# Patient Record
Sex: Male | Born: 1945 | Race: Black or African American | Hispanic: No | Marital: Married | Smoking: Former smoker
Health system: Southern US, Community
[De-identification: ages and names within clinical notes are randomized; demographics above are authoritative.]

## PROBLEM LIST (undated history)

## (undated) DIAGNOSIS — Z9289 Personal history of other medical treatment: Secondary | ICD-10-CM

## (undated) DIAGNOSIS — E785 Hyperlipidemia, unspecified: Secondary | ICD-10-CM

## (undated) DIAGNOSIS — K449 Diaphragmatic hernia without obstruction or gangrene: Secondary | ICD-10-CM

## (undated) DIAGNOSIS — R002 Palpitations: Secondary | ICD-10-CM

## (undated) DIAGNOSIS — R32 Unspecified urinary incontinence: Secondary | ICD-10-CM

## (undated) DIAGNOSIS — E119 Type 2 diabetes mellitus without complications: Secondary | ICD-10-CM

## (undated) HISTORY — DX: Type 2 diabetes mellitus without complications: E11.9

## (undated) HISTORY — PX: MENISCUS REPAIR: SHX5179

## (undated) HISTORY — DX: Hyperlipidemia, unspecified: E78.5

## (undated) HISTORY — DX: Diaphragmatic hernia without obstruction or gangrene: K44.9

## (undated) HISTORY — DX: Personal history of other medical treatment: Z92.89

## (undated) HISTORY — DX: Palpitations: R00.2

## (undated) HISTORY — DX: Unspecified urinary incontinence: R32

---
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
## 1974-01-07 HISTORY — PX: APPENDECTOMY: SHX54

## 2000-01-08 HISTORY — PX: ACHILLES TENDON REPAIR: SUR1153

## 2000-07-05 ENCOUNTER — Encounter: Payer: Self-pay | Admitting: Orthopaedic Surgery

## 2000-07-05 ENCOUNTER — Observation Stay (HOSPITAL_COMMUNITY): Admission: EM | Admit: 2000-07-05 | Discharge: 2000-07-06 | Payer: Self-pay | Admitting: *Deleted

## 2000-10-11 ENCOUNTER — Encounter: Payer: Self-pay | Admitting: Family Medicine

## 2000-10-11 ENCOUNTER — Ambulatory Visit (HOSPITAL_COMMUNITY): Admission: RE | Admit: 2000-10-11 | Discharge: 2000-10-11 | Payer: Self-pay | Admitting: Family Medicine

## 2001-02-10 HISTORY — PX: US ECHOCARDIOGRAPHY: HXRAD669

## 2003-10-24 ENCOUNTER — Emergency Department (HOSPITAL_COMMUNITY): Admission: EM | Admit: 2003-10-24 | Discharge: 2003-10-24 | Payer: Self-pay | Admitting: Emergency Medicine

## 2003-11-02 HISTORY — PX: CARDIOVASCULAR STRESS TEST: SHX262

## 2004-04-19 LAB — HM COLONOSCOPY

## 2004-12-10 ENCOUNTER — Encounter (INDEPENDENT_AMBULATORY_CARE_PROVIDER_SITE_OTHER): Payer: Self-pay | Admitting: *Deleted

## 2004-12-10 ENCOUNTER — Ambulatory Visit (HOSPITAL_COMMUNITY): Admission: RE | Admit: 2004-12-10 | Discharge: 2004-12-10 | Payer: Self-pay | Admitting: Gastroenterology

## 2004-12-10 ENCOUNTER — Encounter: Payer: Self-pay | Admitting: Family Medicine

## 2007-11-10 ENCOUNTER — Encounter: Payer: Self-pay | Admitting: Family Medicine

## 2008-04-22 ENCOUNTER — Encounter: Payer: Self-pay | Admitting: Family Medicine

## 2009-01-19 LAB — HM DIABETES EYE EXAM

## 2009-01-19 LAB — HM DIABETES FOOT EXAM

## 2009-03-01 ENCOUNTER — Encounter: Payer: Self-pay | Admitting: Family Medicine

## 2009-08-08 ENCOUNTER — Ambulatory Visit: Payer: Self-pay | Admitting: Family Medicine

## 2009-08-08 DIAGNOSIS — E785 Hyperlipidemia, unspecified: Secondary | ICD-10-CM | POA: Insufficient documentation

## 2009-08-08 DIAGNOSIS — E119 Type 2 diabetes mellitus without complications: Secondary | ICD-10-CM

## 2009-08-08 DIAGNOSIS — R32 Unspecified urinary incontinence: Secondary | ICD-10-CM | POA: Insufficient documentation

## 2009-08-08 HISTORY — DX: Hyperlipidemia, unspecified: E78.5

## 2009-08-08 HISTORY — DX: Type 2 diabetes mellitus without complications: E11.9

## 2009-08-08 HISTORY — DX: Unspecified urinary incontinence: R32

## 2009-08-08 LAB — CONVERTED CEMR LAB
Cholesterol, target level: 200 mg/dL
HDL goal, serum: 40 mg/dL
LDL Goal: 100 mg/dL

## 2009-09-22 ENCOUNTER — Ambulatory Visit: Payer: Self-pay | Admitting: Family Medicine

## 2009-09-28 LAB — CONVERTED CEMR LAB
ALT: 25 units/L (ref 0–53)
AST: 15 units/L (ref 0–37)
Albumin: 4.4 g/dL (ref 3.5–5.2)
Alkaline Phosphatase: 38 units/L — ABNORMAL LOW (ref 39–117)
Bilirubin, Direct: 0.1 mg/dL (ref 0.0–0.3)
Cholesterol: 175 mg/dL (ref 0–200)
Creatinine,U: 182 mg/dL
HDL: 35.9 mg/dL — ABNORMAL LOW (ref >39.00)
Hgb A1c MFr Bld: 6.8 % — ABNORMAL HIGH (ref 4.6–6.5)
LDL Cholesterol: 108 mg/dL — ABNORMAL HIGH (ref 0–99)
Microalb Creat Ratio: 0.3 mg/g (ref 0.0–30.0)
Microalb, Ur: 0.6 mg/dL (ref 0.0–1.9)
Total Bilirubin: 0.4 mg/dL (ref 0.3–1.2)
Total CHOL/HDL Ratio: 5
Total Protein: 7.2 g/dL (ref 6.0–8.3)
Triglycerides: 157 mg/dL — ABNORMAL HIGH (ref 0.0–149.0)
VLDL: 31.4 mg/dL (ref 0.0–40.0)

## 2009-12-04 ENCOUNTER — Ambulatory Visit: Payer: Self-pay | Admitting: Cardiovascular Disease

## 2009-12-04 ENCOUNTER — Encounter: Payer: Self-pay | Admitting: Family Medicine

## 2009-12-18 ENCOUNTER — Ambulatory Visit: Payer: Self-pay | Admitting: Family Medicine

## 2009-12-21 LAB — CONVERTED CEMR LAB
Hgb A1c MFr Bld: 6.8 % — ABNORMAL HIGH (ref 4.6–6.5)
PSA: 0.84 ng/mL (ref 0.10–4.00)

## 2009-12-25 ENCOUNTER — Ambulatory Visit: Payer: Self-pay | Admitting: Family Medicine

## 2010-02-05 ENCOUNTER — Encounter: Payer: Self-pay | Admitting: Family Medicine

## 2010-02-06 NOTE — Letter (Signed)
Summary: Todd [name] at [facility] at [facility]   Imported By: Maryln Gottron 08/23/2009 11:08:58  _____________________________________________________________________  External Attachment:    Type:   Image     Comment:   External Document

## 2010-02-06 NOTE — Letter (Signed)
Summary: Todd [name] at [facility] at [facility]   Imported By: Maryln Gottron 08/23/2009 11:06:00  _____________________________________________________________________  External Attachment:    Type:   Image     Comment:   External Document

## 2010-02-06 NOTE — Letter (Signed)
Summary: [facility]   Imported By: Maryln Gottron 12/08/2009 09:33:53  _____________________________________________________________________  External Attachment:    Type:   Image     Comment:   External Document

## 2010-02-06 NOTE — Assessment & Plan Note (Signed)
Summary: NEW TO EST//ALP   Vital Signs:  Patient profile:   65 year old male Height:      73.75 inches Weight:      212 pounds BMI:     27.50 Temp:     98.4 degrees F oral Pulse rate:   72 / minute Pulse rhythm:   regular Resp:     12 per minute BP sitting:   122 / 84  (left arm) Cuff size:   large  Vitals Entered By: Sid Falcon LPN (August 08, 2009 10:22 AM)  Nutrition Counseling: Patient's BMI is greater than 25 and therefore counseled on weight management options. CC: New to establish, Lipid Management Is Patient Diabetic? Yes Did you bring your meter with you today? No  Vision Screening:      Vision Comments: 01/2010   History of Present Illness: Here to establish care.  Previous pt of mine at [facility]. PMH-Type 2 diabetes, dyslipidemia, and urine urgency.   meds reviewed and compliant with all.  A1C 6.6% in March.  Eye exams yearly.  No hx of retinopathy, neuropathy, nephropathy. No regular exercise.  Has recently joined gym.  No regular glucose monitoring.  Diabetes dxed around 2005.  Hyperlipidemia.  Intolerant of Zocor in past.  takes Zetia and fenofibrate.  Diabetes Management History:      He has not been enrolled in the "Diabetic Education Program".  He states understanding of dietary principles and is following his diet appropriately.  No sensory loss is reported.  Self foot exams are being performed.  He is not checking home blood sugars.  He says that he is not exercising regularly.        Hypoglycemic symptoms are not occurring.  No hyperglycemic symptoms are reported.        There are no symptoms to suggest diabetic complications.    Lipid Management History:      Positive NCEP/ATP III risk factors include male age 64 years old or older and diabetes.  Negative NCEP/ATP III risk factors include no family history for ischemic heart disease, non-tobacco-user status, non-hypertensive, no ASHD (atherosclerotic heart disease), no prior stroke/TIA, no  peripheral vascular disease, and no history of aortic aneurysm.     Preventive Screening-Counseling & Management  Alcohol-Tobacco     Smoking Status: quit     Year Started: 1964     Year Quit: 1973  Caffeine-Diet-Exercise     Does Patient Exercise: no  Allergies (verified): 1)  Actifed Cold/allergy (Chlorpheniramine-Phenylephrine) 2)  Sudafed (Pseudoephedrine Hcl)  Past History:  Past Medical History: Diabetes mellitus, type II Hyperlipidemia Urinary incontinence  Past Surgical History: Appendectomy  1976 Tonsillectomy  1961 L achilles rupture 2002  Family History: Mother, mental, emotional illness Sister Type 2 diabetes  Social History: Occupation:  Research scientist (medical) Married Alcohol use-no Regular exercise-no Occupation:  employed Does Patient Exercise:  no Smoking Status:  quit  Review of Systems  The patient denies anorexia, fever, weight loss, weight gain, vision loss, chest pain, syncope, dyspnea on exertion, peripheral edema, prolonged cough, headaches, hemoptysis, abdominal pain, muscle weakness, depression, and enlarged lymph nodes.    Physical Exam  General:  Well-developed,well-nourished,in no acute distress; alert,appropriate and cooperative throughout examination Head:  Normocephalic and atraumatic without obvious abnormalities. No apparent alopecia or balding. Eyes:  pupils equal, pupils round, and pupils reactive to light.   Ears:  minimal cerumen R canal. Mouth:  Oral mucosa and oropharynx without lesions or exudates.  Teeth in good repair. Neck:  No deformities,  masses, or tenderness noted. Lungs:  Normal respiratory effort, chest expands symmetrically. Lungs are clear to auscultation, no crackles or wheezes. Heart:  normal rate and regular rhythm.   Extremities:  no edema and no foot lesions. Neurologic:  alert & oriented X3, cranial nerves II-XII intact, and strength normal in all extremities.   Skin:  no rashes and no suspicious lesions.     Cervical Nodes:  No lymphadenopathy noted Psych:  normally interactive, good eye contact, not anxious appearing, and not depressed appearing.    Diabetes Management Exam:    Foot Exam (with socks and/or shoes not present):       Sensory-Pinprick/Light touch:          Left medial foot (L-4): normal          Left dorsal foot (L-5): normal          Left lateral foot (S-1): normal          Right medial foot (L-4): normal          Right dorsal foot (L-5): normal          Right lateral foot (S-1): normal       Sensory-Monofilament:          Left foot: normal          Right foot: normal       Inspection:          Left foot: normal          Right foot: normal       Nails:          Left foot: normal          Right foot: normal    Eye Exam:       Eye Exam done elsewhere          Date: 01/11/2009          Results: normal          Done by: dr Elisabeth Most   Impression & Recommendations:  Problem # 1:  DIABETES MELLITUS, TYPE II (ICD-250.00) schedule labs. His updated medication list for this problem includes:    Lisinopril 10 Mg Tabs (Lisinopril) ..... Once daily for blood pressure    Metformin Hcl 500 Mg Tabs (Metformin hcl) ..... Once daily  for diabetes    Aspirin 81 Mg Tabs (Aspirin) ..... Once daily  Problem # 2:  HYPERLIPIDEMIA (ICD-272.4) schedule labs. His updated medication list for this problem includes:    Trilipix 135 Mg Cpdr (Choline fenofibrate) ..... Once daily for cholesterol    Zetia 10 Mg Tabs (Ezetimibe) ..... Once daily for cholesterol  Problem # 3:  URINARY INCONTINENCE (ICD-788.30)  Complete Medication List: 1)  Lisinopril 10 Mg Tabs (Lisinopril) .... Once daily for blood pressure 2)  Trilipix 135 Mg Cpdr (Choline fenofibrate) .... Once daily for cholesterol 3)  Metformin Hcl 500 Mg Tabs (Metformin hcl) .... Once daily  for diabetes 4)  Zetia 10 Mg Tabs (Ezetimibe) .... Once daily for cholesterol 5)  Vesicare 10 Mg Tabs (Solifenacin succinate) .... Once  daily overactive bladder 6)  Aspirin 81 Mg Tabs (Aspirin) .... Once daily 7)  Fiber 625 Mg Tabs (Calcium polycarbophil) .... Once daily  Diabetes Management Assessment/Plan:      The following lipid goals have been established for the patient: Total cholesterol goal of 200; LDL cholesterol goal of 100; HDL cholesterol goal of 40; Triglyceride goal of 150.  His blood pressure goal is < 130/80.  Lipid Assessment/Plan:      Based on NCEP/ATP III, the patient's risk factor category is "history of diabetes".  The patient's lipid goals are as follows: Total cholesterol goal is 200; LDL cholesterol goal is 100; HDL cholesterol goal is 40; Triglyceride goal is 150.    Patient Instructions: 1)  Schedule the following labs in one month: 2)  A1C 250.00 3)  Lipid/hepatic panels  272.4 4)  urine microalbumin  250.00 5)  It is important that you exercise reguarly at least 20 minutes 5 times a week. If you develop chest pain, have severe difficulty breathing, or feel very tired, stop exercising immediately and seek medical attention.  6)  Check your blood sugars regularly. If your readings are usually above:140 fasting  or below 70 you should contact our office.  7)  It is important that your diabetic A1c level is checked every 3 months.  8)  See your eye doctor yearly to check for diabetic eye damage. 9)  Check your feet each night  for sore areas, calluses or signs of infection.   Preventive Care Screening  Colonoscopy:    Date:  12/07/2004    Results:  Adenomatous Polyp     Preventive Care Screening  Colonoscopy:    Date:  12/07/2004    Results:  Adenomatous Polyp

## 2010-02-08 NOTE — Assessment & Plan Note (Signed)
Summary: ROA/FUP/RCD   Vital Signs:  Patient profile:   65 year old male Weight:      210 pounds Temp:     97.8 degrees F oral BP sitting:   130 / 82  (left arm) Cuff size:   large  Vitals Entered By: Sid Falcon LPN (December 25, 2009 8:29 AM) CC: 4 month follow-up, Lipid Management Is Patient Diabetic? Yes   History of Present Illness: Patient here for medical followup. History type 2 diabetes, hyperlipidemia, and urine urgency.  Recent A1c 6.8%. Blood sugars stable. Weight loss since last visit from dietary change. Not exercising regularly. Eye exam due in January.  History adenomatous polyp 2006. Scheduled for followup with her gastroenterology in January.  Urine urgency controlled with Vesicare.  No constipation or dry mouth.   Lipids have been well controlled.  No hx of CAD.  Has already received flu vaccine.  Diabetes Management History:      He has not been enrolled in the "Diabetic Education Program".  He states understanding of dietary principles and is following his diet appropriately.  No sensory loss is reported.  Self foot exams are being performed.  He is not checking home blood sugars.  He says that he is not exercising regularly.        Hypoglycemic symptoms are not occurring.  No hyperglycemic symptoms are reported.        No changes have been made to his treatment plan since last visit.    Lipid Management History:      Positive NCEP/ATP III risk factors include male age 24 years old or older, diabetes, and HDL cholesterol less than 40.  Negative NCEP/ATP III risk factors include no family history for ischemic heart disease, non-tobacco-user status, non-hypertensive, no ASHD (atherosclerotic heart disease), no prior stroke/TIA, no peripheral vascular disease, and no history of aortic aneurysm.     Clinical Review Panels:  Prevention   Last Colonoscopy:  Adenomatous Polyp (12/07/2004)   Last PSA:  0.84 (12/18/2009)  Immunizations   Last Flu Vaccine:   Fluvax 3+ (09/22/2009)   Allergies: 1)  Actifed Cold/allergy (Chlorpheniramine-Phenylephrine) 2)  Sudafed (Pseudoephedrine Hcl)  Past History:  Past Medical History: Last updated: 08/08/2009 Diabetes mellitus, type II Hyperlipidemia Urinary incontinence  Past Surgical History: Last updated: 08/08/2009 Appendectomy  1976 Tonsillectomy  1961 L achilles rupture 2002  Family History: Last updated: 08/08/2009 Mother, mental, emotional illness Sister Type 2 diabetes  Social History: Last updated: 08/08/2009 Occupation:  Research scientist (medical) Married Alcohol use-no Regular exercise-no  Risk Factors: Exercise: no (08/08/2009)  Risk Factors: Smoking Status: quit (08/08/2009)  Review of Systems  The patient denies anorexia, fever, weight gain, vision loss, hoarseness, chest pain, syncope, dyspnea on exertion, peripheral edema, prolonged cough, headaches, hemoptysis, abdominal pain, melena, hematochezia, and severe indigestion/heartburn.    Physical Exam  General:  Well-developed,well-nourished,in no acute distress; alert,appropriate and cooperative throughout examination Eyes:  No corneal or conjunctival inflammation noted. EOMI. Perrla. Funduscopic exam benign, without hemorrhages, exudates or papilledema. Vision grossly normal. Mouth:  Oral mucosa and oropharynx without lesions or exudates.  Teeth in good repair. Neck:  No deformities, masses, or tenderness noted. Lungs:  Normal respiratory effort, chest expands symmetrically. Lungs are clear to auscultation, no crackles or wheezes. Heart:  Normal rate and regular rhythm. S1 and S2 normal without gallop, murmur, click, rub or other extra sounds. Extremities:  No clubbing, cyanosis, edema, or deformity noted with normal full range of motion of all joints.   Psych:  normally interactive, good  eye contact, not anxious appearing, and not depressed appearing.    Diabetes Management Exam:    Foot Exam (with socks and/or shoes not  present):       Sensory-Pinprick/Light touch:          Left medial foot (L-4): normal          Left dorsal foot (L-5): normal          Left lateral foot (S-1): normal          Right medial foot (L-4): normal          Right dorsal foot (L-5): normal          Right lateral foot (S-1): normal       Sensory-Monofilament:          Left foot: normal          Right foot: normal       Inspection:          Left foot: normal          Right foot: normal       Nails:          Left foot: normal          Right foot: normal   Impression & Recommendations:  Problem # 1:  DIABETES MELLITUS, TYPE II (ICD-250.00)  His updated medication list for this problem includes:    Lisinopril 10 Mg Tabs (Lisinopril) ..... Once daily for blood pressure    Metformin Hcl 500 Mg Tabs (Metformin hcl) ..... Once daily  for diabetes    Aspirin 81 Mg Tabs (Aspirin) ..... Once daily  Problem # 2:  HYPERLIPIDEMIA (ICD-272.4)  His updated medication list for this problem includes:    Trilipix 135 Mg Cpdr (Choline fenofibrate) ..... Once daily for cholesterol    Zetia 10 Mg Tabs (Ezetimibe) ..... Once daily for cholesterol  Problem # 3:  URINARY INCONTINENCE (ICD-788.30)  Complete Medication List: 1)  Lisinopril 10 Mg Tabs (Lisinopril) .... Once daily for blood pressure 2)  Trilipix 135 Mg Cpdr (Choline fenofibrate) .... Once daily for cholesterol 3)  Metformin Hcl 500 Mg Tabs (Metformin hcl) .... Once daily  for diabetes 4)  Zetia 10 Mg Tabs (Ezetimibe) .... Once daily for cholesterol 5)  Vesicare 10 Mg Tabs (Solifenacin succinate) .... Once daily overactive bladder 6)  Aspirin 81 Mg Tabs (Aspirin) .... Once daily 7)  Fiber 625 Mg Tabs (Calcium polycarbophil) .... Once daily  Diabetes Management Assessment/Plan:      The following lipid goals have been established for the patient: Total cholesterol goal of 200; LDL cholesterol goal of 100; HDL cholesterol goal of 40; Triglyceride goal of 150.  His blood  pressure goal is < 130/80.    Lipid Assessment/Plan:      Based on NCEP/ATP III, the patient's risk factor category is "history of diabetes".  The patient's lipid goals are as follows: Total cholesterol goal is 200; LDL cholesterol goal is 100; HDL cholesterol goal is 40; Triglyceride goal is 150.    Patient Instructions: 1)  Please schedule a follow-up appointment in 6 months .  2)  It is important that you exercise reguarly at least 20 minutes 5 times a week. If you develop chest pain, have severe difficulty breathing, or feel very tired, stop exercising immediately and seek medical attention.  3)  See your eye doctor yearly to check for diabetic eye damage. 4)  Check your feet each night  for sore areas, calluses or signs of infection.  Orders Added: 1)  Est. Patient Level IV [id number]

## 2010-02-14 NOTE — Letter (Signed)
Summary: Eye Exam/[facility]  Eye Exam/[facility]   Imported By: Maryln Gottron 02/09/2010 13:36:25  _____________________________________________________________________  External Attachment:    Type:   Image     Comment:   External Document

## 2010-02-27 ENCOUNTER — Telehealth: Payer: Self-pay | Admitting: Family Medicine

## 2010-02-27 MED ORDER — METFORMIN HCL 500 MG PO TABS: 500.0000 mg | ORAL_TABLET | Freq: Every day | ORAL | Status: DC

## 2010-02-27 NOTE — Telephone Encounter (Signed)
Pt needs refill for metformin 500mg  - 90 day supply.... [facility] [address]...Marland KitchenMarland KitchenMarland Kitchen # [phone].

## 2010-02-28 ENCOUNTER — Telehealth: Payer: Self-pay | Admitting: Family Medicine

## 2010-02-28 MED ORDER — METFORMIN HCL ER 500 MG PO TB24: 500.0000 mg | ORAL_TABLET | Freq: Every day | ORAL | Status: DC

## 2010-02-28 NOTE — Telephone Encounter (Signed)
Pt called in today and said that Metformin 500mg  (plain) was called in. He stated that he is on the ER 500mg  1qd #90. Please call in the correct one to [facility] on [address]. Please call him when done.

## 2010-04-03 ENCOUNTER — Encounter: Payer: Self-pay | Admitting: Family Medicine

## 2010-04-20 ENCOUNTER — Encounter: Payer: Self-pay | Admitting: Family Medicine

## 2010-04-20 ENCOUNTER — Ambulatory Visit (INDEPENDENT_AMBULATORY_CARE_PROVIDER_SITE_OTHER): Payer: PRIVATE HEALTH INSURANCE | Admitting: Family Medicine

## 2010-04-20 VITALS — BP 122/80 | Temp 98.5°F | Wt 228.0 lb

## 2010-04-20 DIAGNOSIS — IMO0001 Reserved for inherently not codable concepts without codable children: Secondary | ICD-10-CM

## 2010-04-20 DIAGNOSIS — M791 Myalgia, unspecified site: Secondary | ICD-10-CM

## 2010-04-20 LAB — CBC WITH DIFFERENTIAL/PLATELET
Basophils Absolute: 0 10*3/uL (ref 0.0–0.1)
Basophils Relative: 0.4 % (ref 0.0–3.0)
Eosinophils Absolute: 0.1 10*3/uL (ref 0.0–0.7)
Eosinophils Relative: 1.2 % (ref 0.0–5.0)
HCT: 42.4 % (ref 39.0–52.0)
Hemoglobin: 14.4 g/dL (ref 13.0–17.0)
Lymphocytes Relative: 36.7 % (ref 12.0–46.0)
Lymphs Abs: 2.1 10*3/uL (ref 0.7–4.0)
MCHC: 34 g/dL (ref 30.0–36.0)
MCV: 86.9 fl (ref 78.0–100.0)
Monocytes Absolute: 0.6 10*3/uL (ref 0.1–1.0)
Monocytes Relative: 9.6 % (ref 3.0–12.0)
Neutro Abs: 3 10*3/uL (ref 1.4–7.7)
Neutrophils Relative %: 52.1 % (ref 43.0–77.0)
Platelets: 255 10*3/uL (ref 150.0–400.0)
RBC: 4.88 Mil/uL (ref 4.22–5.81)
RDW: 14.3 % (ref 11.5–14.6)
WBC: 5.7 10*3/uL (ref 4.5–10.5)

## 2010-04-20 LAB — POCT SEDIMENTATION RATE: POCT SED RATE: 10 mm/hr (ref 0–22)

## 2010-04-20 LAB — CK: Total CK: 194 U/L (ref 7–232)

## 2010-04-20 NOTE — Progress Notes (Addendum)
  Subjective:    Patient ID: Todd [name], male    DOB: 08-23-1945, 65 y.o.   MRN: [id number]  HPI Agency with diffuse pain upper back and neck radiating toward both shoulders and even upper arms past week. No injury. No radiculopathy symptoms. Describes as a dull ache. No associated weakness. No numbness. Generally poor sleep quality. Felt similar previously with statin medication. Currently takes Avandia and try lipid and a statin. Patient tried coenzyme Q10 without improvement.   Review of Systems  Constitutional: Negative for fever, chills, activity change, appetite change and fatigue.  Respiratory: Negative for cough and shortness of breath.   Cardiovascular: Negative for chest pain, palpitations and leg swelling.  Musculoskeletal: Positive for myalgias. Negative for joint swelling and arthralgias.  Skin: Negative for rash.       Objective:   Physical Exam  Constitutional: He is oriented to person, place, and time. He appears well-developed and well-nourished. No distress.  Neck: Normal range of motion. Neck supple. No thyromegaly present.  Cardiovascular: Normal rate, regular rhythm and normal heart sounds.   No murmur heard. Pulmonary/Chest: Effort normal and breath sounds normal. No respiratory distress. He has no wheezes. He has no rales.  Musculoskeletal: He exhibits no edema.  Lymphadenopathy:    He has no cervical adenopathy.  Neurological: He is alert and oriented to person, place, and time. No cranial nerve deficit.          Assessment & Plan:  Myalgias mostly involving upper back and shoulders and upper arms. Doubt polymyalgia rheumatica but check sedimentation rate. Patient takes no statins but is taking ZE TIA and Trilipix. Check creatinine kinase and CBC. Consider low-dose muscle relaxer if above labs normal  Labs all normal.  Start low dose Flexeril 5 mg po qhs and if no better in 1-2 weeks consider physical therapy.

## 2010-04-24 NOTE — Progress Notes (Signed)
Quick Note:  Pt informed. He requested Dr Caryl Never be informed he is no better...looking for directives of what he could/should do next ______

## 2010-05-20 ENCOUNTER — Other Ambulatory Visit: Payer: Self-pay | Admitting: Family Medicine

## 2010-05-25 NOTE — Op Note (Signed)
[place]  Patient:    Todd [name], Todd [name]                           MRN: [id number] Proc. Date: 07/05/00 Attending:  Claude Manges. Cleophas Dunker, M.D.                           Operative Report  PREOPERATIVE DIAGNOSIS:  Rupture of left Achilles tendon.  POSTOPERATIVE DIAGNOSIS: Rupture of left Achilles tendon.  OPERATION:  Primary repair.  SURGEON:  Claude Manges. Cleophas Dunker, M.D.  ANESTHESIA:  General orotracheal.  COMPLICATIONS:  None.  HISTORY:  A 65 year old gentleman playing basketball this morning felt a burning sensation just above his left heel.  He was seen at [facility] with diagnosis of a ruptured Achilles tendon.  He as sent to the emergency room for my evaluation confirming the diagnosis.  He is now to have primary repair.  DESCRIPTION OF PROCEDURE:  With the patient comfortable on the operating room stretcher and under general orotracheal anesthesia, tourniquet was applied to the left lower extremity.  The patient was then placed on the operating table in the prone position.  The left lower extremity was prepped with DuraPrep from the tips of the toes to the knee.  Sterile dressing was performed.  With the extremity still elevated, it was Esmarch exsanguinated with the proximal tourniquet at 350 mmHg.  A longitudinal incision was made medial to the Achilles tendon approximately 3 to 4 inches in length, centered over the area of rupture which was approximately 2 inches proximal to the attachment to the Achilles and the os calcis.  With sharp dissection, the incision was carried down to the subcutaneous tissue.  I then incised the Achilles tendon sheath longitudinally.  At that point, there was unclotted blood which was irrigated.  The sheath was retracted medially and laterally.  There was approximately an inch gap between the frayed edges.  The plantaris tendon was still intact.  Using a #1 Ethibond suture and using a Tajima  stitch, both the proximal and distal edges were secured.  With the foot in plantar flexion, the two ends were repaired medially and laterally with an excellent repair.  The stitches were buried.  As I would flex the foot to about neutral, there was not obvious tension or elongation of the repair site.  I supplemented the repair site with 2-0 Vicryl circumferentially with a very good technical repair.  The repair was further supplemented with the plantaris tendon.  It was incised proximally, woven through the normal tendon proximal to the incision, and then secured to the distal portion of the Achilles tendon repair on the lateral side with 2-0 Vicryl.  The wound was then irrigated.  There was a fat pad overlying the tendon that was repaired anatomically, and then I repaired the Achilles tendon sheath anatomically with 4-0 Vicryl.  Subcutaneous tissue was closed with 4-0 Vicryl and skin closed with skin clips.  Sterile bulky dressing was applied prior to which 0.25% Marcaine without epinephrine was injected into the edges.  With the foot in plantar flexion, the posterior splint was applied with 5 x 30 splints and Ace bandage.  Tourniquet was deflated in approximately 30 minutes.  The patient tolerated the procedure without complications. DD:  07/05/00 TD:  07/05/00 Job: [id number]

## 2010-05-25 NOTE — Op Note (Signed)
NAME:  Todd [name], Todd [name]                  ACCOUNT NO.:  [id number]   MEDICAL RECORD NO.:  [id number]          PATIENT TYPE:  AMB   LOCATION:  ENDO                         FACILITY:  [facility]   PHYSICIAN:  Anselmo Rod, M.D.  DATE OF BIRTH:  1945-04-02   DATE OF PROCEDURE:  12/10/2004  DATE OF DISCHARGE:                                 OPERATIVE REPORT   PROCEDURE:  Colonoscopy with cold biopsy x 2.   ENDOSCOPIST:  Charna Elizabeth, M.D.   INSTRUMENT USED:  Olympus video colonoscope.   INDICATIONS FOR PROCEDURE:  65 year old African American male undergoing  screening colonoscopy to rule out colonic polyps, masses, etc.   PREPROCEDURE PREPARATION:  Informed consent was obtained from the patient.  The patient was fasted for four hours prior to the procedure and prepped  with Osmo prep pills the night of and the morning of the procedure.  The  risks and benefits of the procedure including a 10% miss rate of cancer and  polyps were discussed with the patient, as well.   PREPROCEDURE PHYSICAL:  Patient with stable vital signs.  Neck supple.  Chest clear to auscultation.  S1 and S2 regular.  Abdomen soft with normal  bowel sounds.   DESCRIPTION OF PROCEDURE:  The patient was placed in the left lateral  decubitus position, sedated with 75 mcg Fentanyl and 7.5 mg Versed in slow  incremental doses.  Once the patient was adequately sedated, maintained on  low flow oxygen and continuous cardiac monitoring, the Olympus video  colonoscope was advanced from the rectum to the cecum.  The appendiceal  orifice and ileocecal valve were visualized and photographed.  The terminal  ileum appeared normal without lesions.  A few early sigmoid diverticula were  seen.  A small sessile polyp was removed by core biopsies x 2 from the  distal right colon.  The rest of the exam was unremarkable.  Retroflexion in  the rectum revealed no abnormalities.  The patient tolerated the procedure  well without immediate  complications.   IMPRESSION:  1.A few early sigmoid diverticula.  2.Small sessile polyp biopsied from distal right colon (core biopsies x 2).  3.Otherwise, normal exam.   RECOMMENDATIONS:  1.Await pathology results.  2.Repeat colonoscopy depending on pathology results.  3.Avoid nonsteroidals including aspirin for the next two weeks.  4.Outpatient follow up as need arises in the future.      Anselmo Rod, M.D.  Electronically Signed     JNM/MEDQ  D:  12/10/2004  T:  12/10/2004  Job:  [id number]   cc:   Teena Irani. Arlyce Dice, M.D.  Fax: [phone]

## 2010-05-31 ENCOUNTER — Other Ambulatory Visit: Payer: Self-pay | Admitting: Family Medicine

## 2010-06-18 ENCOUNTER — Telehealth: Payer: Self-pay | Admitting: Family Medicine

## 2010-06-18 NOTE — Telephone Encounter (Signed)
Go ahead and schedule lipid, hepatic, and A1C.  He had PSA 6 months ago so this does not need repeating yet.

## 2010-06-18 NOTE — Telephone Encounter (Signed)
Pt is sch for fup on 07/04/10. Pt is req to get lab work done prior to visit to test psa, cholesterol,a1c. Pls advise.

## 2010-06-20 NOTE — Telephone Encounter (Signed)
Pt has been called and sch for labs on 06/29/10, as noted.

## 2010-06-25 ENCOUNTER — Ambulatory Visit: Payer: Self-pay | Admitting: Family Medicine

## 2010-06-29 ENCOUNTER — Other Ambulatory Visit (INDEPENDENT_AMBULATORY_CARE_PROVIDER_SITE_OTHER): Payer: PRIVATE HEALTH INSURANCE

## 2010-06-29 DIAGNOSIS — T887XXA Unspecified adverse effect of drug or medicament, initial encounter: Secondary | ICD-10-CM

## 2010-06-29 DIAGNOSIS — IMO0001 Reserved for inherently not codable concepts without codable children: Secondary | ICD-10-CM

## 2010-06-29 DIAGNOSIS — E785 Hyperlipidemia, unspecified: Secondary | ICD-10-CM

## 2010-06-29 LAB — HEPATIC FUNCTION PANEL
ALT: 33 U/L (ref 0–53)
AST: 23 U/L (ref 0–37)
Albumin: 4.3 g/dL (ref 3.5–5.2)
Alkaline Phosphatase: 45 U/L (ref 39–117)
Bilirubin, Direct: 0.1 mg/dL (ref 0.0–0.3)
Total Bilirubin: 0.5 mg/dL (ref 0.3–1.2)
Total Protein: 6.9 g/dL (ref 6.0–8.3)

## 2010-06-29 LAB — LIPID PANEL
Cholesterol: 158 mg/dL (ref 0–200)
HDL: 35.8 mg/dL — ABNORMAL LOW (ref >39.00)
LDL Cholesterol: 98 mg/dL (ref 0–99)
Total CHOL/HDL Ratio: 4
Triglycerides: 123 mg/dL (ref 0.0–149.0)
VLDL: 24.6 mg/dL (ref 0.0–40.0)

## 2010-06-29 LAB — HEMOGLOBIN A1C: Hgb A1c MFr Bld: 7.1 % — ABNORMAL HIGH (ref 4.6–6.5)

## 2010-07-03 ENCOUNTER — Ambulatory Visit: Payer: Self-pay | Admitting: Family Medicine

## 2010-07-04 ENCOUNTER — Encounter: Payer: Self-pay | Admitting: Family Medicine

## 2010-07-04 ENCOUNTER — Ambulatory Visit (INDEPENDENT_AMBULATORY_CARE_PROVIDER_SITE_OTHER): Payer: PRIVATE HEALTH INSURANCE | Admitting: Family Medicine

## 2010-07-04 DIAGNOSIS — E119 Type 2 diabetes mellitus without complications: Secondary | ICD-10-CM

## 2010-07-04 DIAGNOSIS — R32 Unspecified urinary incontinence: Secondary | ICD-10-CM

## 2010-07-04 DIAGNOSIS — E785 Hyperlipidemia, unspecified: Secondary | ICD-10-CM

## 2010-07-04 MED ORDER — FENOFIBRATE 160 MG PO TABS: 160.0000 mg | ORAL_TABLET | Freq: Every day | ORAL | Status: DC

## 2010-07-04 MED ORDER — PRAVASTATIN SODIUM 20 MG PO TABS: 20.0000 mg | ORAL_TABLET | Freq: Every day | ORAL | Status: DC

## 2010-07-04 NOTE — Patient Instructions (Signed)
Stop Zetia and start Pravachol. Stop Vesicare and start Toviaz one daily.

## 2010-07-04 NOTE — Progress Notes (Signed)
  Subjective:    Patient ID: Todd [name], male    DOB: 08/30/1945, 65 y.o.   MRN: [id number]  HPI Followup several medical problems. Type 2 diabetes, hyperlipidemia and urge urine incontinence. Very little effect with VESIcare. Would like to explore other options. No obstructive symptoms. No major constipation or dry mouth issues.  Type 2 diabetes. Recent A1c 7.1%. Does not monitor sugars regularly. No history of complications. Takes metformin 500 mg daily. No consistent exercise.  History of dyslipidemia. Previous intolerance to Lipitor and simvastatin. Currently takes Zetia 10 mg daily. Nonsmoker   Review of Systems  Constitutional: Negative for unexpected weight change.  Respiratory: Negative for cough, shortness of breath and wheezing.   Cardiovascular: Negative for chest pain, palpitations and leg swelling.  Genitourinary: Positive for urgency and frequency. Negative for dysuria and difficulty urinating.  Neurological: Negative for dizziness and headaches.  Hematological: Negative for adenopathy.       Objective:   Physical Exam  Constitutional: He is oriented to person, place, and time. He appears well-developed and well-nourished. No distress.  HENT:  Right Ear: External ear normal.  Left Ear: External ear normal.  Mouth/Throat: Oropharyngeal exudate present.  Neck: Neck supple. No thyromegaly present.  Cardiovascular: Regular rhythm and normal heart sounds.   No murmur heard. Pulmonary/Chest: Effort normal and breath sounds normal. No respiratory distress. He has no wheezes. He has no rales.  Musculoskeletal: He exhibits no edema.  Lymphadenopathy:    He has no cervical adenopathy.  Neurological: He is alert and oriented to person, place, and time. No cranial nerve deficit.  Psychiatric: He has a normal mood and affect. His behavior is normal.          Assessment & Plan:  #1 type 2 diabetes. Recent A1c 7.1%. Work on weight loss and reassess 3 months.  Titrate  metformin if not at goal #2 dyslipidemia. Switch trilipix to generic fenofibrate.  Pravachol and stop Zetia and retest lipids 3 months #3 history of urge urine incontinence. No obstructive symptoms. Discontinue VESIcare. Trial of Toviaz 4 mg at night

## 2010-08-15 ENCOUNTER — Other Ambulatory Visit: Payer: Self-pay | Admitting: Family Medicine

## 2010-08-15 MED ORDER — AZITHROMYCIN 250 MG PO TABS: ORAL_TABLET | ORAL | Status: AC

## 2010-08-15 NOTE — Telephone Encounter (Signed)
Ok to refill once

## 2010-08-15 NOTE — Telephone Encounter (Signed)
Please advise 

## 2010-08-15 NOTE — Telephone Encounter (Signed)
Pt called and is req a renewal of zpak. Pls call in to [facility] in [place], [place] [phone]. Pt is out of town and has chest congestion.

## 2010-08-15 NOTE — Telephone Encounter (Signed)
[facility] [place]

## 2010-09-03 ENCOUNTER — Ambulatory Visit (INDEPENDENT_AMBULATORY_CARE_PROVIDER_SITE_OTHER): Payer: PRIVATE HEALTH INSURANCE | Admitting: Family Medicine

## 2010-09-03 ENCOUNTER — Encounter: Payer: Self-pay | Admitting: Family Medicine

## 2010-09-03 DIAGNOSIS — R202 Paresthesia of skin: Secondary | ICD-10-CM

## 2010-09-03 DIAGNOSIS — IMO0001 Reserved for inherently not codable concepts without codable children: Secondary | ICD-10-CM

## 2010-09-03 DIAGNOSIS — E119 Type 2 diabetes mellitus without complications: Secondary | ICD-10-CM

## 2010-09-03 DIAGNOSIS — R209 Unspecified disturbances of skin sensation: Secondary | ICD-10-CM

## 2010-09-03 DIAGNOSIS — E785 Hyperlipidemia, unspecified: Secondary | ICD-10-CM

## 2010-09-03 DIAGNOSIS — M791 Myalgia, unspecified site: Secondary | ICD-10-CM

## 2010-09-03 NOTE — Progress Notes (Signed)
  Subjective:    Patient ID: Todd [name], male    DOB: Jun 01, 1945, 65 y.o.   MRN: [id number]  HPI Patient here for several issues. Recent muscle aches neck, shoulders, trunk. Started after initiation of medication changes. Initially he thought this was secondary to Toviaz but symptoms did not stop after stopping medication. He recently started pravastatin. Previously intolerant to other statins such as Lipitor.  Patient has hx of urine urgency. Not relieved with VESIcare 10 mg or recent trial of Toviaz.  No obstructive urinary symptoms.  Patient type 2 diabetes. Marginal control. Recent dysesthesias and tingling sensation of feet. No real pain. No weakness. Upcoming labs. No recent TSH or B12.  Past Medical History  Diagnosis Date  . DIABETES MELLITUS, TYPE II 08/08/2009  . HYPERLIPIDEMIA 08/08/2009  . URINARY INCONTINENCE 08/08/2009   Past Surgical History  Procedure Date  . Appendectomy 1976  . Tonsillectomy 1961  . Achilles tendon repair 2002    rupture    reports that he quit smoking about 33 years ago. His smoking use included Cigarettes. He has a 20 pack-year smoking history. He does not have any smokeless tobacco history on file. His alcohol and drug histories not on file. family history includes Diabetes in his sister and Mental illness in his mother. Allergies  Allergen Reactions  . Actifed Cold-Sinus     rash  . Pseudoephedrine     REACTION: hives      Review of Systems  Constitutional: Negative for fever, chills and appetite change.  Eyes: Negative for visual disturbance.  Respiratory: Negative for shortness of breath.   Cardiovascular: Negative for chest pain, palpitations and leg swelling.  Musculoskeletal: Positive for myalgias. Negative for joint swelling.  Neurological: Positive for numbness. Negative for dizziness and weakness.  Hematological: Negative for adenopathy.       Objective:   Physical Exam  Constitutional: He is oriented to person, place, and time.  He appears well-developed and well-nourished.  Neck: Neck supple.  Cardiovascular: Normal rate, regular rhythm and normal heart sounds.   No murmur heard. Pulmonary/Chest: Effort normal and breath sounds normal. No respiratory distress. He has no wheezes. He has no rales.  Musculoskeletal: He exhibits no edema.  Lymphadenopathy:    He has no cervical adenopathy.  Neurological: He is alert and oriented to person, place, and time.          Assessment & Plan:  #1 urinary urgency. Discontinue VESIcare. 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 Add TSH, B12 to upcoming labs #3 myalgias. Probably secondary to pravastatin.  Hold pravastatin and reassess at followup #4 hypertension stable

## 2010-09-03 NOTE — Patient Instructions (Signed)
Enablex one daily Hold Pravastatin until follow up.

## 2010-09-14 ENCOUNTER — Other Ambulatory Visit: Payer: Self-pay | Admitting: Family Medicine

## 2010-10-01 ENCOUNTER — Other Ambulatory Visit (INDEPENDENT_AMBULATORY_CARE_PROVIDER_SITE_OTHER): Payer: PRIVATE HEALTH INSURANCE

## 2010-10-01 DIAGNOSIS — L4050 Arthropathic psoriasis, unspecified: Secondary | ICD-10-CM

## 2010-10-01 DIAGNOSIS — N390 Urinary tract infection, site not specified: Secondary | ICD-10-CM

## 2010-10-01 DIAGNOSIS — R202 Paresthesia of skin: Secondary | ICD-10-CM

## 2010-10-01 DIAGNOSIS — E785 Hyperlipidemia, unspecified: Secondary | ICD-10-CM

## 2010-10-01 DIAGNOSIS — R209 Unspecified disturbances of skin sensation: Secondary | ICD-10-CM

## 2010-10-01 DIAGNOSIS — E119 Type 2 diabetes mellitus without complications: Secondary | ICD-10-CM

## 2010-10-01 LAB — HEPATIC FUNCTION PANEL
ALT: 24 U/L (ref 0–53)
AST: 17 U/L (ref 0–37)
Albumin: 4.1 g/dL (ref 3.5–5.2)
Alkaline Phosphatase: 45 U/L (ref 39–117)
Bilirubin, Direct: 0.1 mg/dL (ref 0.0–0.3)
Total Bilirubin: 0.5 mg/dL (ref 0.3–1.2)
Total Protein: 6.9 g/dL (ref 6.0–8.3)

## 2010-10-01 LAB — PSA: PSA: 0.86 ng/mL (ref 0.10–4.00)

## 2010-10-01 LAB — LIPID PANEL
Cholesterol: 159 mg/dL (ref 0–200)
HDL: 33.9 mg/dL — ABNORMAL LOW (ref >39.00)
LDL Cholesterol: 101 mg/dL — ABNORMAL HIGH (ref 0–99)
Total CHOL/HDL Ratio: 5
Triglycerides: 123 mg/dL (ref 0.0–149.0)
VLDL: 24.6 mg/dL (ref 0.0–40.0)

## 2010-10-01 LAB — POCT URINALYSIS DIPSTICK
Bilirubin, UA: NEGATIVE
Blood, UA: NEGATIVE
Glucose, UA: NEGATIVE
Ketones, UA: NEGATIVE
Leukocytes, UA: NEGATIVE
Nitrite, UA: NEGATIVE
Protein, UA: NEGATIVE
Spec Grav, UA: 1.02
Urobilinogen, UA: 0.2
pH, UA: 5.5

## 2010-10-01 LAB — VITAMIN B12: Vitamin B-12: 448 pg/mL (ref 211–911)

## 2010-10-01 LAB — TSH: TSH: 1.53 u[IU]/mL (ref 0.35–5.50)

## 2010-10-01 LAB — HEMOGLOBIN A1C: Hgb A1c MFr Bld: 6.8 % — ABNORMAL HIGH (ref 4.6–6.5)

## 2010-10-08 ENCOUNTER — Ambulatory Visit (INDEPENDENT_AMBULATORY_CARE_PROVIDER_SITE_OTHER): Payer: PRIVATE HEALTH INSURANCE | Admitting: Family Medicine

## 2010-10-08 ENCOUNTER — Encounter: Payer: Self-pay | Admitting: Family Medicine

## 2010-10-08 VITALS — BP 122/78 | Temp 98.1°F | Wt 214.0 lb

## 2010-10-08 DIAGNOSIS — M25519 Pain in unspecified shoulder: Secondary | ICD-10-CM

## 2010-10-08 DIAGNOSIS — M25512 Pain in left shoulder: Secondary | ICD-10-CM

## 2010-10-08 DIAGNOSIS — IMO0001 Reserved for inherently not codable concepts without codable children: Secondary | ICD-10-CM

## 2010-10-08 DIAGNOSIS — M791 Myalgia, unspecified site: Secondary | ICD-10-CM

## 2010-10-08 DIAGNOSIS — M25511 Pain in right shoulder: Secondary | ICD-10-CM

## 2010-10-08 DIAGNOSIS — Z23 Encounter for immunization: Secondary | ICD-10-CM

## 2010-10-08 DIAGNOSIS — R32 Unspecified urinary incontinence: Secondary | ICD-10-CM

## 2010-10-08 NOTE — Patient Instructions (Signed)
Get back on pravastatin Discontinue Enablex and restart VESIcare 10 mg daily Schedule followup with urologist and if you have any difficulties getting appointment let us know and we can assist

## 2010-10-08 NOTE — Progress Notes (Signed)
  Subjective:    Patient ID: Todd [name], male    DOB: Nov 14, 1945, 65 y.o.   MRN: [id number]  HPI Patient seen for medical followup. Recent lab work unremarkable. Myalgias did not decrease with stopping pravastatin. Thyroid and B12 normal.  Patient still has some urine frequency. We tried Enablex and he previously tried VESIcare without much improvement. He denies any burning with urination. No caffeine use. No significant alcohol use. No obstructive urinary symptoms. Saw a urologist about 2 years ago who placed him initially on VESIcare. No hematuria.  Complains of some bilateral shoulder pains intermittently. Occasional night pain. No weakness. Minimal pain with internal rotation and abduction. Not limiting range of motion or activity   Review of Systems  Constitutional: Negative for fever, chills and unexpected weight change.  Respiratory: Negative for shortness of breath.   Cardiovascular: Negative for chest pain.  Gastrointestinal: Negative for abdominal pain.  Genitourinary: Positive for urgency. Negative for hematuria, decreased urine volume, difficulty urinating and testicular pain.  Neurological: Negative for dizziness.       Objective:   Physical Exam  Constitutional: He is oriented to person, place, and time. He appears well-developed and well-nourished.  Cardiovascular: Normal rate and regular rhythm.   Pulmonary/Chest: Effort normal and breath sounds normal. No respiratory distress. He has no wheezes. He has no rales.  Musculoskeletal: He exhibits no edema.       Full range of motion both shoulders. No tenderness. No muscle atrophy. No rotator cuff weakness. Minimal pain with abduction against resistance  Neurological: He is alert and oriented to person, place, and time.          Assessment & Plan:  #1 urine urgency. Not improved with medication. Discussed possible addition of other anticholinergics such as low-dose Elavil but at this point he wishes to wait. He'll  reschedule with urologist #2 myalgias. Not improved after discontinuation of statin.  Start back pravastatin #3 bilateral shoulder pain. Suspect rotator cuff tendinitis, mild. Reassurance.

## 2010-11-09 ENCOUNTER — Other Ambulatory Visit: Payer: Self-pay | Admitting: Family Medicine

## 2010-12-21 ENCOUNTER — Other Ambulatory Visit: Payer: Self-pay | Admitting: Family Medicine

## 2011-01-11 ENCOUNTER — Telehealth: Payer: Self-pay | Admitting: Family Medicine

## 2011-01-11 MED ORDER — AZITHROMYCIN 250 MG PO TABS: ORAL_TABLET | ORAL | Status: DC

## 2011-01-11 NOTE — Telephone Encounter (Signed)
Okay to call in Z-Pak but patient needs followup next week if not improving

## 2011-01-11 NOTE — Telephone Encounter (Signed)
Earlier phone note with request was OKed by Dr Caryl Never, however he signed off on note.  Rx filled from this note

## 2011-01-11 NOTE — Telephone Encounter (Signed)
Todd [name] please call zpack into [facility] [phone]

## 2011-01-11 NOTE — Telephone Encounter (Signed)
Pt requesting a Zpack be called in. States that he has no fever, just a sore throat and cough. States that the same thing happened over the summer and that this was the course of treatment. Please call to [facility] on [address] or call pt to advise. Thanks.

## 2011-01-11 NOTE — Telephone Encounter (Signed)
Please advise 

## 2011-01-18 ENCOUNTER — Encounter: Payer: Self-pay | Admitting: Family Medicine

## 2011-01-18 ENCOUNTER — Ambulatory Visit (INDEPENDENT_AMBULATORY_CARE_PROVIDER_SITE_OTHER): Payer: PRIVATE HEALTH INSURANCE | Admitting: Family Medicine

## 2011-01-18 VITALS — BP 120/70 | Temp 98.4°F | Wt 225.0 lb

## 2011-01-18 DIAGNOSIS — R059 Cough, unspecified: Secondary | ICD-10-CM

## 2011-01-18 DIAGNOSIS — R05 Cough: Secondary | ICD-10-CM

## 2011-01-18 MED ORDER — HYDROCODONE-HOMATROPINE 5-1.5 MG/5ML PO SYRP: 5.0000 mL | ORAL_SOLUTION | Freq: Four times a day (QID) | ORAL | Status: AC | PRN

## 2011-01-18 NOTE — Patient Instructions (Signed)
Please call if any fever or worsening cough

## 2011-01-18 NOTE — Progress Notes (Signed)
  Subjective:    Patient ID: Notnamed Scholz, male    DOB: Feb 05, 1945, 66 y.o.   MRN: [id number]  HPI  Patient seen with a cough for the past couple weeks. Zithromax called but has not seen improvement. Has associated fatigue. No fever or chills. No dyspnea. No hemoptysis. Minimal nasal congestion. Has not taken any over-the-counter medications. Nonsmoker. Quit 1979. No history of asthma. No wheezing. No GERD symptoms. Cough symptoms are moderate   Review of Systems As per history of present illness    Objective:   Physical Exam  Constitutional: He appears well-developed and well-nourished.  HENT:  Right Ear: External ear normal.  Left Ear: External ear normal.  Mouth/Throat: Oropharynx is clear and moist.  Neck: Neck supple.  Cardiovascular: Normal rate and regular rhythm.   Pulmonary/Chest: Effort normal and breath sounds normal. No respiratory distress. He has no wheezes. He has no rales.  Lymphadenopathy:    He has no cervical adenopathy.          Assessment & Plan:  Acute bronchitis. Suspect viral. Hycodan as needed for cough. Follow up promptly for fever or worsening symptoms.

## 2011-01-21 ENCOUNTER — Other Ambulatory Visit: Payer: Self-pay | Admitting: Family Medicine

## 2011-01-30 ENCOUNTER — Other Ambulatory Visit: Payer: Self-pay | Admitting: Family Medicine

## 2011-01-31 ENCOUNTER — Telehealth: Payer: Self-pay | Admitting: Family Medicine

## 2011-01-31 NOTE — Telephone Encounter (Signed)
Please advise 

## 2011-01-31 NOTE — Telephone Encounter (Signed)
Pt called and said that he had been discussing a new med called Oxybutynin SR with pcp and pt is req a script to be called in to [facility] on [address] at [facility].

## 2011-02-01 MED ORDER — OXYBUTYNIN CHLORIDE ER 10 MG PO TB24: 10.0000 mg | ORAL_TABLET | Freq: Every day | ORAL | Status: DC

## 2011-02-01 NOTE — Telephone Encounter (Signed)
Pt informed Rx sent. 

## 2011-02-01 NOTE — Telephone Encounter (Signed)
Start Oxybutynin SR 10 mg po once daily, disp #30 with 6 refills and may call in to pharmacy requested.

## 2011-02-05 LAB — HM DIABETES EYE EXAM

## 2011-02-07 ENCOUNTER — Other Ambulatory Visit: Payer: Self-pay | Admitting: Family Medicine

## 2011-02-07 NOTE — Telephone Encounter (Signed)
May refill once and needs to be seen if no better after that.

## 2011-02-07 NOTE — Telephone Encounter (Signed)
Last given on 01-11-11 per request on phone note, OV 1-11, ongoing cough Rx Hycodan Please advise

## 2011-02-09 NOTE — Telephone Encounter (Signed)
Please advise 

## 2011-02-10 NOTE — Telephone Encounter (Signed)
Already answered on 02-07-11   Not sure why this came back to me.

## 2011-02-14 ENCOUNTER — Encounter: Payer: Self-pay | Admitting: Family Medicine

## 2011-02-28 ENCOUNTER — Ambulatory Visit (INDEPENDENT_AMBULATORY_CARE_PROVIDER_SITE_OTHER): Payer: PRIVATE HEALTH INSURANCE | Admitting: Cardiovascular Disease

## 2011-02-28 ENCOUNTER — Encounter: Payer: Self-pay | Admitting: Cardiovascular Disease

## 2011-02-28 VITALS — BP 141/81 | HR 68 | Ht 74.0 in | Wt 220.0 lb

## 2011-02-28 DIAGNOSIS — E785 Hyperlipidemia, unspecified: Secondary | ICD-10-CM

## 2011-02-28 DIAGNOSIS — E119 Type 2 diabetes mellitus without complications: Secondary | ICD-10-CM

## 2011-02-28 NOTE — Assessment & Plan Note (Signed)
Todd [name] is  doing very well. He's not had any episodes of chest pain or shortness breath. He's been tolerating his costal medicines quite well. His last lipids are acceptable.  I'll see him again in one year. We'll plan on getting a fasting lipid profile at that time.

## 2011-02-28 NOTE — Progress Notes (Signed)
Todd [name] Date of Birth  11/15/1945 [facility]     [place] [facility]  [address]    [address]   [address] [place], [place]  [address]    [place], [place]  [address] [phone]  Fax  [phone]  [phone]  Fax [phone]  Problem list: 1. Hypercholesterolemia 2. Palpitations 3. History of chest pain- normal stress Myoview study in October, 2005 4. Diabetes mellitus   History of Present Illness:  Todd [name] is a 66 year old gentleman with a history as noted above. I've seen him once here for the past several years. He is remaining quite active. He is fairly healthy. He's been exercising on a fairly regular basis.  He denies episodes of chest pain or shortness of breath.  Current Outpatient Prescriptions on File Prior to Visit  Medication Sig Dispense Refill  . aspirin 81 MG tablet Take 81 mg by mouth daily.        Marland Kitchen co-enzyme Q-10 30 MG capsule Take 100 mg by mouth daily.       . fenofibrate 160 MG tablet Take 1 tablet (160 mg total) by mouth daily.  30 tablet  11  . lisinopril (PRINIVIL,ZESTRIL) 10 MG tablet TAKE ONE TABLET BY MOUTH DAILY FOR BLOOD PRESSURE  90 tablet  0  . metFORMIN (GLUCOPHAGE-XR) 500 MG 24 hr tablet TAKE 1 TABLET BY MOUTH DAILY WITH BREAKFAST  90 tablet  1  . oxybutynin (DITROPAN-XL) 10 MG 24 hr tablet Take 1 tablet (10 mg total) by mouth daily.  30 tablet  6  . pravastatin (PRAVACHOL) 20 MG tablet Take 1 tablet (20 mg total) by mouth daily.  30 tablet  11    Allergies  Allergen Reactions  . Actifed Cold-Sinus     rash  . Pseudoephedrine     REACTION: hives    Past Medical History  Diagnosis Date  . DIABETES MELLITUS, TYPE II 08/08/2009  . HYPERLIPIDEMIA 08/08/2009  . URINARY INCONTINENCE 08/08/2009    Past Surgical History  Procedure Date  . Appendectomy 1976  . Tonsillectomy 1961  . Achilles tendon repair 2002    rupture    History  Smoking status  . Former Smoker -- 2.0 packs/day for 10 years  . Types: Cigarettes    . Quit date: 04/19/1977  Smokeless tobacco  . Not on file    History  Alcohol Use: Not on file    Family History  Problem Relation Age of Onset  . Mental illness Mother   . Diabetes Sister     type ll    Reviw of Systems:  Reviewed in the HPI.  All other systems are negative.  Physical Exam: Blood pressure 141/81, pulse 68, height 6\' 2"  (1.88 m), weight 220 lb (99.791 kg). General: Well developed, well nourished, in no acute distress.  Head: Normocephalic, atraumatic, sclera non-icteric, mucus membranes are moist,   Neck: Supple. Negative for carotid bruits. JVD not elevated.  Lungs: Clear bilaterally to auscultation without wheezes, rales, or rhonchi. Breathing is normal.  Heart: RRR with S1 S2. No murmurs, rubs, or gallops  Abdomen: Soft, non-tender, non-distended with normal bowel sounds. No hepatomegaly. No rebound/guarding. No masses.  Msk:  Strength and tone appear normal for age.  Extremities: No clubbing or cyanosis. No edema.  Distal pedal pulses are 2+ and equal bilaterally.  Neuro: Alert and oriented X 3. Moves all extremities spontaneously.  Psych:  Responds to questions appropriately with a normal affect.  ECG: Normal sinus rhythm. Normal  EKG.  Assessment / Plan:

## 2011-02-28 NOTE — Patient Instructions (Addendum)
Your physician wants you to follow-up in: 1 year with Dr. Elease Hashimoto.  You will receive a reminder letter in the mail two months in advance. If you don't receive a letter, please call our office to schedule the follow-up appointment.  Your physician recommends that you return for lab work in: 1 year.  BMET, HFP, LIPID Panel.

## 2011-03-13 ENCOUNTER — Other Ambulatory Visit: Payer: Self-pay | Admitting: Internal Medicine

## 2011-03-14 NOTE — Telephone Encounter (Signed)
Dr burchette pt 

## 2011-03-20 ENCOUNTER — Encounter: Payer: Self-pay | Admitting: *Deleted

## 2011-04-08 ENCOUNTER — Other Ambulatory Visit (INDEPENDENT_AMBULATORY_CARE_PROVIDER_SITE_OTHER): Payer: PRIVATE HEALTH INSURANCE

## 2011-04-08 DIAGNOSIS — E785 Hyperlipidemia, unspecified: Secondary | ICD-10-CM

## 2011-04-08 LAB — BASIC METABOLIC PANEL
BUN: 17 mg/dL (ref 6–23)
CO2: 29 mEq/L (ref 19–32)
Calcium: 9.5 mg/dL (ref 8.4–10.5)
Chloride: 104 mEq/L (ref 96–112)
Creatinine, Ser: 1.1 mg/dL (ref 0.4–1.5)
GFR: 90.3 mL/min (ref >60.00)
Glucose, Bld: 120 mg/dL — ABNORMAL HIGH (ref 70–99)
Potassium: 4.1 mEq/L (ref 3.5–5.1)
Sodium: 141 mEq/L (ref 135–145)

## 2011-04-08 LAB — HEPATIC FUNCTION PANEL
ALT: 32 U/L (ref 0–53)
AST: 21 U/L (ref 0–37)
Albumin: 4.5 g/dL (ref 3.5–5.2)
Alkaline Phosphatase: 44 U/L (ref 39–117)
Bilirubin, Direct: 0.1 mg/dL (ref 0.0–0.3)
Total Bilirubin: 0.3 mg/dL (ref 0.3–1.2)
Total Protein: 7.4 g/dL (ref 6.0–8.3)

## 2011-04-08 LAB — LIPID PANEL
Cholesterol: 158 mg/dL (ref 0–200)
HDL: 41 mg/dL (ref >39.00)
LDL Cholesterol: 91 mg/dL (ref 0–99)
Total CHOL/HDL Ratio: 4
Triglycerides: 129 mg/dL (ref 0.0–149.0)
VLDL: 25.8 mg/dL (ref 0.0–40.0)

## 2011-04-09 ENCOUNTER — Telehealth: Payer: Self-pay | Admitting: Cardiovascular Disease

## 2011-04-09 NOTE — Telephone Encounter (Signed)
Close  

## 2011-04-15 ENCOUNTER — Encounter: Payer: Self-pay | Admitting: Family Medicine

## 2011-04-15 ENCOUNTER — Telehealth: Payer: Self-pay | Admitting: Family Medicine

## 2011-04-15 ENCOUNTER — Ambulatory Visit (INDEPENDENT_AMBULATORY_CARE_PROVIDER_SITE_OTHER): Payer: PRIVATE HEALTH INSURANCE | Admitting: Family Medicine

## 2011-04-15 VITALS — BP 120/70 | Temp 97.7°F | Wt 216.0 lb

## 2011-04-15 DIAGNOSIS — E785 Hyperlipidemia, unspecified: Secondary | ICD-10-CM

## 2011-04-15 DIAGNOSIS — E119 Type 2 diabetes mellitus without complications: Secondary | ICD-10-CM

## 2011-04-15 NOTE — Telephone Encounter (Signed)
Pt was told to come back for 6 month fup and pt said that he needs to get labs done prior to ov, but there was no future labs ordered. Pls order any labs needed for ov and advise.

## 2011-04-15 NOTE — Telephone Encounter (Signed)
Pt here for OV today.  

## 2011-04-15 NOTE — Progress Notes (Signed)
  Subjective:    Patient ID: Todd [name], male    DOB: [birth year], 66 y.o.   MRN: [id number]  HPI  Medical followup. Patient has history of type 2 diabetes, hyperlipidemia, and hypertension. Blood pressure been stable. No orthostasis. Recent lipids are viewed and improved. LDL at goal. Triglycerides normal. Remains on pravastatin. No myalgias.  Type 2 diabetes. A1c was not drawn with recent labs. Not monitoring blood sugars. Diabetes been well controlled for several years. No symptoms of hyperglycemia. He does have some urinary urgency and new medication per her urologist recently and tolerating well.  Past Medical History  Diagnosis Date  . DIABETES MELLITUS, TYPE II 08/08/2009  . HYPERLIPIDEMIA 08/08/2009  . URINARY INCONTINENCE 08/08/2009  . Palpitations   . Hiatal hernia    Past Surgical History  Procedure Date  . Appendectomy 1976  . Tonsillectomy 1961  . Achilles tendon repair 2002    rupture  . US echocardiography 02-10-2001    EF 60-65%  . Cardiovascular stress test 11-02-2003    EF 57%    reports that he quit smoking about 34 years ago. His smoking use included Cigarettes. He has a 20 pack-year smoking history. He does not have any smokeless tobacco history on file. His alcohol and drug histories not on file. family history includes Diabetes in his sister and Mental illness in his mother. Allergies  Allergen Reactions  . Actifed Cold-Sinus     rash  . Pseudoephedrine     REACTION: hives     Review of Systems  Constitutional: Negative for fatigue.  Eyes: Negative for visual disturbance.  Respiratory: Negative for cough, chest tightness and shortness of breath.   Cardiovascular: Negative for chest pain, palpitations and leg swelling.  Neurological: Negative for dizziness, syncope, weakness, light-headedness and headaches.       Objective:   Physical Exam  Constitutional: He appears well-developed and well-nourished.  HENT:  Right Ear: External ear normal.  Left  Ear: External ear normal.  Mouth/Throat: Oropharynx is clear and moist.  Neck: Neck supple. No thyromegaly present.  Cardiovascular: Normal rate and regular rhythm.   Pulmonary/Chest: Effort normal and breath sounds normal. No respiratory distress. He has no wheezes. He has no rales.  Musculoskeletal: He exhibits no edema.  Lymphadenopathy:    He has no cervical adenopathy.          Assessment & Plan:  #1 hypertension stable continue current medications #2 history of dyslipidemia. Lipids improved. Continue current medications  #3 type 2 diabetes. Patient needs A1c at followup. We have suggested monitoring fasting sugars at least once or twice weekly. Exercise discussed.

## 2011-05-21 ENCOUNTER — Telehealth: Payer: Self-pay | Admitting: Family Medicine

## 2011-05-21 NOTE — Telephone Encounter (Signed)
Observe off any blood pressure medications for now as recent blood pressure was well-controlled. He would not be a candidate for angiotensin receptor blocker with possible angioedema with ACE inhibitor. We need to make sure he is office visit within 2-3 months to reassess

## 2011-05-21 NOTE — Telephone Encounter (Signed)
Pt stated he had reaction to lisinopril lips swelling. Pt decline ov. Pt would like a different medication. [facility] [phone]

## 2011-05-21 NOTE — Telephone Encounter (Signed)
Pt informed, OV scheduled.

## 2011-05-21 NOTE — Telephone Encounter (Signed)
Pt is sch for 7-19 8.30am

## 2011-06-27 ENCOUNTER — Encounter: Payer: Self-pay | Admitting: Family Medicine

## 2011-06-27 ENCOUNTER — Ambulatory Visit (INDEPENDENT_AMBULATORY_CARE_PROVIDER_SITE_OTHER): Payer: PRIVATE HEALTH INSURANCE | Admitting: Family Medicine

## 2011-06-27 VITALS — BP 128/70 | Temp 98.5°F | Wt 218.0 lb

## 2011-06-27 DIAGNOSIS — T783XXA Angioneurotic edema, initial encounter: Secondary | ICD-10-CM

## 2011-06-27 DIAGNOSIS — R079 Chest pain, unspecified: Secondary | ICD-10-CM

## 2011-06-27 DIAGNOSIS — E119 Type 2 diabetes mellitus without complications: Secondary | ICD-10-CM

## 2011-06-27 LAB — HEMOGLOBIN A1C: Hgb A1c MFr Bld: 6.7 % — ABNORMAL HIGH (ref 4.6–6.5)

## 2011-06-27 NOTE — Progress Notes (Signed)
  Subjective:    Patient ID: Todd [name], male    DOB: 05/11/1945, 66 y.o.   MRN: [id number]  HPI  Patient seen for medical followup. History of type 2 diabetes, hyperlipidemia, and urinary incontinence. He was on low-dose lisinopril 10 mg daily until one month ago-presumably for renal protection for his Type 2 DM. He developed some angioedema mostly involving the lips. He was aware of potential association with ACE inhibitors and stopped medication promptly. Symptoms resolved and he has not had any recurrence since then. No history of food allergies. His blood pressures been fairly stable mostly around 130 systolic over 80 diastolic. No headaches or dizziness.  4-5 days ago noticed some vague chest symptoms. Dull chest pain which is not related to exertion. He denies any dyspnea. No radiation of symptoms to the arm or neck. His symptoms of pain are mild in severity and no clear triggering factors. No alleviating factors.  Past Medical History  Diagnosis Date  . DIABETES MELLITUS, TYPE II 08/08/2009  . HYPERLIPIDEMIA 08/08/2009  . URINARY INCONTINENCE 08/08/2009  . Palpitations   . Hiatal hernia    Past Surgical History  Procedure Date  . Appendectomy 1976  . Tonsillectomy 1961  . Achilles tendon repair 2002    rupture  . US echocardiography 02-10-2001    EF 60-65%  . Cardiovascular stress test 11-02-2003    EF 57%    reports that he quit smoking about 34 years ago. His smoking use included Cigarettes. He has a 20 pack-year smoking history. He does not have any smokeless tobacco history on file. His alcohol and drug histories not on file. family history includes Diabetes in his sister and Mental illness in his mother. Allergies  Allergen Reactions  . Actifed Cold-Sinus     rash  . Lisinopril Swelling  . Pseudoephedrine     REACTION: hives      Review of Systems  Constitutional: Negative for fever, chills, appetite change, fatigue and unexpected weight change.  HENT: Negative for  trouble swallowing.   Respiratory: Negative for cough and shortness of breath.   Cardiovascular: Positive for chest pain. Negative for palpitations and leg swelling.  Gastrointestinal: Negative for abdominal pain.  Neurological: Negative for dizziness and headaches.       Objective:   Physical Exam  Constitutional: He appears well-developed and well-nourished.  HENT:  Mouth/Throat: Oropharynx is clear and moist.  Neck: Neck supple. No thyromegaly present.  Cardiovascular: Normal rate and regular rhythm.   Pulmonary/Chest: Effort normal and breath sounds normal. No respiratory distress. He has no wheezes. He has no rales.  Musculoskeletal: He exhibits no edema.  Lymphadenopathy:    He has no cervical adenopathy.          Assessment & Plan:  #1 recent angioedema type reaction by description. Possibly related to ACE inhibitor. Avoid ACE inhibitor and probably ARBs as well. Followup promptly for any recurrence.  No compelling need for further BP meds at this time-based on BP today. #2 atypical chest symptoms. Doubt cardiac related but patient has risk factors with diabetes. Check EKG. He has never had exertional symptoms with exercise which is reassuring.  EKG NSR with no acute changes.

## 2011-06-28 NOTE — Progress Notes (Signed)
Quick Note:  Pt informed ______ 

## 2011-07-20 ENCOUNTER — Other Ambulatory Visit: Payer: Self-pay | Admitting: Family Medicine

## 2011-07-26 ENCOUNTER — Ambulatory Visit: Payer: PRIVATE HEALTH INSURANCE | Admitting: Family Medicine

## 2011-10-02 DIAGNOSIS — R350 Frequency of micturition: Secondary | ICD-10-CM | POA: Diagnosis not present

## 2011-10-02 DIAGNOSIS — R351 Nocturia: Secondary | ICD-10-CM | POA: Diagnosis not present

## 2011-10-02 DIAGNOSIS — R3915 Urgency of urination: Secondary | ICD-10-CM | POA: Diagnosis not present

## 2011-10-10 ENCOUNTER — Other Ambulatory Visit: Payer: PRIVATE HEALTH INSURANCE

## 2011-10-11 DIAGNOSIS — R351 Nocturia: Secondary | ICD-10-CM | POA: Diagnosis not present

## 2011-10-11 DIAGNOSIS — R350 Frequency of micturition: Secondary | ICD-10-CM | POA: Diagnosis not present

## 2011-10-11 DIAGNOSIS — R3915 Urgency of urination: Secondary | ICD-10-CM | POA: Diagnosis not present

## 2011-10-14 ENCOUNTER — Ambulatory Visit (INDEPENDENT_AMBULATORY_CARE_PROVIDER_SITE_OTHER): Payer: Medicare Other | Admitting: Family Medicine

## 2011-10-14 ENCOUNTER — Encounter: Payer: Self-pay | Admitting: Family Medicine

## 2011-10-14 VITALS — BP 130/78 | HR 72 | Temp 98.1°F | Resp 12 | Ht 74.0 in | Wt 226.0 lb

## 2011-10-14 DIAGNOSIS — E785 Hyperlipidemia, unspecified: Secondary | ICD-10-CM | POA: Diagnosis not present

## 2011-10-14 DIAGNOSIS — Z0000 Encounter for general adult medical examination without abnormal findings: Secondary | ICD-10-CM

## 2011-10-14 DIAGNOSIS — Z23 Encounter for immunization: Secondary | ICD-10-CM | POA: Diagnosis not present

## 2011-10-14 DIAGNOSIS — Z125 Encounter for screening for malignant neoplasm of prostate: Secondary | ICD-10-CM | POA: Diagnosis not present

## 2011-10-14 DIAGNOSIS — N320 Bladder-neck obstruction: Secondary | ICD-10-CM

## 2011-10-14 DIAGNOSIS — E119 Type 2 diabetes mellitus without complications: Secondary | ICD-10-CM | POA: Diagnosis not present

## 2011-10-14 LAB — BASIC METABOLIC PANEL
BUN: 19 mg/dL (ref 6–23)
CO2: 27 mEq/L (ref 19–32)
Calcium: 9.3 mg/dL (ref 8.4–10.5)
Chloride: 102 mEq/L (ref 96–112)
Creatinine, Ser: 1 mg/dL (ref 0.4–1.5)
GFR: 97.55 mL/min (ref >60.00)
Glucose, Bld: 120 mg/dL — ABNORMAL HIGH (ref 70–99)
Potassium: 4.3 mEq/L (ref 3.5–5.1)
Sodium: 136 mEq/L (ref 135–145)

## 2011-10-14 LAB — HEPATIC FUNCTION PANEL
ALT: 31 U/L (ref 0–53)
AST: 18 U/L (ref 0–37)
Albumin: 4.1 g/dL (ref 3.5–5.2)
Alkaline Phosphatase: 45 U/L (ref 39–117)
Bilirubin, Direct: 0.1 mg/dL (ref 0.0–0.3)
Total Bilirubin: 0.5 mg/dL (ref 0.3–1.2)
Total Protein: 7.3 g/dL (ref 6.0–8.3)

## 2011-10-14 LAB — PSA: PSA: 1.08 ng/mL (ref 0.10–4.00)

## 2011-10-14 LAB — HEMOGLOBIN A1C: Hgb A1c MFr Bld: 7 % — ABNORMAL HIGH (ref 4.6–6.5)

## 2011-10-14 LAB — LIPID PANEL
Cholesterol: 180 mg/dL (ref 0–200)
HDL: 38.2 mg/dL — ABNORMAL LOW (ref >39.00)
LDL Cholesterol: 109 mg/dL — ABNORMAL HIGH (ref 0–99)
Total CHOL/HDL Ratio: 5
Triglycerides: 162 mg/dL — ABNORMAL HIGH (ref 0.0–149.0)
VLDL: 32.4 mg/dL (ref 0.0–40.0)

## 2011-10-14 NOTE — Patient Instructions (Signed)
We will call you regarding abdominal aortic aneurysm screening Establish more consistent aerobic exercise

## 2011-10-14 NOTE — Progress Notes (Signed)
Subjective:    Patient ID: Todd [name], male    DOB: 1945-04-18, 66 y.o.   MRN: [id number]  HPI  Patient is here for medical followup and Medicare wellness exam. Type 2 diabetes, dyslipidemia, and urinary urgency. Followed by urology. Colonoscopy up to date. History of shingles vaccine. Tetanus up-to-date. Needs flu vaccine. Needs Pneumovax booster (last estimated per pt > 5 years ago). Exercises somewhat inconsistently.  Ex-smoker. He smoked for about 30 years. No family history of vascular disease. Medications reviewed. Compliant with all. No recent chest pains. No dyspnea. No dizziness.  Diabetes has been stable.  No symptoms of hyperglycemia. No myalgias on Pravastatin.  Past Medical History  Diagnosis Date  . DIABETES MELLITUS, TYPE II 08/08/2009  . HYPERLIPIDEMIA 08/08/2009  . URINARY INCONTINENCE 08/08/2009  . Palpitations   . Hiatal hernia    Past Surgical History  Procedure Date  . Appendectomy 1976  . Tonsillectomy 1961  . Achilles tendon repair 2002    rupture  . US echocardiography 02-10-2001    EF 60-65%  . Cardiovascular stress test 11-02-2003    EF 57%    reports that he quit smoking about 34 years ago. His smoking use included Cigarettes. He has a 20 pack-year smoking history. He does not have any smokeless tobacco history on file. His alcohol and drug histories not on file. family history includes Diabetes in his sister and Mental illness in his mother. Allergies  Allergen Reactions  . Actifed Cold-Sinus     rash  . Lisinopril Swelling  . Pseudoephedrine     REACTION: hives   1.  Risk factors based on Past Medical , Social, and Family history reviewed as above 2.  Limitations in physical activities low risk for fall. 3.  Depression/mood no depression or anxiety issues 4.  Hearing no deficits 5.  ADLs fully independent in all. Works full-time 6.  Cognitive function (orientation to time and place, language, writing, speech,memory) no memory deficits. No language  or judgment impairment 7.  Home Safety no issues 8.  Height, weight, and visual acuity. All stable. Received yearly eye exams 9.  Counseling discussed regular exercise 10. Recommendation of preventive services. Influenza vaccine and Pneumovax. Abdominal screening for aortic aneurysm 11. Labs based on risk factors lipid and hepatic panel. A1c. PSA. 12. Care Plan as above    Review of Systems  Constitutional: Negative for fever, activity change, appetite change and fatigue.  HENT: Negative for ear pain, congestion and trouble swallowing.   Eyes: Negative for pain and visual disturbance.  Respiratory: Negative for cough, shortness of breath and wheezing.   Cardiovascular: Negative for chest pain and palpitations.  Gastrointestinal: Negative for nausea, vomiting, abdominal pain, diarrhea, constipation, blood in stool, abdominal distention and rectal pain.  Genitourinary: Negative for dysuria, hematuria and testicular pain.  Musculoskeletal: Negative for joint swelling and arthralgias.  Skin: Negative for rash.  Neurological: Negative for dizziness, syncope and headaches.  Hematological: Negative for adenopathy.  Psychiatric/Behavioral: Negative for confusion and dysphoric mood.       Objective:   Physical Exam  Constitutional: He is oriented to person, place, and time. He appears well-developed and well-nourished. No distress.  HENT:  Head: Normocephalic and atraumatic.  Right Ear: External ear normal.  Left Ear: External ear normal.  Mouth/Throat: Oropharynx is clear and moist.  Eyes: Conjunctivae normal and EOM are normal. Pupils are equal, round, and reactive to light.  Neck: Normal range of motion. Neck supple. No thyromegaly present.  Cardiovascular: Normal rate,  regular rhythm and normal heart sounds.   No murmur heard. Pulmonary/Chest: No respiratory distress. He has no wheezes. He has no rales.  Abdominal: Soft. Bowel sounds are normal. He exhibits no distension and no  mass. There is no tenderness. There is no rebound and no guarding.  Genitourinary:       deferred as pt recently saw urologist  Musculoskeletal: He exhibits no edema.  Lymphadenopathy:    He has no cervical adenopathy.  Neurological: He is alert and oriented to person, place, and time. He displays normal reflexes. No cranial nerve deficit.  Skin: No rash noted.  Psychiatric: He has a normal mood and affect.          Assessment & Plan:  #1 health maintenance. Flu vaccine given. Pneumovax given. Set up abdominal aortic aneurysm screening given his age, prior history smoking, and diabetes status.  #2 type 2 diabetes. Recheck hemoglobin A1c. History of good control   #3 history of dyslipidemia. Check lipid and hepatic panel

## 2011-10-15 ENCOUNTER — Encounter: Payer: Self-pay | Admitting: Family Medicine

## 2011-10-15 ENCOUNTER — Ambulatory Visit: Payer: PRIVATE HEALTH INSURANCE | Admitting: Family Medicine

## 2011-10-15 ENCOUNTER — Encounter: Payer: PRIVATE HEALTH INSURANCE | Admitting: Family Medicine

## 2011-10-15 NOTE — Progress Notes (Signed)
Quick Note:  Pt informed on personally identified VM ______ 

## 2011-10-23 ENCOUNTER — Ambulatory Visit (INDEPENDENT_AMBULATORY_CARE_PROVIDER_SITE_OTHER)
Admission: RE | Admit: 2011-10-23 | Discharge: 2011-10-23 | Disposition: A | Discharge status: Home / Self Care | Payer: PRIVATE HEALTH INSURANCE | Source: Ambulatory Visit | Attending: Internal Medicine | Admitting: Internal Medicine

## 2011-10-23 ENCOUNTER — Ambulatory Visit (INDEPENDENT_AMBULATORY_CARE_PROVIDER_SITE_OTHER): Payer: Medicare Other | Admitting: Internal Medicine

## 2011-10-23 VITALS — BP 140/80 | HR 72 | Temp 98.6°F | Resp 16 | Ht 74.0 in | Wt 218.0 lb

## 2011-10-23 DIAGNOSIS — M25571 Pain in right ankle and joints of right foot: Secondary | ICD-10-CM

## 2011-10-23 DIAGNOSIS — M25579 Pain in unspecified ankle and joints of unspecified foot: Secondary | ICD-10-CM

## 2011-10-23 DIAGNOSIS — M7989 Other specified soft tissue disorders: Secondary | ICD-10-CM | POA: Diagnosis not present

## 2011-10-23 NOTE — Assessment & Plan Note (Signed)
66 year old African American male bumped his right ankle against the metal bed frame approximately 6 days ago. He has persistent tenderness and ankle swelling. Obtain x-ray to rule out bony abnormality/fracture. Continue rest, ice, compression, and elevation. Follow up with primary care doctor  within 2 weeks.

## 2011-10-23 NOTE — Progress Notes (Signed)
Subjective:    Patient ID: Todd [name], male    DOB: 1945/11/14, 66 y.o.   MRN: [id number]  HPI  66 year old African American male with history of type 2 diabetes and hyperlipidemia complains of right ankle pain. Patient reports that he hit the lateral aspect of right ankle against a metal bed frame approximately 6 days ago while he was staying in a hotel. He did not experience any significant pain when injury occurred.  Patient reports he later developed right ankle swelling and point tenderness above the lateral malleolus. There is no hematoma or sign of bruise. He denies pain but has mild discomfort with weightbearing.  Review of Systems  No fever or redness  Past Medical History  Diagnosis Date  . DIABETES MELLITUS, TYPE II 08/08/2009  . HYPERLIPIDEMIA 08/08/2009  . URINARY INCONTINENCE 08/08/2009  . Palpitations   . Hiatal hernia     History   Social History  . Marital Status: Married    Spouse Name: N/A    Number of Children: N/A  . Years of Education: N/A   Occupational History  . Not on file.   Social History Main Topics  . Smoking status: Former Smoker -- 2.0 packs/day for 10 years    Types: Cigarettes    Quit date: 04/19/1977  . Smokeless tobacco: Not on file  . Alcohol Use: Not on file  . Drug Use: Not on file  . Sexually Active: Not on file   Other Topics Concern  . Not on file   Social History Narrative  . No narrative on file    Past Surgical History  Procedure Date  . Appendectomy 1976  . Tonsillectomy 1961  . Achilles tendon repair 2002    rupture  . US echocardiography 02-10-2001    EF 60-65%  . Cardiovascular stress test 11-02-2003    EF 57%    Family History  Problem Relation Age of Onset  . Mental illness Mother   . Diabetes Sister     type ll    Allergies  Allergen Reactions  . Actifed Cold-Sinus     rash  . Lisinopril Swelling  . Pseudoephedrine     REACTION: hives    Current Outpatient Prescriptions on File Prior to Visit    Medication Sig Dispense Refill  . aspirin 81 MG tablet Take 81 mg by mouth daily.        Marland Kitchen co-enzyme Q-10 30 MG capsule Take 100 mg by mouth daily.       . fenofibrate 160 MG tablet TAKE 1 TABLET BY MOUTH EVERY DAY  90 tablet  3  . metFORMIN (GLUCOPHAGE-XR) 500 MG 24 hr tablet TAKE 1 TABLET BY MOUTH DAILY WITH BREAKFAST  90 tablet  3  . mirabegron ER (MYRBETRIQ) 50 MG TB24 Take 50 mg by mouth daily.      . NON FORMULARY Vemma(Diet Suppliment) QD      . polycarbophil (FIBERCON) 625 MG tablet Take 625 mg by mouth daily. Taking 2 Tablet      . pravastatin (PRAVACHOL) 20 MG tablet TAKE 1 TABLET BY MOUTH EVERY DAY  90 tablet  3    BP 140/80  Pulse 72  Temp 98.6 F (37 C)  Resp 16  Ht 6\' 2"  (1.88 m)  Wt 218 lb (98.884 kg)  BMI 27.99 kg/m2       Objective:   Physical Exam  Constitutional: He appears well-developed and well-nourished.  Cardiovascular: Normal rate, regular rhythm and normal heart sounds.  Pulmonary/Chest: Effort normal and breath sounds normal. He has no wheezes.  Musculoskeletal:       Right ankle swelling.  Ankle mortise is stable. No pain with inversion or eversion of right ankle. Point tenderness half inch above the lateral malleolus.          Assessment & Plan:

## 2011-10-23 NOTE — Patient Instructions (Signed)
Use ice, compression and elevation as directed. Follow up with Dr. Caryl Never within 2 weeks.

## 2011-10-31 ENCOUNTER — Encounter: Payer: Self-pay | Admitting: Internal Medicine

## 2011-11-07 ENCOUNTER — Encounter (INDEPENDENT_AMBULATORY_CARE_PROVIDER_SITE_OTHER): Payer: Medicare Other

## 2011-11-07 DIAGNOSIS — Z0000 Encounter for general adult medical examination without abnormal findings: Secondary | ICD-10-CM | POA: Diagnosis not present

## 2011-11-07 DIAGNOSIS — E119 Type 2 diabetes mellitus without complications: Secondary | ICD-10-CM

## 2011-11-11 ENCOUNTER — Encounter: Payer: Self-pay | Admitting: Family Medicine

## 2011-11-14 ENCOUNTER — Encounter: Payer: Self-pay | Admitting: Family Medicine

## 2011-11-20 DIAGNOSIS — R350 Frequency of micturition: Secondary | ICD-10-CM | POA: Diagnosis not present

## 2011-11-20 DIAGNOSIS — R351 Nocturia: Secondary | ICD-10-CM | POA: Diagnosis not present

## 2011-11-20 DIAGNOSIS — R3915 Urgency of urination: Secondary | ICD-10-CM | POA: Diagnosis not present

## 2012-01-22 LAB — HM DIABETES EYE EXAM

## 2012-01-28 LAB — HM DIABETES EYE EXAM

## 2012-02-05 ENCOUNTER — Encounter: Payer: Self-pay | Admitting: Family Medicine

## 2012-02-05 ENCOUNTER — Ambulatory Visit (INDEPENDENT_AMBULATORY_CARE_PROVIDER_SITE_OTHER): Payer: Medicare Other | Admitting: Family Medicine

## 2012-02-05 VITALS — BP 130/88 | Temp 97.7°F | Wt 226.0 lb

## 2012-02-05 DIAGNOSIS — E119 Type 2 diabetes mellitus without complications: Secondary | ICD-10-CM | POA: Diagnosis not present

## 2012-02-05 DIAGNOSIS — E785 Hyperlipidemia, unspecified: Secondary | ICD-10-CM | POA: Diagnosis not present

## 2012-02-05 LAB — HEMOGLOBIN A1C: Hgb A1c MFr Bld: 7.3 % — ABNORMAL HIGH (ref 4.6–6.5)

## 2012-02-05 LAB — HM DIABETES FOOT EXAM: HM Diabetic Foot Exam: NORMAL

## 2012-02-05 MED ORDER — PRAVASTATIN SODIUM 40 MG PO TABS: 40.0000 mg | ORAL_TABLET | Freq: Every day | ORAL | Status: DC

## 2012-02-05 NOTE — Progress Notes (Signed)
  Subjective:    Patient ID: Todd [name], male    DOB: 11-Dec-1945, 67 y.o.   MRN: [id number]  HPI  Patient here for medical followup. Type 2 diabetes and hyperlipidemia. Takes pravastatin 20 mg daily for hyperlipidemia. No side effects. Compliant with therapy. Recent LDL 109. Recent A1c 7.0%. Takes metformin extended release 500 mg once daily. Not checking blood sugars regularly. No symptoms of hyperglycemia. Eye exam 2 weeks ago reportedly normal. No retinopathy. No neuropathy symptoms.  Past Medical History  Diagnosis Date  . DIABETES MELLITUS, TYPE II 08/08/2009  . HYPERLIPIDEMIA 08/08/2009  . URINARY INCONTINENCE 08/08/2009  . Palpitations   . Hiatal hernia    Past Surgical History  Procedure Date  . Appendectomy 1976  . Tonsillectomy 1961  . Achilles tendon repair 2002    rupture  . US echocardiography 02-10-2001    EF 60-65%  . Cardiovascular stress test 11-02-2003    EF 57%    reports that he quit smoking about 34 years ago. His smoking use included Cigarettes. He has a 20 pack-year smoking history. He does not have any smokeless tobacco history on file. His alcohol and drug histories not on file. family history includes Diabetes in his sister and Mental illness in his mother. Allergies  Allergen Reactions  . Actifed Cold-Sinus     rash  . Lisinopril Swelling  . Pseudoephedrine     REACTION: hives      Review of Systems  Constitutional: Negative for fatigue and unexpected weight change.  Eyes: Negative for visual disturbance.  Respiratory: Negative for cough, chest tightness and shortness of breath.   Cardiovascular: Negative for chest pain, palpitations and leg swelling.  Neurological: Negative for dizziness, syncope, weakness, light-headedness and headaches.       Objective:   Physical Exam  Constitutional: He appears well-developed and well-nourished.  Neck: Neck supple. No thyromegaly present.  Cardiovascular: Normal rate and regular rhythm.   No murmur  heard. Pulmonary/Chest: Effort normal and breath sounds normal. No respiratory distress. He has no wheezes. He has no rales.  Musculoskeletal: He exhibits no edema.  Skin:       Feet reveal no skin lesions. Good distal foot pulses. Good capillary refill. No calluses. Normal sensation with monofilament testing           Assessment & Plan:  #1 type 2 diabetes. Recent A1c 7.0%. Reassess A1c. If elevating, consider titration of metformin. recent eye exam normal #2 hyperlipidemia. LDL not at goal. Titrate pravastatin 40 mg daily. Reassess lipids at followup

## 2012-02-13 ENCOUNTER — Encounter: Payer: Self-pay | Admitting: Cardiovascular Disease

## 2012-02-13 ENCOUNTER — Ambulatory Visit (INDEPENDENT_AMBULATORY_CARE_PROVIDER_SITE_OTHER): Payer: Medicare Other | Admitting: Cardiovascular Disease

## 2012-02-13 ENCOUNTER — Other Ambulatory Visit: Payer: Medicare Other

## 2012-02-13 VITALS — BP 154/86 | HR 89 | Ht 74.0 in | Wt 226.0 lb

## 2012-02-13 DIAGNOSIS — E785 Hyperlipidemia, unspecified: Secondary | ICD-10-CM

## 2012-02-13 DIAGNOSIS — I10 Essential (primary) hypertension: Secondary | ICD-10-CM | POA: Diagnosis not present

## 2012-02-13 NOTE — Patient Instructions (Addendum)
Your physician wants you to follow-up in: 1 year  You will receive a reminder letter in the mail two months in advance. If you don't receive a letter, please call our office to schedule the follow-up appointment.  Please have your fasting labs done with your primary care physician this year and we will get them next year.   REDUCE HIGH SODIUM FOODS LIKE CANNED SOUP, GRAVY, SAUCES, READY PREPARED FOODS LIKE FROZEN FOODS; LEAN CUISINE, LASAGNA. BACON, SAUSAGE, LUNCH MEAT, FAST FOODS.Marland Kitchen Chips and hot dogs  Your physician discussed the importance of regular exercise and recommended that you start or continue a regular exercise program for good health.  DASH Diet The DASH diet stands for "Dietary Approaches to Stop Hypertension." It is a healthy eating plan that has been shown to reduce high blood pressure (hypertension) in as little as 14 days, while also possibly providing other significant health benefits. These other health benefits include reducing the risk of breast cancer after menopause and reducing the risk of type 2 diabetes, heart disease, colon cancer, and stroke. Health benefits also include weight loss and slowing kidney failure in patients with chronic kidney disease.  DIET GUIDELINES  Limit salt (sodium). Your diet should contain less than 1500 mg of sodium daily.  Limit refined or processed carbohydrates. Your diet should include mostly whole grains. Desserts and added sugars should be used sparingly.  Include small amounts of heart-healthy fats. These types of fats include nuts, oils, and tub margarine. Limit saturated and trans fats. These fats have been shown to be harmful in the body. CHOOSING FOODS  The following food groups are based on a 2000 calorie diet. See your Registered Dietitian for individual calorie needs. Grains and Grain Products (6 to 8 servings daily)  Eat More Often: Whole-wheat bread, brown rice, whole-grain or wheat pasta, quinoa, popcorn without added fat or  salt (air popped).  Eat Less Often: White bread, white pasta, white rice, cornbread. Vegetables (4 to 5 servings daily)  Eat More Often: Fresh, frozen, and canned vegetables. Vegetables may be raw, steamed, roasted, or grilled with a minimal amount of fat.  Eat Less Often/Avoid: Creamed or fried vegetables. Vegetables in a cheese sauce. Fruit (4 to 5 servings daily)  Eat More Often: All fresh, canned (in natural juice), or frozen fruits. Dried fruits without added sugar. One hundred percent fruit juice ( cup [237 mL] daily).  Eat Less Often: Dried fruits with added sugar. Canned fruit in light or heavy syrup. [employer], Fish, and Poultry (2 servings or less daily. One serving is 3 to 4 oz [85-114 g]).  Eat More Often: Ninety percent or leaner ground beef, tenderloin, sirloin. Round cuts of beef, chicken breast, [place] breast. All fish. Grill, bake, or broil your meat. Nothing should be fried.  Eat Less Often/Avoid: Fatty cuts of meat, [place], or chicken leg, thigh, or wing. Fried cuts of meat or fish. Dairy (2 to 3 servings)  Eat More Often: Low-fat or fat-free milk, low-fat plain or light yogurt, reduced-fat or part-skim cheese.  Eat Less Often/Avoid: Milk (whole, 2%).Whole milk yogurt. Full-fat cheeses. Nuts, Seeds, and Legumes (4 to 5 servings per week)  Eat More Often: All without added salt.  Eat Less Often/Avoid: Salted nuts and seeds, canned beans with added salt. Fats and Sweets (limited)  Eat More Often: Vegetable oils, tub margarines without trans fats, sugar-free gelatin. Mayonnaise and salad dressings.  Eat Less Often/Avoid: Coconut oils, palm oils, butter, stick margarine, cream, half and half, cookies, candy,  pie. FOR MORE INFORMATION The Dash Diet Eating Plan: www.dashdiet.org Document Released: 12/13/2010 Document Revised: 03/18/2011 Document Reviewed: 12/13/2010 [facility] Patient Information 2013 [place], [place].  Basic Carbohydrate Counting Basic  carbohydrate counting is a way to plan meals. It is done by counting the amount of carbohydrate in foods. Foods that have carbohydrates are starches (grains, beans, starchy vegetables) and sweets. Eating carbohydrates increases blood glucose (sugar) levels. People with diabetes use carbohydrate counting to help keep their blood glucose at a normal level.  COUNTING CARBOHYDRATES IN FOODS The first step in counting carbohydrates is to learn how many carbohydrate servings you should have in every meal. A dietitian can plan this for you. After learning the amount of carbohydrates to include in your meal plan, you can start to choose the carbohydrate-containing foods you want to eat.  There are 2 ways to identify the amount of carbohydrates in the foods you eat.  Read the Nutrition Facts panel on food labels. You need 2 pieces of information from the Nutrition Facts panel to count carbohydrates this way:  Serving size.  Total carbohydrate (in grams). Decide how many servings you will be eating. If it is 1 serving, you will be eating the amount of carbohydrate listed on the panel. If you will be eating 2 servings, you will be eating double the amount of carbohydrate listed on the panel.   Learn serving sizes. A serving size of most carbohydrate-containing foods is about 15 grams (g). Listed below are single serving sizes of common carbohydrate-containing foods:  1 slice bread.   cup unsweetened, dry cereal.   cup hot cereal.   cup rice.   cup mashed potatoes.   cup pasta.  1 cup fresh fruit.   cup canned fruit.  1 cup milk (whole, 2%, or skim).   cup starchy vegetables (peas, corn, or potatoes). Counting carbohydrates this way is similar to looking on the Nutrition Facts panel. Decide how many servings you will eat first. Multiply the number of servings you eat by 15 g. For example, if you have 2 cups of strawberries, you had 2 servings. That means you had 30 g of carbohydrate (2  servings x 15 g = 30 g). CALCULATING CARBOHYDRATES IN A MEAL Sample dinner  3 oz chicken breast.   cup brown rice.   cup corn.  1 cup fat-free milk.  1 cup strawberries with sugar-free whipped topping. Carbohydrate calculation First, identify the foods that contain carbohydrate:  Rice.  Corn.  Milk.  Strawberries. Calculate the number of servings eaten:  2 servings rice.  1 serving corn.  1 serving milk.  1 serving strawberries. Multiply the number of servings by 15 g:  2 servings rice x 15 g = 30 g.  1 serving corn x 15 g = 15 g.  1 serving milk x 15 g = 15 g.  1 serving strawberries x 15 g = 15 g. Add the amounts to find the total carbohydrates eaten: 30 g + 15 g + 15 g + 15 g = 75 g carbohydrate eaten at dinner. Document Released: 12/24/2004 Document Revised: 03/18/2011 Document Reviewed: 11/09/2010 [facility] Patient Information 2013 [place], [place].

## 2012-02-13 NOTE — Assessment & Plan Note (Signed)
Todd [name]'s blood pressure is a little bit elevated. I suspect this is due to his recent weight gain. We'll continue the same medications. I've encouraged him to exercise regularly and work on a good diet and weight loss program. This certainly will help his hypertension.

## 2012-02-13 NOTE — Progress Notes (Signed)
Todd [name] Date of Birth  1945/07/05 [facility]     [place] [facility]  [address]    [address]   [address] [place], [place]  [address]    [place], [place]  [address] [phone]  Fax  [phone]  [phone]  Fax [phone]  Problem list: 1. Hypercholesterolemia 2. Palpitations 3. History of chest pain- normal stress Myoview study in October, 2005 4. Diabetes mellitus 5. Hypertension    History of Present Illness:  Todd [name] is a 67 year old gentleman with a history as noted above. I've seen him once here for the past several years. He is remaining quite active. He is fairly healthy. He's been exercising on a fairly regular basis.  He denies episodes of chest pain or shortness of breath.  Feb. 6, 2014: Todd [name] is doing well.  He's not had any episodes of chest pain or shortness breath. He still exercising some but not as much as he would like to.  He gained a little bit of weight on a cruise this past fall and still working on getting that weight off.    Current Outpatient Prescriptions on File Prior to Visit  Medication Sig Dispense Refill  . alfuzosin (UROXATRAL) 10 MG 24 hr tablet Take 10 mg by mouth daily. Per Dr Letha Cape      . aspirin 81 MG tablet Take 81 mg by mouth daily.        . fenofibrate 160 MG tablet TAKE 1 TABLET BY MOUTH EVERY DAY  90 tablet  3  . NON FORMULARY Vemma(Diet Suppliment) QD      . polycarbophil (FIBERCON) 625 MG tablet Take 625 mg by mouth daily. Taking 2 Tablet      . pravastatin (PRAVACHOL) 40 MG tablet Take 1 tablet (40 mg total) by mouth daily.  90 tablet  3    Allergies  Allergen Reactions  . Actifed Cold-Sinus     rash  . Lisinopril Swelling  . Pseudoephedrine     REACTION: hives    Past Medical History  Diagnosis Date  . DIABETES MELLITUS, TYPE II 08/08/2009  . HYPERLIPIDEMIA 08/08/2009  . URINARY INCONTINENCE 08/08/2009  . Palpitations   . Hiatal hernia     Past Surgical History  Procedure Date  .  Appendectomy 1976  . Tonsillectomy 1961  . Achilles tendon repair 2002    rupture  . US echocardiography 02-10-2001    EF 60-65%  . Cardiovascular stress test 11-02-2003    EF 57%    History  Smoking status  . Former Smoker -- 2.0 packs/day for 10 years  . Types: Cigarettes  . Quit date: 04/19/1977  Smokeless tobacco  . Not on file    History  Alcohol Use: Not on file    Family History  Problem Relation Age of Onset  . Mental illness Mother   . Diabetes Sister     type ll    Reviw of Systems:  Reviewed in the HPI.  All other systems are negative.  Physical Exam: Blood pressure 154/86, pulse 89, height 6\' 2"  (1.88 m), weight 226 lb (102.513 kg), SpO2 96.00%. General: Well developed, well nourished, in no acute distress.  Head: Normocephalic, atraumatic, sclera non-icteric, mucus membranes are moist,   Neck: Supple. Negative for carotid bruits. JVD not elevated.  Lungs: Clear bilaterally to auscultation without wheezes, rales, or rhonchi. Breathing is normal.  Heart: RRR with S1 S2. No murmurs, rubs, or gallops  Abdomen: Soft, non-tender, non-distended with normal  bowel sounds. No hepatomegaly. No rebound/guarding. No masses.  Msk:  Strength and tone appear normal for age.  Extremities: No clubbing or cyanosis. No edema.  Distal pedal pulses are 2+ and equal bilaterally.  Neuro: Alert and oriented X 3. Moves all extremities spontaneously.  Psych:  Responds to questions appropriately with a normal affect.  ECG: Normal sinus rhythm. Normal EKG.  Assessment / Plan:

## 2012-02-13 NOTE — Assessment & Plan Note (Signed)
Continue same medications. We'll check levels at his next visit.

## 2012-02-19 ENCOUNTER — Encounter: Payer: Self-pay | Admitting: Family Medicine

## 2012-03-17 ENCOUNTER — Encounter: Payer: Self-pay | Admitting: Family Medicine

## 2012-03-17 MED ORDER — METFORMIN HCL 1000 MG PO TABS: 1000.0000 mg | ORAL_TABLET | Freq: Every day | ORAL | Status: DC

## 2012-03-18 ENCOUNTER — Encounter: Payer: Self-pay | Admitting: Family Medicine

## 2012-03-28 ENCOUNTER — Encounter: Payer: Self-pay | Admitting: Cardiovascular Disease

## 2012-04-24 ENCOUNTER — Encounter: Payer: Self-pay | Admitting: Cardiovascular Disease

## 2012-04-24 ENCOUNTER — Ambulatory Visit (INDEPENDENT_AMBULATORY_CARE_PROVIDER_SITE_OTHER): Payer: Medicare Other | Admitting: Cardiovascular Disease

## 2012-04-24 VITALS — BP 130/80 | HR 68 | Ht 74.0 in | Wt 215.8 lb

## 2012-04-24 DIAGNOSIS — E785 Hyperlipidemia, unspecified: Secondary | ICD-10-CM | POA: Diagnosis not present

## 2012-04-24 DIAGNOSIS — R0789 Other chest pain: Secondary | ICD-10-CM | POA: Insufficient documentation

## 2012-04-24 DIAGNOSIS — I10 Essential (primary) hypertension: Secondary | ICD-10-CM

## 2012-04-24 DIAGNOSIS — R079 Chest pain, unspecified: Secondary | ICD-10-CM | POA: Diagnosis not present

## 2012-04-24 NOTE — Assessment & Plan Note (Addendum)
Todd [name] presents today with some vague chest discomfort.  He has a history of hypertension, diabetes mellitus and hyperlipidemia. These episodes of chest discomfort occurs radically through the day. They're not related to eating, drinking, or exercise.  Given his risk factors, I'm concerned that he may be having some angina. He had a stress test back in 2005 which was unremarkable.  We will schedule him for a stress Myoview study. I'll plan on seeing him again in 6 months for followup visit. We'll see him sooner if the Myoview study is abnormal.

## 2012-04-24 NOTE — Assessment & Plan Note (Signed)
His blood pressure was mildly elevated when he returned from a cruise. He has been doing a lot better with his diet and exercise program. His blood pressure has now normalized.

## 2012-04-24 NOTE — Patient Instructions (Addendum)
Your physician has requested that you have en exercise stress myoview.  Please follow instruction sheet, as given.  Your physician wants you to follow-up in: 6 months  You will receive a reminder letter in the mail two months in advance. If you don't receive a letter, please call our office to schedule the follow-up appointment.  Your physician recommends that you continue on your current medications as directed. Please refer to the Current Medication list given to you today.

## 2012-04-24 NOTE — Assessment & Plan Note (Signed)
Is on low-dose protocol. He does not tolerate higher doses of Pravachol or other statins.

## 2012-04-24 NOTE — Progress Notes (Signed)
Todd [name] Date of Birth  [birth year] [facility]     [place] Office  [address]    [address]   [address] [place], [place]  [address]    [place], [place]  [address] [phone]  Fax  [phone]  [phone]  Fax [phone]  Problem list: 1. Hypercholesterolemia 2. Palpitations 3. History of chest pain- normal stress Myoview study in October, 2005 4. Diabetes mellitus 5. Hypertension    History of Present Illness:  Todd [name] is a 67 year old gentleman with a history as noted above.   He is remaining quite active. He is fairly healthy. He's been exercising on a fairly regular basis.  He denies episodes of chest pain or shortness of breath.  Feb. 6, 2014: [name] is doing well.  He's not had any episodes of chest pain or shortness breath. He still exercising some but not as much as he would like to.  He gained a little bit of weight on a cruise this past fall and still working on getting that weight off.  April, 18, 2014:  He feels ok but has had an unusual sensation across his chest.   It is a similar sensation that he gets in his arms with the pravachol.  He has decreased his dose and feels better.  The sensation is not related to exercise, taking a deep breath, eating or drinking. It is also not related to twisting or turning of his torso..  the sensation is an off and on sensation. to last for a couple of hours.    Current Outpatient Prescriptions on File Prior to Visit  Medication Sig Dispense Refill  . alfuzosin (UROXATRAL) 10 MG 24 hr tablet Take 10 mg by mouth daily. Per Dr Letha Cape      . aspirin 81 MG tablet Take 81 mg by mouth daily.        . Coenzyme Q10 (CO Q 10 PO) Take 200 mg by mouth daily.      . fenofibrate 160 MG tablet TAKE 1 TABLET BY MOUTH EVERY DAY  90 tablet  3  . metFORMIN (GLUCOPHAGE) 1000 MG tablet Take 1 tablet (1,000 mg total) by mouth daily with breakfast.  30 tablet  5  . NON FORMULARY Vemma(Diet Suppliment) QD      .  polycarbophil (FIBERCON) 625 MG tablet Take 625 mg by mouth daily. Taking 2 Tablet      . pravastatin (PRAVACHOL) 40 MG tablet Take 1 tablet (40 mg total) by mouth daily.  90 tablet  3   No current facility-administered medications on file prior to visit.    Allergies  Allergen Reactions  . Actifed Cold-Sinus     rash  . Lisinopril Swelling  . Pseudoephedrine     REACTION: hives    Past Medical History  Diagnosis Date  . DIABETES MELLITUS, TYPE II 08/08/2009  . HYPERLIPIDEMIA 08/08/2009  . URINARY INCONTINENCE 08/08/2009  . Palpitations   . Hiatal hernia     Past Surgical History  Procedure Laterality Date  . Appendectomy  1976  . Tonsillectomy  1961  . Achilles tendon repair  2002    rupture  . US echocardiography  02-10-2001    EF 60-65%  . Cardiovascular stress test  11-02-2003    EF 57%    History  Smoking status  . Former Smoker -- 2.00 packs/day for 10 years  . Types: Cigarettes  . Quit date: 04/19/1977  Smokeless tobacco  . Not on file  History  Alcohol Use: Not on file    Family History  Problem Relation Age of Onset  . Mental illness Mother   . Diabetes Sister     type ll    Reviw of Systems:  Reviewed in the HPI.  All other systems are negative.  Physical Exam: Blood pressure 130/80, pulse 68, height 6\' 2"  (1.88 m), weight 215 lb 12.8 oz (97.886 kg). General: Well developed, well nourished, in no acute distress.  Head: Normocephalic, atraumatic, sclera non-icteric, mucus membranes are moist,   Neck: Supple. Negative for carotid bruits. JVD not elevated.  Lungs: Clear bilaterally to auscultation without wheezes, rales, or rhonchi. Breathing is normal.  Heart: RRR with S1 S2. No murmurs, rubs, or gallops  Abdomen: Soft, non-tender, non-distended with normal bowel sounds. No hepatomegaly. No rebound/guarding. No masses.  Msk:  Strength and tone appear normal for age.  Extremities: No clubbing or cyanosis. No edema.  Distal pedal pulses are  2+ and equal bilaterally.  Neuro: Alert and oriented X 3. Moves all extremities spontaneously.  Psych:  Responds to questions appropriately with a normal affect.  ECG: April 24, 2012:  Normal sinus rhythm at 68 with occasional aberrant conduction..  Assessment / Plan:

## 2012-05-11 ENCOUNTER — Ambulatory Visit (HOSPITAL_COMMUNITY): Payer: Medicare Other | Attending: Cardiology | Admitting: Radiology

## 2012-05-11 VITALS — BP 133/78 | HR 56 | Ht 74.0 in | Wt 212.0 lb

## 2012-05-11 DIAGNOSIS — E785 Hyperlipidemia, unspecified: Secondary | ICD-10-CM | POA: Diagnosis not present

## 2012-05-11 DIAGNOSIS — R079 Chest pain, unspecified: Secondary | ICD-10-CM

## 2012-05-11 DIAGNOSIS — I10 Essential (primary) hypertension: Secondary | ICD-10-CM | POA: Diagnosis not present

## 2012-05-11 DIAGNOSIS — I4949 Other premature depolarization: Secondary | ICD-10-CM

## 2012-05-11 DIAGNOSIS — R5381 Other malaise: Secondary | ICD-10-CM | POA: Diagnosis not present

## 2012-05-11 DIAGNOSIS — Z87891 Personal history of nicotine dependence: Secondary | ICD-10-CM | POA: Diagnosis not present

## 2012-05-11 DIAGNOSIS — R5383 Other fatigue: Secondary | ICD-10-CM | POA: Insufficient documentation

## 2012-05-11 DIAGNOSIS — R0789 Other chest pain: Secondary | ICD-10-CM

## 2012-05-11 DIAGNOSIS — E119 Type 2 diabetes mellitus without complications: Secondary | ICD-10-CM | POA: Diagnosis not present

## 2012-05-11 MED ORDER — TECHNETIUM TC 99M SESTAMIBI GENERIC - CARDIOLITE
10.0000 | Freq: Once | INTRAVENOUS | Status: AC | PRN
  Administered 2012-05-11: 10 via INTRAVENOUS

## 2012-05-11 MED ORDER — TECHNETIUM TC 99M SESTAMIBI GENERIC - CARDIOLITE
30.0000 | Freq: Once | INTRAVENOUS | Status: AC | PRN
  Administered 2012-05-11: 30 via INTRAVENOUS

## 2012-05-11 NOTE — Progress Notes (Signed)
[facility] SITE 3 NUCLEAR MED [address] [place], [place] [address] [phone]    Cardiology Nuclear Med [facility] is a 67 y.o. male     MRN : [id number]     DOB: 1945-09-08  Procedure Date: 05/11/2012  Nuclear Med Background Indication for Stress Test:  Evaluation for Ischemia History:  '03 Echo:EF=65%; '05 OZH:YQMVHQ, EF=57% Cardiac Risk Factors: History of Smoking, Hypertension, Lipids and NIDDM  Symptoms:  Chest Pressure.  (last episode of chest discomfort was about a month ago) and Fatigue   Nuclear Pre-Procedure Caffeine/Decaff Intake:  None NPO After: 7:00pm   Lungs:  Clear. O2 Sat: 98% on room air. IV 0.9% NS with Angio Cath:  22g  IV Site: R Hand  IV Started by:  Doyne Keel, CNMT  Chest Size (in):  44 Cup Size: n/a  Height: 6\' 2"  (1.88 m)  Weight:  212 lb (96.163 kg)  BMI:  Body mass index is 27.21 kg/(m^2). Tech Comments:  n/a    Nuclear Med Study 1 or 2 day study: 1 day  Stress Test Type:  Stress  Reading MD: Marca Ancona, MD  Order Authorizing Provider: Jannette Spanner  Resting Radionuclide: Technetium 53m Sestamibi  Resting Radionuclide Dose: 9.3 mCi   Stress Radionuclide:  Technetium 67m Sestamibi  Stress Radionuclide Dose: 33.0 mCi           Stress Protocol Rest HR: 56 Stress HR: 151  Rest BP: 133/78 Stress BP: 211/77  Exercise Time (min): 8:00 METS: 10.1   Predicted Max HR: 155 bpm % Max HR: 97.42 bpm Rate Pressure Product: 46962   Dose of Adenosine (mg):  n/a Dose of Lexiscan: n/a mg  Dose of Atropine (mg): n/a Dose of Dobutamine: n/a mcg/kg/min (at max HR)  Stress Test Technologist: Smiley Houseman, CMA-N  Nuclear Technologist:  Domenic Polite, CNMT     Rest Procedure:  Myocardial perfusion imaging was performed at rest 45 minutes following the intravenous administration of Technetium 2m Sestamibi.  Rest ECG: NSR - Normal EKG  Stress Procedure:  The patient exercised on the treadmill utilizing the Bruce Protocol  for eight minutes. The patient stopped due to fatigue and denied any chest pain.  Technetium 64m Sestamibi was injected at peak exercise and myocardial perfusion imaging was performed after a brief delay.  Stress ECG: No significant change from baseline ECG. PVCs.   QPS Raw Data Images:  Normal; no motion artifact; normal heart/lung ratio. Stress Images:  Medium-sized, mild inferior perfusion defect.  Rest Images:  Medium-sized, mild inferior perfusion defect.  Subtraction (SDS):  Primarily fixed medium-sized, mild inferior perfusion defect that extends from base to apex.  Transient Ischemic Dilatation (Normal <1.22):  0.85 Lung/Heart Ratio (Normal <0.45):  0.30  Quantitative Gated Spect Images QGS EDV:  118 ml QGS ESV:  46 ml  Impression Exercise Capacity:  Good exercise capacity. BP Response:  Hypertensive blood pressure response. Clinical Symptoms:  Fatigue, dyspnea.  ECG Impression:  No significant ST segment change suggestive of ischemia.  PVCs.  Comparison with Prior Nuclear Study: No images to compare  Overall Impression:  Low risk stress nuclear study.  Fixed medium-sized, mild inferior perfusion defect extending from base to apex.  Normal wall motion suggests that this could be diaphragmatic attenuation but cannot rule out prior inferior MI.  No ischemia.   LV Ejection Fraction: 61%.  LV Wall Motion:  NL LV Function; NL Wall Motion  Marca Ancona 05/11/2012

## 2012-06-04 ENCOUNTER — Ambulatory Visit (INDEPENDENT_AMBULATORY_CARE_PROVIDER_SITE_OTHER): Payer: Medicare Other | Admitting: Family Medicine

## 2012-06-04 ENCOUNTER — Encounter: Payer: Self-pay | Admitting: Family Medicine

## 2012-06-04 VITALS — BP 130/70 | Temp 97.8°F | Wt 215.0 lb

## 2012-06-04 DIAGNOSIS — E785 Hyperlipidemia, unspecified: Secondary | ICD-10-CM

## 2012-06-04 DIAGNOSIS — E119 Type 2 diabetes mellitus without complications: Secondary | ICD-10-CM

## 2012-06-04 DIAGNOSIS — I10 Essential (primary) hypertension: Secondary | ICD-10-CM

## 2012-06-04 LAB — LIPID PANEL
Cholesterol: 149 mg/dL (ref 0–200)
HDL: 40.8 mg/dL (ref >39.00)
LDL Cholesterol: 90 mg/dL (ref 0–99)
Total CHOL/HDL Ratio: 4
Triglycerides: 89 mg/dL (ref 0.0–149.0)
VLDL: 17.8 mg/dL (ref 0.0–40.0)

## 2012-06-04 LAB — HEPATIC FUNCTION PANEL
ALT: 20 U/L (ref 0–53)
AST: 15 U/L (ref 0–37)
Albumin: 4.1 g/dL (ref 3.5–5.2)
Alkaline Phosphatase: 48 U/L (ref 39–117)
Bilirubin, Direct: 0.1 mg/dL (ref 0.0–0.3)
Total Bilirubin: 0.3 mg/dL (ref 0.3–1.2)
Total Protein: 7.3 g/dL (ref 6.0–8.3)

## 2012-06-04 LAB — HEMOGLOBIN A1C: Hgb A1c MFr Bld: 6.6 % — ABNORMAL HIGH (ref 4.6–6.5)

## 2012-06-04 NOTE — Progress Notes (Signed)
  Subjective:    Patient ID: Todd [name], male    DOB: 1945-03-19, 67 y.o.   MRN: [id number]  HPI Medical followup Type 2 diabetes. Last A1c 7.3%. We increased his metformin to 1000 mg daily. Tolerating well. Compliant with therapy. Has lost 11 pounds since last visit due to diet and exercise. Feels better overall. Currently only takes metformin for diabetes. Recent eye exam in January normal.  Patient saw cardiologist recently. Nuclear stress test unremarkable. No recent recurrent chest pains. Exercising without difficulty  Hyperlipidemia. We increased pravastatin 40 mg. No myalgias. Last LDL 109. Patient also takes fenofibrate.  Past Medical History  Diagnosis Date  . DIABETES MELLITUS, TYPE II 08/08/2009  . HYPERLIPIDEMIA 08/08/2009  . URINARY INCONTINENCE 08/08/2009  . Palpitations   . Hiatal hernia    Past Surgical History  Procedure Laterality Date  . Appendectomy  1976  . Tonsillectomy  1961  . Achilles tendon repair  2002    rupture  . US echocardiography  02-10-2001    EF 60-65%  . Cardiovascular stress test  11-02-2003    EF 57%    reports that he quit smoking about 35 years ago. His smoking use included Cigarettes. He has a 20 pack-year smoking history. He does not have any smokeless tobacco history on file. His alcohol and drug histories are not on file. family history includes Diabetes in his sister and Mental illness in his mother. Allergies  Allergen Reactions  . Actifed Cold-Sinus     rash  . Lisinopril Swelling  . Pseudoephedrine     REACTION: hives      Review of Systems  Constitutional: Negative for appetite change, fatigue and unexpected weight change.  Eyes: Negative for visual disturbance.  Respiratory: Negative for cough, chest tightness and shortness of breath.   Cardiovascular: Negative for chest pain, palpitations and leg swelling.  Neurological: Negative for dizziness, syncope, weakness, light-headedness and headaches.       Objective:   Physical Exam  Constitutional: He is oriented to person, place, and time. He appears well-developed and well-nourished.  HENT:  Right Ear: External ear normal.  Left Ear: External ear normal.  Mouth/Throat: Oropharynx is clear and moist.  Neck: Neck supple. No thyromegaly present.  Cardiovascular: Normal rate and regular rhythm.   Pulmonary/Chest: Effort normal and breath sounds normal. No respiratory distress. He has no wheezes. He has no rales.  Musculoskeletal: He exhibits no edema.  Lymphadenopathy:    He has no cervical adenopathy.  Neurological: He is alert and oriented to person, place, and time. No cranial nerve deficit.  Skin:  Feet reveal no skin lesions. Good distal foot pulses. Good capillary refill. No calluses. Normal sensation with monofilament testing           Assessment & Plan:  #1 type 2 diabetes. Recheck A1c. Suspect this will be improved with recent weight loss and increase in metformin #2 hyperlipidemia. Recheck lipid and hepatic panel #3 hypertension stable

## 2012-06-11 ENCOUNTER — Other Ambulatory Visit: Payer: Self-pay | Admitting: Family Medicine

## 2012-06-11 MED ORDER — FENOFIBRATE 160 MG PO TABS: ORAL_TABLET | ORAL | Status: DC

## 2012-07-31 ENCOUNTER — Encounter: Payer: Self-pay | Admitting: Cardiovascular Disease

## 2012-07-31 ENCOUNTER — Other Ambulatory Visit: Payer: Self-pay

## 2012-07-31 ENCOUNTER — Encounter: Payer: Self-pay | Admitting: Family Medicine

## 2012-07-31 MED ORDER — PRAVASTATIN SODIUM 40 MG PO TABS: 40.0000 mg | ORAL_TABLET | Freq: Every day | ORAL | Status: DC

## 2012-10-02 ENCOUNTER — Ambulatory Visit (INDEPENDENT_AMBULATORY_CARE_PROVIDER_SITE_OTHER): Payer: Medicare Other

## 2012-10-02 DIAGNOSIS — Z23 Encounter for immunization: Secondary | ICD-10-CM

## 2012-10-03 ENCOUNTER — Encounter: Payer: Self-pay | Admitting: Family Medicine

## 2012-10-03 ENCOUNTER — Ambulatory Visit (INDEPENDENT_AMBULATORY_CARE_PROVIDER_SITE_OTHER): Payer: Medicare Other | Admitting: Family Medicine

## 2012-10-03 VITALS — BP 130/80 | HR 79 | Temp 97.2°F | Ht 74.0 in | Wt 223.5 lb

## 2012-10-03 DIAGNOSIS — S139XXA Sprain of joints and ligaments of unspecified parts of neck, initial encounter: Secondary | ICD-10-CM

## 2012-10-03 DIAGNOSIS — J069 Acute upper respiratory infection, unspecified: Secondary | ICD-10-CM

## 2012-10-03 DIAGNOSIS — S161XXA Strain of muscle, fascia and tendon at neck level, initial encounter: Secondary | ICD-10-CM | POA: Insufficient documentation

## 2012-10-03 NOTE — Assessment & Plan Note (Signed)
Symptomatic care 

## 2012-10-03 NOTE — Progress Notes (Signed)
  Subjective:    Patient ID: Todd [name], male    DOB: January 14, 1945, 67 y.o.   MRN: [id number]  HPI  67 year old male with DM presents for  1 week of left lateral neck pain ( started after starting new pillow) He has treated with heat and ice.  Pain is increased when he turns head, some radiation to left upper shoulder. No pain down arm or to hand. No numbness, no weakness, nml grip. No pain with ROM of left shoulder.  Has used aspirin for neck pain. Helped minimally.  Woke up yesterday with sore throat, dry, scratchy. No congestion, no cough.    No history of neck problems or surgery.      Review of Systems  Constitutional: Negative for fever and fatigue.  HENT: Negative for ear pain.   Eyes: Negative for pain.  Respiratory: Negative for cough and shortness of breath.   Cardiovascular: Negative for chest pain, palpitations and leg swelling.  Gastrointestinal: Negative for abdominal pain.       Objective:   Physical Exam  Constitutional: Vital signs are normal. He appears well-developed and well-nourished.  HENT:  Head: Normocephalic.  Right Ear: Hearing normal.  Left Ear: Hearing normal.  Nose: Nose normal.  Mouth/Throat: Mucous membranes are normal. Posterior oropharyngeal erythema present. No oropharyngeal exudate or posterior oropharyngeal edema.  Neck: Trachea normal. Muscular tenderness present. No spinous process tenderness present. Carotid bruit is not present. No rigidity. Decreased range of motion present. No erythema present. No mass and no thyromegaly present.  ttp over SCM muscle left  Cardiovascular: Normal rate, regular rhythm and normal pulses.  Exam reveals no gallop, no distant heart sounds and no friction rub.   No murmur heard. No peripheral edema  Pulmonary/Chest: Effort normal and breath sounds normal. No respiratory distress.  Musculoskeletal:       Cervical back: Normal. He exhibits normal range of motion, no tenderness, no bony tenderness, no  deformity and no spasm.  Skin: Skin is warm, dry and intact. No rash noted.  Psychiatric: He has a normal mood and affect. His speech is normal and behavior is normal. Thought content normal.          Assessment & Plan:

## 2012-10-03 NOTE — Assessment & Plan Note (Signed)
No sign of cervical radiculopathy. Start NSAIDs, gentle stretching.

## 2012-10-03 NOTE — Patient Instructions (Signed)
Start ibuprofen 800 mg every every 8 hours as needed for pain. Start gentle stretching of neck. Symptomatic care for cold symptoms.  Call if not improving as expected in 1-2 weeks.

## 2012-10-05 ENCOUNTER — Other Ambulatory Visit: Payer: Self-pay | Admitting: Family Medicine

## 2012-11-09 ENCOUNTER — Encounter: Payer: Self-pay | Admitting: Cardiovascular Disease

## 2012-11-09 ENCOUNTER — Ambulatory Visit (INDEPENDENT_AMBULATORY_CARE_PROVIDER_SITE_OTHER): Payer: Medicare Other | Admitting: Cardiovascular Disease

## 2012-11-09 VITALS — BP 126/72 | HR 70 | Ht 74.0 in | Wt 218.0 lb

## 2012-11-09 DIAGNOSIS — R0789 Other chest pain: Secondary | ICD-10-CM

## 2012-11-09 DIAGNOSIS — E785 Hyperlipidemia, unspecified: Secondary | ICD-10-CM | POA: Diagnosis not present

## 2012-11-09 NOTE — Patient Instructions (Signed)
Your physician wants you to follow-up in: 1 year  You will receive a reminder letter in the mail two months in advance. If you don't receive a letter, please call our office to schedule the follow-up appointment.  Your physician recommends that you continue on your current medications as directed. Please refer to the Current Medication list given to you today.  

## 2012-11-09 NOTE — Progress Notes (Signed)
Todd [name] Date of Birth  1945-09-18 [facility]     [place] Office  [address]    [address]   [address] [place], [place]  [address]    [place], [place]  [address] [phone]  Fax  [phone]  [phone]  Fax [phone]  Problem list: 1. Hypercholesterolemia 2. Palpitations 3. History of chest pain- normal stress Myoview study in October, 2005 4. Diabetes mellitus 5. Hypertension    History of Present Illness:  Todd [name] is a 67 year old gentleman with a history as noted above.   He is remaining quite active. He is fairly healthy. He's been exercising on a fairly regular basis.  He denies episodes of chest pain or shortness of breath.  Feb. 6, 2014: Todd [name] is doing well.  He's not had any episodes of chest pain or shortness breath. He still exercising some but not as much as he would like to.  He gained a little bit of weight on a cruise this past fall and still working on getting that weight off.  April, 18, 2014:  He feels ok but has had an unusual sensation across his chest.   It is a similar sensation that he gets in his arms with the pravachol.  He has decreased his dose and feels better.  The sensation is not related to exercise, taking a deep breath, eating or drinking. It is also not related to twisting or turning of his torso..  the sensation is an off and on sensation. to last for a couple of hours.  Nov. 3, 2014:  Todd [name] is doing well.  Exercising sporadically .      Current Outpatient Prescriptions on File Prior to Visit  Medication Sig Dispense Refill  . alfuzosin (UROXATRAL) 10 MG 24 hr tablet Take 10 mg by mouth daily. Per Dr Letha Cape      . aspirin 81 MG tablet Take 81 mg by mouth daily.        . Coenzyme Q10 (CO Q 10 PO) Take 200 mg by mouth daily.      . fenofibrate 160 MG tablet TAKE 1 TABLET BY MOUTH EVERY DAY  90 tablet  3  . metFORMIN (GLUCOPHAGE) 1000 MG tablet TAKE 1 TABLET BY MOUTH DAILY WITH BREAKFAST  30 tablet  5  . NON  FORMULARY Vemma(Diet Suppliment) QD      . polycarbophil (FIBERCON) 625 MG tablet Take 625 mg by mouth daily. Taking 2 Tablet      . pravastatin (PRAVACHOL) 40 MG tablet Take 1 tablet (40 mg total) by mouth daily.  90 tablet  3   No current facility-administered medications on file prior to visit.    Allergies  Allergen Reactions  . Actifed Cold-Sinus     rash  . Lisinopril Swelling  . Pseudoephedrine     REACTION: hives    Past Medical History  Diagnosis Date  . DIABETES MELLITUS, TYPE II 08/08/2009  . HYPERLIPIDEMIA 08/08/2009  . URINARY INCONTINENCE 08/08/2009  . Palpitations   . Hiatal hernia     Past Surgical History  Procedure Laterality Date  . Appendectomy  1976  . Tonsillectomy  1961  . Achilles tendon repair  2002    rupture  . US echocardiography  02-10-2001    EF 60-65%  . Cardiovascular stress test  11-02-2003    EF 57%    History  Smoking status  . Former Smoker -- 2.00 packs/day for 10 years  . Types: Cigarettes  .  Quit date: 04/19/1977  Smokeless tobacco  . Not on file    History  Alcohol Use  . Yes    Comment: occasionally    Family History  Problem Relation Age of Onset  . Mental illness Mother   . Diabetes Sister     type ll    Reviw of Systems:  Reviewed in the HPI.  All other systems are negative.  Physical Exam: Blood pressure 126/72, pulse 70, height 6\' 2"  (1.88 m), weight 218 lb (98.884 kg). General: Well developed, well nourished, in no acute distress.  Head: Normocephalic, atraumatic, sclera non-icteric, mucus membranes are moist,   Neck: Supple. Negative for carotid bruits. JVD not elevated.  Lungs: Clear bilaterally to auscultation without wheezes, rales, or rhonchi. Breathing is normal.  Heart: RRR with S1 S2. No murmurs, rubs, or gallops  Abdomen: Soft, non-tender, non-distended with normal bowel sounds. No hepatomegaly. No rebound/guarding. No masses.  Msk:  Strength and tone appear normal for age.  Extremities: No  clubbing or cyanosis. No edema.  Distal pedal pulses are 2+ and equal bilaterally.  Neuro: Alert and oriented X 3. Moves all extremities spontaneously.  Psych:  Responds to questions appropriately with a normal affect.  ECG: April 24, 2012:  Normal sinus rhythm at 68 with occasional aberrant conduction..  Assessment / Plan:

## 2012-11-09 NOTE — Assessment & Plan Note (Signed)
His most recent lipid levels are quite good. He is scheduled have repeat lipid levels drawn by his medical doctor next month. We'll continue his current medications.

## 2012-11-09 NOTE — Assessment & Plan Note (Signed)
He's not having any recurrent episodes of chest pain. Continue current medications.

## 2012-11-25 DIAGNOSIS — R350 Frequency of micturition: Secondary | ICD-10-CM | POA: Diagnosis not present

## 2012-11-25 DIAGNOSIS — F524 Premature ejaculation: Secondary | ICD-10-CM | POA: Diagnosis not present

## 2012-11-25 DIAGNOSIS — N320 Bladder-neck obstruction: Secondary | ICD-10-CM | POA: Diagnosis not present

## 2012-12-07 ENCOUNTER — Encounter: Payer: Self-pay | Admitting: Family Medicine

## 2012-12-07 ENCOUNTER — Ambulatory Visit (INDEPENDENT_AMBULATORY_CARE_PROVIDER_SITE_OTHER): Payer: Medicare Other | Admitting: Family Medicine

## 2012-12-07 VITALS — BP 132/70 | HR 72 | Temp 97.7°F | Wt 218.0 lb

## 2012-12-07 DIAGNOSIS — I10 Essential (primary) hypertension: Secondary | ICD-10-CM | POA: Diagnosis not present

## 2012-12-07 DIAGNOSIS — E119 Type 2 diabetes mellitus without complications: Secondary | ICD-10-CM

## 2012-12-07 DIAGNOSIS — E785 Hyperlipidemia, unspecified: Secondary | ICD-10-CM

## 2012-12-07 LAB — LIPID PANEL
Cholesterol: 152 mg/dL (ref 0–200)
HDL: 37.5 mg/dL — ABNORMAL LOW (ref >39.00)
LDL Cholesterol: 89 mg/dL (ref 0–99)
Total CHOL/HDL Ratio: 4
Triglycerides: 130 mg/dL (ref 0.0–149.0)
VLDL: 26 mg/dL (ref 0.0–40.0)

## 2012-12-07 LAB — HEPATIC FUNCTION PANEL
ALT: 28 U/L (ref 0–53)
AST: 16 U/L (ref 0–37)
Albumin: 4.1 g/dL (ref 3.5–5.2)
Alkaline Phosphatase: 42 U/L (ref 39–117)
Bilirubin, Direct: 0.1 mg/dL (ref 0.0–0.3)
Total Bilirubin: 0.5 mg/dL (ref 0.3–1.2)
Total Protein: 7.4 g/dL (ref 6.0–8.3)

## 2012-12-07 LAB — BASIC METABOLIC PANEL
BUN: 16 mg/dL (ref 6–23)
CO2: 29 mEq/L (ref 19–32)
Calcium: 9.4 mg/dL (ref 8.4–10.5)
Chloride: 103 mEq/L (ref 96–112)
Creatinine, Ser: 1.1 mg/dL (ref 0.4–1.5)
GFR: 90.82 mL/min (ref >60.00)
Glucose, Bld: 107 mg/dL — ABNORMAL HIGH (ref 70–99)
Potassium: 4.1 mEq/L (ref 3.5–5.1)
Sodium: 138 mEq/L (ref 135–145)

## 2012-12-07 LAB — HEMOGLOBIN A1C: Hgb A1c MFr Bld: 6.8 % — ABNORMAL HIGH (ref 4.6–6.5)

## 2012-12-07 LAB — HM DIABETES FOOT EXAM: HM Diabetic Foot Exam: NORMAL

## 2012-12-07 NOTE — Progress Notes (Signed)
Pre visit review using our clinic review tool, if applicable. No additional management support is needed unless otherwise documented below in the visit note. 

## 2012-12-07 NOTE — Progress Notes (Signed)
   Subjective:    Patient ID: Todd [name], male    DOB: Jul 16, 1945, 67 y.o.   MRN: [id number]  HPI Followup. Patient has history of type 2 diabetes, hypertension, and hyperlipidemia. Recently saw cardiologist. No further testing done. No recent chest pains. Blood sugars been stable. No symptoms of hyperglycemia. Remains on metformin 1000 mg daily. Last A1c 6.6%. Patient is saying ophthalmologist regularly. No history of any peripheral neuropathy  Hyperlipidemia treated with pravastatin. Lipids have been stable. No recent myalgias. He has history of borderline hypertension the past.  Past Medical History  Diagnosis Date  . DIABETES MELLITUS, TYPE II 08/08/2009  . HYPERLIPIDEMIA 08/08/2009  . URINARY INCONTINENCE 08/08/2009  . Palpitations   . Hiatal hernia    Past Surgical History  Procedure Laterality Date  . Appendectomy  1976  . Tonsillectomy  1961  . Achilles tendon repair  2002    rupture  . US echocardiography  02-10-2001    EF 60-65%  . Cardiovascular stress test  11-02-2003    EF 57%    reports that he quit smoking about 35 years ago. His smoking use included Cigarettes. He has a 20 pack-year smoking history. He does not have any smokeless tobacco history on file. He reports that he drinks alcohol. His drug history is not on file. family history includes Diabetes in his sister; Mental illness in his mother. Allergies  Allergen Reactions  . Actifed Cold-Sinus     rash  . Lisinopril Swelling  . Pseudoephedrine     REACTION: hives      Review of Systems  Constitutional: Negative for fatigue and unexpected weight change.  Eyes: Negative for visual disturbance.  Respiratory: Negative for cough, chest tightness and shortness of breath.   Cardiovascular: Negative for chest pain, palpitations and leg swelling.  Endocrine: Negative for polydipsia and polyuria.  Neurological: Negative for dizziness, syncope, weakness, light-headedness and headaches.       Objective:   Physical Exam  Constitutional: He is oriented to person, place, and time. He appears well-developed and well-nourished.  HENT:  Right Ear: External ear normal.  Left Ear: External ear normal.  Mouth/Throat: Oropharynx is clear and moist.  Eyes: Pupils are equal, round, and reactive to light.  Neck: Neck supple. No thyromegaly present.  Cardiovascular: Normal rate and regular rhythm.   Pulmonary/Chest: Effort normal and breath sounds normal. No respiratory distress. He has no wheezes. He has no rales.  Musculoskeletal: He exhibits no edema.  Neurological: He is alert and oriented to person, place, and time.          Assessment & Plan:  #1 type 2 diabetes. History of good control. Recheck A1c. Continue yearly exam. #2 dyslipidemia. Repeat lipid and hepatic panel #3 history of borderline hypertension currently stable. Continue regular exercise habits.

## 2012-12-30 ENCOUNTER — Telehealth: Payer: Self-pay | Admitting: Family Medicine

## 2012-12-30 MED ORDER — METFORMIN HCL 1000 MG PO TABS: ORAL_TABLET | ORAL | Status: DC

## 2012-12-30 MED ORDER — FENOFIBRATE 160 MG PO TABS: ORAL_TABLET | ORAL | Status: DC

## 2012-12-30 MED ORDER — PRAVASTATIN SODIUM 40 MG PO TABS: 40.0000 mg | ORAL_TABLET | Freq: Every day | ORAL | Status: DC

## 2012-12-30 NOTE — Telephone Encounter (Signed)
[facility] called for pt. pt is new w/ them and they need a faxed rx for each of the following fenofibrate 160 MG tablet  metFORMIN (GLUCOPHAGE) 1000 MG tablet pravastatin (PRAVACHOL) 40 MG tablet All 90 day  / all once/ day

## 2012-12-30 NOTE — Telephone Encounter (Signed)
RXs sent to [facility]

## 2013-02-26 ENCOUNTER — Ambulatory Visit (INDEPENDENT_AMBULATORY_CARE_PROVIDER_SITE_OTHER): Payer: Medicare Other | Admitting: Family Medicine

## 2013-02-26 ENCOUNTER — Encounter: Payer: Self-pay | Admitting: Family Medicine

## 2013-02-26 VITALS — BP 140/82 | HR 90 | Temp 97.8°F | Ht 74.0 in | Wt 223.0 lb

## 2013-02-26 DIAGNOSIS — B9789 Other viral agents as the cause of diseases classified elsewhere: Secondary | ICD-10-CM

## 2013-02-26 DIAGNOSIS — B349 Viral infection, unspecified: Secondary | ICD-10-CM

## 2013-02-26 LAB — CBC WITH DIFFERENTIAL/PLATELET
Basophils Absolute: 0 10*3/uL (ref 0.0–0.1)
Basophils Relative: 0.4 % (ref 0.0–3.0)
Eosinophils Absolute: 0.1 10*3/uL (ref 0.0–0.7)
Eosinophils Relative: 0.9 % (ref 0.0–5.0)
HCT: 44 % (ref 39.0–52.0)
Hemoglobin: 14.2 g/dL (ref 13.0–17.0)
Lymphocytes Relative: 34.2 % (ref 12.0–46.0)
Lymphs Abs: 2 10*3/uL (ref 0.7–4.0)
MCHC: 32.3 g/dL (ref 30.0–36.0)
MCV: 88.6 fl (ref 78.0–100.0)
Monocytes Absolute: 0.6 10*3/uL (ref 0.1–1.0)
Monocytes Relative: 10.5 % (ref 3.0–12.0)
Neutro Abs: 3.1 10*3/uL (ref 1.4–7.7)
Neutrophils Relative %: 54 % (ref 43.0–77.0)
Platelets: 252 10*3/uL (ref 150.0–400.0)
RBC: 4.96 Mil/uL (ref 4.22–5.81)
RDW: 14.3 % (ref 11.5–14.6)
WBC: 5.8 10*3/uL (ref 4.5–10.5)

## 2013-02-26 MED ORDER — AZITHROMYCIN 250 MG PO TABS
ORAL_TABLET | ORAL | Status: DC
Start: 2013-02-26 — End: 2013-06-09

## 2013-02-26 NOTE — Progress Notes (Signed)
   Subjective:    Patient ID: Todd [name], male    DOB: 31-Jul-1945, 68 y.o.   MRN: [id number]  HPI Here for 2 days of a posterior HA, a ST, and some generalized fatigue. No fever or cough. No body aches. He is using Tylenol. 2 weeks ago he had similar sx and they went away after about a week.    Review of Systems  Constitutional: Positive for fatigue. Negative for fever, chills and diaphoresis.  HENT: Positive for postnasal drip and sore throat. Negative for sinus pressure.   Eyes: Negative.   Respiratory: Negative.        Objective:   Physical Exam  Constitutional: He appears well-developed and well-nourished. No distress.  HENT:  Right Ear: External ear normal.  Left Ear: External ear normal.  Nose: Nose normal.  Mouth/Throat: Oropharynx is clear and moist. No oropharyngeal exudate.  Eyes: Conjunctivae are normal. Pupils are equal, round, and reactive to light.  Neck: Normal range of motion. Neck supple. No thyromegaly present.  Pulmonary/Chest: Effort normal and breath sounds normal.  Lymphadenopathy:    He has no cervical adenopathy.          Assessment & Plan:  This sounds like a viral illness, possibly a mild form of mononucleosis. We will get EBV titers and a CBC. He is leaving tomorrow for [facility] for a vacation and he is nervous about whether he may need an antibiotic. I printed out a rx for a Zpack that he can take with him and use only if he gets any worse. Drink fluids and use Tylenol prn.

## 2013-02-26 NOTE — Progress Notes (Signed)
Pre visit review using our clinic review tool, if applicable. No additional management support is needed unless otherwise documented below in the visit note. 

## 2013-02-27 LAB — EPSTEIN-BARR VIRUS VCA, IGM: EBV VCA IgM: <10 U/mL (ref <36.0)

## 2013-02-27 LAB — EPSTEIN-BARR VIRUS VCA, IGG: EBV VCA IgG: >750 U/mL — ABNORMAL HIGH (ref <18.0)

## 2013-03-01 ENCOUNTER — Encounter: Payer: Self-pay | Admitting: Family Medicine

## 2013-03-29 LAB — HM DIABETES EYE EXAM

## 2013-04-15 ENCOUNTER — Other Ambulatory Visit: Payer: Self-pay

## 2013-06-09 ENCOUNTER — Ambulatory Visit (INDEPENDENT_AMBULATORY_CARE_PROVIDER_SITE_OTHER): Payer: Medicare Other | Admitting: Family Medicine

## 2013-06-09 ENCOUNTER — Encounter: Payer: Self-pay | Admitting: Family Medicine

## 2013-06-09 VITALS — BP 134/72 | HR 75 | Temp 97.8°F | Wt 226.0 lb

## 2013-06-09 DIAGNOSIS — Z125 Encounter for screening for malignant neoplasm of prostate: Secondary | ICD-10-CM | POA: Diagnosis not present

## 2013-06-09 DIAGNOSIS — E785 Hyperlipidemia, unspecified: Secondary | ICD-10-CM

## 2013-06-09 DIAGNOSIS — E119 Type 2 diabetes mellitus without complications: Secondary | ICD-10-CM

## 2013-06-09 DIAGNOSIS — M25519 Pain in unspecified shoulder: Secondary | ICD-10-CM | POA: Diagnosis not present

## 2013-06-09 DIAGNOSIS — M25511 Pain in right shoulder: Secondary | ICD-10-CM

## 2013-06-09 DIAGNOSIS — M25512 Pain in left shoulder: Secondary | ICD-10-CM

## 2013-06-09 LAB — PSA: PSA: 4.15 ng/mL — ABNORMAL HIGH (ref 0.10–4.00)

## 2013-06-09 LAB — HEMOGLOBIN A1C: Hgb A1c MFr Bld: 7.1 % — ABNORMAL HIGH (ref 4.6–6.5)

## 2013-06-09 NOTE — Progress Notes (Signed)
   Subjective:    Patient ID: Todd [name], male    DOB: 17-Aug-1945, 68 y.o.   MRN: [id number]  HPI Patient seen today for the following issues  Type 2 diabetes. History of excellent control. Most recent A1c 6.8%. Does not monitor blood sugars regularly. Has had some recent mild weight gain. No consistent exercise. No symptoms of hyperglycemia. He remains on metformin  Dyslipidemia. Patient takes combination of pravastatin and fenofibrate. No history of CAD or peripheral vascular disease. No chest pains. Nonsmoker.  Complaint of bilateral shoulder pains. Has pain only when abducting for prolonged periods of several seconds. Does not have any pain with external rotation or internal rotation. No definite weakness. No neck pain. No upper extremity numbness or weakness. No recent overuse activities. No significant night pain.  Past Medical History  Diagnosis Date  . DIABETES MELLITUS, TYPE II 08/08/2009  . HYPERLIPIDEMIA 08/08/2009  . URINARY INCONTINENCE 08/08/2009  . Palpitations   . Hiatal hernia    Past Surgical History  Procedure Laterality Date  . Appendectomy  1976  . Tonsillectomy  1961  . Achilles tendon repair  2002    rupture  . US echocardiography  02-10-2001    EF 60-65%  . Cardiovascular stress test  11-02-2003    EF 57%    reports that he quit smoking about 36 years ago. His smoking use included Cigarettes. He has a 20 pack-year smoking history. He has never used smokeless tobacco. He reports that he drinks alcohol. He reports that he does not use illicit drugs. family history includes Diabetes in his sister; Mental illness in his mother. Allergies  Allergen Reactions  . Actifed Cold-Sinus     rash  . Lisinopril Swelling  . Pseudoephedrine     REACTION: hives      Review of Systems  Constitutional: Negative for fatigue.  Eyes: Negative for visual disturbance.  Respiratory: Negative for cough, chest tightness and shortness of breath.   Cardiovascular: Negative for  chest pain, palpitations and leg swelling.  Endocrine: Negative for polydipsia and polyuria.  Neurological: Negative for dizziness, syncope, weakness, light-headedness and headaches.       Objective:   Physical Exam  Constitutional: He appears well-developed and well-nourished.  Cardiovascular: Normal rate and regular rhythm.  Exam reveals no gallop.   No murmur heard. Pulmonary/Chest: Effort normal and breath sounds normal. No respiratory distress. He has no wheezes. He has no rales.  Musculoskeletal: He exhibits no edema.  Full range of motion both shoulders. No localized tenderness. No definite weakness.          Assessment & Plan:  #1 type 2 diabetes. History of good control. Repeat A1c. Recent eye exam no retinopathy changes #2 dyslipidemia. Continue pravastatin and fenofibrate. Recheck lipids at followup in 6 months #3 bilateral shoulder pain. He has excellent range of motion and no reproducible pain at this time but has had several months of intermittent pain. Set up sports medicine referral to further evaluate #4 health maintenance. Patient requesting PSA testing.

## 2013-06-09 NOTE — Progress Notes (Signed)
Pre visit review using our clinic review tool, if applicable. No additional management support is needed unless otherwise documented below in the visit note. 

## 2013-06-10 ENCOUNTER — Encounter: Payer: Self-pay | Admitting: Family Medicine

## 2013-06-12 ENCOUNTER — Encounter: Payer: Self-pay | Admitting: Family Medicine

## 2013-06-14 ENCOUNTER — Other Ambulatory Visit: Payer: Self-pay | Admitting: Family Medicine

## 2013-06-14 DIAGNOSIS — M25512 Pain in left shoulder: Principal | ICD-10-CM

## 2013-06-14 DIAGNOSIS — M25511 Pain in right shoulder: Secondary | ICD-10-CM

## 2013-06-16 ENCOUNTER — Ambulatory Visit (INDEPENDENT_AMBULATORY_CARE_PROVIDER_SITE_OTHER): Payer: Medicare Other | Admitting: Family Medicine

## 2013-06-16 ENCOUNTER — Ambulatory Visit (INDEPENDENT_AMBULATORY_CARE_PROVIDER_SITE_OTHER)
Admission: RE | Admit: 2013-06-16 | Discharge: 2013-06-16 | Disposition: A | Discharge status: Home / Self Care | Payer: Medicare Other | Source: Ambulatory Visit | Attending: Family Medicine | Admitting: Family Medicine

## 2013-06-16 ENCOUNTER — Other Ambulatory Visit (INDEPENDENT_AMBULATORY_CARE_PROVIDER_SITE_OTHER): Payer: Medicare Other

## 2013-06-16 ENCOUNTER — Encounter: Payer: Self-pay | Admitting: Family Medicine

## 2013-06-16 VITALS — BP 142/84 | HR 74 | Ht 74.0 in | Wt 227.0 lb

## 2013-06-16 DIAGNOSIS — M47812 Spondylosis without myelopathy or radiculopathy, cervical region: Secondary | ICD-10-CM | POA: Diagnosis not present

## 2013-06-16 DIAGNOSIS — M25519 Pain in unspecified shoulder: Secondary | ICD-10-CM

## 2013-06-16 DIAGNOSIS — M25511 Pain in right shoulder: Secondary | ICD-10-CM

## 2013-06-16 DIAGNOSIS — M25512 Pain in left shoulder: Principal | ICD-10-CM

## 2013-06-16 MED ORDER — MELOXICAM 15 MG PO TABS: 15.0000 mg | ORAL_TABLET | Freq: Every day | ORAL | Status: DC

## 2013-06-16 NOTE — Assessment & Plan Note (Signed)
Patient bilateral shoulder pain is likely secondary to more of his postural changes. I do think there is a possibility that this could be secondary to cervical radiculopathy. Patient will get neck x-rays as well. We discussed changing his work in position. Patient was given postural exercises, we discussed over-the-counter medications he can be beneficial and we did give him a trial of some anti-inflammatories. Patient is going to try these interventions and come back again in 3 weeks' time. Patient continues to have pain we may need to consider osteopathic manipulation or further imaging.

## 2013-06-16 NOTE — Progress Notes (Signed)
[facility] [address], [place] [address] Phone: [phone] Subjective:    I'm seeing this patient by the request  of:  [name], MD   CC: Bilateral shoulder pain  NTI:[id number] Todd [name] is a 68 y.o. male coming in with complaint of bilateral shoulder pain. Patient was seen by primary care provider and was sent here for further evaluation. Patient states that for several weeks or months he has some discomfort especially with abduction of the arms. Patient does work at home and works on his computer a regular basis. Patient states after working for multiple minutes he can have some discomfort in has to change positions fairly frequently. Patient denies any radiation down the arm or any numbness or tingling. Patient states though sometimes laying down he has to change positions as well due to the discomfort. Patient has tried some over-the-counter medications with no significant benefit. Patient has had a history of a herniated or bulging disc in his lower back that did respond well to conservative therapy. Patient rates the severity of 4/10. Describes it once again is more of a discomfort in full neck and sensation then a true pain.     Past medical history, social, surgical and family history all reviewed in electronic medical record.   Review of Systems: No headache, visual changes, nausea, vomiting, diarrhea, constipation, dizziness, abdominal pain, skin rash, fevers, chills, night sweats, weight loss, swollen lymph nodes, body aches, joint swelling, muscle aches, chest pain, shortness of breath, mood changes.   Objective There were no vitals taken for this visit.  General: No apparent distress alert and oriented x3 mood and affect normal, dressed appropriately.  HEENT: Pupils equal, extraocular movements intact  Respiratory: Patient's speak in full sentences and does not appear short of breath  Cardiovascular: No lower extremity  edema, non tender, no erythema  Skin: Warm dry intact with no signs of infection or rash on extremities or on axial skeleton.  Abdomen: Soft nontender  Neuro: Cranial nerves II through XII are intact, neurovascularly intact in all extremities with 2+ DTRs and 2+ pulses.  Lymph: No lymphadenopathy of posterior or anterior cervical chain or axillae bilaterally.  Gait normal with good balance and coordination.  MSK:  Non tender with full range of motion and good stability and symmetric strength and tone of  elbows, wrist, hip, knee and ankles bilaterally.  Neck: Inspection reveals mild increased lordosis of the cervical spine. No palpable stepoffs. Positive Spurling's maneuver on right. Range of motion shows patient does have decreased extension lacking the last 10. Grip strength and sensation normal in bilateral hands Strength good C4 to T1 distribution No sensory change to C4 to T1 Negative Hoffman sign bilaterally Reflexes normal  Shoulder: Bilateral Inspection reveals no abnormalities, atrophy or asymmetry. Palpation is normal with no tenderness over AC joint or bicipital groove. ROM is full in all planes. Rotator cuff strength normal throughout. No signs of impingement with negative Neer and Hawkin's tests, empty can sign. Speeds and Yergason's tests normal. No labral pathology noted with negative Obrien's, negative clunk and good stability. Normal scapular function observed. No painful arc and no drop arm sign. No apprehension sign  MSK US performed of: Right shoulder This study was ordered, performed, and interpreted by Charlann Boxer D.O.  Shoulder:   Supraspinatus:  Appears normal on long and transverse views, no bursal bulge seen with shoulder abduction on impingement view. Infraspinatus:  Appears normal on long and transverse views. Subscapularis:  Appears normal on long and transverse views. Teres Minor:  Appears normal on long and transverse views. AC joint:  Capsule  undistended, no geyser sign. Glenohumeral Joint:  Appears normal without effusion. Glenoid Labrum:  Intact without visualized tears. Biceps Tendon:  Appears normal on long and transverse views, no fraying of tendon, tendon located in intertubercular groove, no subluxation with shoulder internal or external rotation. No increased power doppler signal. Impression: Normal ultrasound     Impression and Recommendations:     This case required medical decision making of moderate complexity.   ;

## 2013-06-16 NOTE — Patient Instructions (Addendum)
Very nice to meet you.  Ice 20 minutes after activity can be helpful.  Xrays downstairs today.   wall heels, butt shoulders and head touching for total of 5 minutes daily 5# weight bend over at 45 degrees and pull up with elbows in pulling shoulder blades together.  Hold for 2 seconds then down for count of 4 repeat 10 reps 2 sets most days of the week Neck exercises in handout 3-4 times a week Vitamin D 2000 IU daily Turmeric 500mg  twice daily.  meloxicam daily for 10 days then as needed Come back in 2-3 weeks.

## 2013-06-18 ENCOUNTER — Encounter: Payer: Self-pay | Admitting: Family Medicine

## 2013-06-18 MED ORDER — MELOXICAM 15 MG PO TABS: 15.0000 mg | ORAL_TABLET | Freq: Every day | ORAL | Status: DC

## 2013-06-24 ENCOUNTER — Encounter: Payer: Self-pay | Admitting: Family Medicine

## 2013-06-24 DIAGNOSIS — M5412 Radiculopathy, cervical region: Secondary | ICD-10-CM

## 2013-07-04 ENCOUNTER — Ambulatory Visit
Admission: RE | Admit: 2013-07-04 | Discharge: 2013-07-04 | Disposition: A | Discharge status: Home / Self Care | Payer: Medicare Other | Source: Ambulatory Visit | Attending: Family Medicine | Admitting: Family Medicine

## 2013-07-04 DIAGNOSIS — M4802 Spinal stenosis, cervical region: Secondary | ICD-10-CM | POA: Diagnosis not present

## 2013-07-04 DIAGNOSIS — M5412 Radiculopathy, cervical region: Secondary | ICD-10-CM

## 2013-07-04 DIAGNOSIS — M5020 Other cervical disc displacement, unspecified cervical region: Secondary | ICD-10-CM | POA: Diagnosis not present

## 2013-07-04 DIAGNOSIS — M5030 Other cervical disc degeneration, unspecified cervical region: Secondary | ICD-10-CM | POA: Diagnosis not present

## 2013-07-05 ENCOUNTER — Encounter: Payer: Self-pay | Admitting: Family Medicine

## 2013-07-05 ENCOUNTER — Ambulatory Visit (INDEPENDENT_AMBULATORY_CARE_PROVIDER_SITE_OTHER): Payer: Medicare Other | Admitting: Family Medicine

## 2013-07-05 VITALS — BP 144/86 | HR 68 | Ht 74.0 in | Wt 228.0 lb

## 2013-07-05 DIAGNOSIS — M5412 Radiculopathy, cervical region: Secondary | ICD-10-CM

## 2013-07-05 DIAGNOSIS — M5010 Cervical disc disorder with radiculopathy, unspecified cervical region: Secondary | ICD-10-CM | POA: Insufficient documentation

## 2013-07-05 MED ORDER — TRAMADOL HCL 50 MG PO TABS: 50.0000 mg | ORAL_TABLET | Freq: Every evening | ORAL | Status: DC | PRN

## 2013-07-05 NOTE — Progress Notes (Signed)
  [facility] [address], [place] [address] Phone: [phone] Subjective:      CC: Bilateral shoulder pain and neck pain followup.  DJS:[id number] Todd [name] is a 68 y.o. male coming in with complaint of bilateral shoulder pain. The patient was previously seen and there is concern for cervical radiculopathy. 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 Patient continued to have pain so an MRI was ordered. Patient's MRI was reviewed by me today. Patient did have a right E. centric circumferential disc osteophyte complex giving some spinal stenosis with mild flattening of the cord with severe right C5 foraminal stenosis. Patient ulcer has other degenerative changes at multiple levels. Patient states that the pain has not change drastically. Patient is intermittently doing exercises but has not icing and taking any medicine at this time. Patient is still able to do all activities of daily living but is has a constant dull aching sensation at all times but denies any numbness or weakness.    Past medical history, social, surgical and family history all reviewed in electronic medical record.   Review of Systems: No headache, visual changes, nausea, vomiting, diarrhea, constipation, dizziness, abdominal pain, skin rash, fevers, chills, night sweats, weight loss, swollen lymph nodes, body aches, joint swelling, muscle aches, chest pain, shortness of breath, mood changes.   Objective Blood pressure 144/86, pulse 68, height 6\' 2"  (1.88 m), weight 228 lb (103.42 kg), SpO2 97.00%.  General: No apparent distress alert and oriented x3 mood and affect normal, dressed appropriately.  HEENT: Pupils equal, extraocular movements intact  Respiratory: Patient's speak in full sentences and does not appear short of breath  Cardiovascular: No lower extremity edema, non tender, no erythema  Skin: Warm dry  intact with no signs of infection or rash on extremities or on axial skeleton.  Abdomen: Soft nontender  Neuro: Cranial nerves II through XII are intact, neurovascularly intact in all extremities with 2+ DTRs and 2+ pulses.  Lymph: No lymphadenopathy of posterior or anterior cervical chain or axillae bilaterally.  Gait normal with good balance and coordination.  MSK:  Non tender with full range of motion and good stability and symmetric strength and tone of  shoulders ,elbows, wrist, hip, knee and ankles bilaterally.  Neck: Inspection reveals mild increased lordosis of the cervical spine. No palpable stepoffs. Positive Spurling's maneuver on right. Range of motion shows patient does have decreased extension lacking the last 10. Grip strength and sensation normal in bilateral hands Strength good C4 to T1 distribution No sensory change to C4 to T1 Negative Hoffman sign bilaterally Reflexes normal     Impression and Recommendations:     This case required medical decision making of moderate complexity.   ;

## 2013-07-05 NOTE — Assessment & Plan Note (Signed)
Patient's symptoms to correspond very well with his advanced imaging. We discussed different approaches including conservative approach, epidural injections, or surgical intervention. Patient has elected do conservative therapy. Patient will continue the home exercises more religiously as well as start formal physical therapy. We discussed icing protocol he could be beneficial and over-the-counter medications it to be helpful. Patient was given some tramadol to try while in long car rides. We also discussed the possibility of gabapentin which patient declined at this time but would refill it patient requests. Patient is going to try these interventions and come back again in 3-4 weeks for further evaluation and treatment.  Spent greater than 25 minutes with patient face-to-face and had greater than 50% of counseling including as described above in assessment and plan.

## 2013-07-05 NOTE — Patient Instructions (Signed)
Good to see you.  Try tramadol up to 2 times daily as needed Continue the positioning at work Physical therapy will be calling you Ice is still helpful Continue the home exercises as well near daily.  Checkin in 4-6 weeks.  Look up gabapentin and I will fill if you want.

## 2013-07-08 ENCOUNTER — Ambulatory Visit: Payer: Medicare Other | Attending: Family Medicine | Admitting: Physical Therapy

## 2013-07-08 DIAGNOSIS — R293 Abnormal posture: Secondary | ICD-10-CM | POA: Diagnosis not present

## 2013-07-08 DIAGNOSIS — M542 Cervicalgia: Secondary | ICD-10-CM | POA: Insufficient documentation

## 2013-07-16 DIAGNOSIS — R972 Elevated prostate specific antigen [PSA]: Secondary | ICD-10-CM | POA: Diagnosis not present

## 2013-07-19 ENCOUNTER — Ambulatory Visit: Payer: Medicare Other | Admitting: Physical Therapy

## 2013-07-19 DIAGNOSIS — M542 Cervicalgia: Secondary | ICD-10-CM | POA: Diagnosis not present

## 2013-07-22 ENCOUNTER — Ambulatory Visit: Payer: Medicare Other | Admitting: Physical Therapy

## 2013-07-26 ENCOUNTER — Ambulatory Visit: Payer: Medicare Other | Admitting: Physical Therapy

## 2013-07-26 DIAGNOSIS — M542 Cervicalgia: Secondary | ICD-10-CM | POA: Diagnosis not present

## 2013-07-29 ENCOUNTER — Ambulatory Visit: Payer: Medicare Other | Admitting: Rehabilitation

## 2013-07-29 DIAGNOSIS — M542 Cervicalgia: Secondary | ICD-10-CM | POA: Diagnosis not present

## 2013-08-03 ENCOUNTER — Encounter: Payer: Medicare Other | Admitting: Rehabilitation

## 2013-08-09 ENCOUNTER — Encounter: Payer: Self-pay | Admitting: Family Medicine

## 2013-08-09 ENCOUNTER — Ambulatory Visit (INDEPENDENT_AMBULATORY_CARE_PROVIDER_SITE_OTHER): Payer: Medicare Other | Admitting: Family Medicine

## 2013-08-09 VITALS — BP 146/80 | HR 77 | Ht 74.0 in | Wt 229.0 lb

## 2013-08-09 DIAGNOSIS — M5412 Radiculopathy, cervical region: Secondary | ICD-10-CM | POA: Diagnosis not present

## 2013-08-09 DIAGNOSIS — M5010 Cervical disc disorder with radiculopathy, unspecified cervical region: Secondary | ICD-10-CM

## 2013-08-09 MED ORDER — AMBULATORY NON FORMULARY MEDICATION: Status: DC

## 2013-08-09 NOTE — Progress Notes (Signed)
  [facility] [address], [place] [address] Phone: [phone] Subjective:      CC: Bilateral shoulder pain and neck pain followup.  PNT:[id number] Todd [name] is a 68 y.o. male coming in with complaint of bilateral shoulder pain. The patient was previously seen and there is concern for cervical radiculopathy. 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 Patient continued to have pain so an MRI was ordered. 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  Patient has been going to formal physical therapy a regular basis as well as changes were positioning. Patient states that he is feeling much better. Patient states that he will do all activities of daily living it is sleeping comfortably. Patient states that he does not feel quite some fatigue. Patient has done some strengthening exercises has been beneficial as well. The patient would like to increase his activity because he is gaining weight recently and would like to lose some. Denies any new symptoms. Still some dull aching sensation in the shoulders bilaterally right greater than left but much better overall.    Past medical history, social, surgical and family history all reviewed in electronic medical record.   Review of Systems: No headache, visual changes, nausea, vomiting, diarrhea, constipation, dizziness, abdominal pain, skin rash, fevers, chills, night sweats, weight loss, swollen lymph nodes, body aches, joint swelling, muscle aches, chest pain, shortness of breath, mood changes.   Objective Blood pressure 146/80, pulse 77, height 6\' 2"  (1.88 m), weight 229 lb (103.874 kg), SpO2 97.00%.  General: No apparent distress alert and oriented x3 mood and affect normal, dressed appropriately.  HEENT: Pupils  equal, extraocular movements intact  Respiratory: Patient's speak in full sentences and does not appear short of breath  Cardiovascular: No lower extremity edema, non tender, no erythema  Skin: Warm dry intact with no signs of infection or rash on extremities or on axial skeleton.  Abdomen: Soft nontender  Neuro: Cranial nerves II through XII are intact, neurovascularly intact in all extremities with 2+ DTRs and 2+ pulses.  Lymph: No lymphadenopathy of posterior or anterior cervical chain or axillae bilaterally.  Gait normal with good balance and coordination.  MSK:  Non tender with full range of motion and good stability and symmetric strength and tone of  shoulders ,elbows, wrist, hip, knee and ankles bilaterally.  Neck: Inspection reveals mild increased lordosis of the cervical spine. No palpable stepoffs. Positive Spurling's maneuver on right still present. Range of motion shows patient does have decreased extension lacking the last 10. Grip strength and sensation normal in bilateral hands Strength good C4 to T1 distribution No sensory change to C4 to T1 Negative Hoffman sign bilaterally Reflexes normal No significant change from previous exam     Impression and Recommendations:     This case required medical decision making of moderate complexity.   ;

## 2013-08-09 NOTE — Patient Instructions (Signed)
Keep doing what you are doing.  We have a lot of tricks up our sleeves You are doing great.  Try the home traction possibly Finish the physical therapy.  Home exercises still.  Keep the vitamin D  Lets say 6 weeks.

## 2013-08-09 NOTE — Assessment & Plan Note (Signed)
Patient is doing better overall. Patient's physical exam does not have any significant change. Still no weakness or neuropathy noted. Encourage patient to finish up her physical therapy and continued home exercises. We discussed about postural changes as well as postural exercises are to be beneficial. Patient will try these interventions and continue with the over-the-counter supplementation. Patient will follow up again in 4-6 weeks for further evaluation and treatment  Spent greater than 25 minutes with patient face-to-face and had greater than 50% of counseling including as described above in assessment and plan.

## 2013-08-10 ENCOUNTER — Ambulatory Visit: Payer: Medicare Other | Attending: Family Medicine | Admitting: Physical Therapy

## 2013-08-10 DIAGNOSIS — M542 Cervicalgia: Secondary | ICD-10-CM | POA: Insufficient documentation

## 2013-08-10 DIAGNOSIS — R293 Abnormal posture: Secondary | ICD-10-CM | POA: Diagnosis not present

## 2013-08-12 ENCOUNTER — Encounter: Payer: Medicare Other | Admitting: Rehabilitation

## 2013-09-06 ENCOUNTER — Ambulatory Visit (INDEPENDENT_AMBULATORY_CARE_PROVIDER_SITE_OTHER): Payer: Medicare Other | Admitting: Family Medicine

## 2013-09-06 ENCOUNTER — Encounter: Payer: Self-pay | Admitting: Family Medicine

## 2013-09-06 VITALS — BP 140/82 | HR 80 | Wt 232.0 lb

## 2013-09-06 DIAGNOSIS — Z23 Encounter for immunization: Secondary | ICD-10-CM

## 2013-09-06 DIAGNOSIS — R635 Abnormal weight gain: Secondary | ICD-10-CM | POA: Diagnosis not present

## 2013-09-06 DIAGNOSIS — E785 Hyperlipidemia, unspecified: Secondary | ICD-10-CM

## 2013-09-06 DIAGNOSIS — I10 Essential (primary) hypertension: Secondary | ICD-10-CM

## 2013-09-06 DIAGNOSIS — E119 Type 2 diabetes mellitus without complications: Secondary | ICD-10-CM

## 2013-09-06 NOTE — Patient Instructions (Addendum)
Lose some weight and establish more regular exercise. Continue to monitor blood pressure and be in touch if systolic blood pressure consistently > 150

## 2013-09-06 NOTE — Progress Notes (Signed)
Pre visit review using our clinic review tool, if applicable. No additional management support is needed unless otherwise documented below in the visit note. 

## 2013-09-06 NOTE — Progress Notes (Signed)
   Subjective:    Patient ID: Todd [name], male    DOB: 12-06-1945, 68 y.o.   MRN: [id number]  Hypertension Pertinent negatives include no chest pain, headaches, palpitations or shortness of breath.   Patient seen with complaints of general malaise and some weight gain in recent months. Poor compliance with exercise. Good compliance with diet.  Type 2 diabetes. History of fair control. Most recent A1c 7.1%. Remains on metformin. No symptoms of polyuria or polydipsia. He's had steady weight gain of about 14 pounds over the past 6 months.  Recent elevated blood pressure. He felt poorly and went to the drugstore and had blood pressure 637 systolic. Denies any headaches, nausea, dizziness, or chest pains. No peripheral edema. No orthopnea. No excessive ETOH.  Past Medical History  Diagnosis Date  . DIABETES MELLITUS, TYPE II 08/08/2009  . HYPERLIPIDEMIA 08/08/2009  . URINARY INCONTINENCE 08/08/2009  . Palpitations   . Hiatal hernia    Past Surgical History  Procedure Laterality Date  . Appendectomy  1976  . Tonsillectomy  1961  . Achilles tendon repair  2002    rupture  . US echocardiography  02-10-2001    EF 60-65%  . Cardiovascular stress test  11-02-2003    EF 57%    reports that he quit smoking about 36 years ago. His smoking use included Cigarettes. He has a 20 pack-year smoking history. He has never used smokeless tobacco. He reports that he drinks alcohol. He reports that he does not use illicit drugs. family history includes Diabetes in his sister; Mental illness in his mother. Allergies  Allergen Reactions  . Actifed Cold-Sinus     rash  . Lisinopril Swelling  . Pseudoephedrine     REACTION: hives      Review of Systems  Constitutional: Positive for fatigue and unexpected weight change. Negative for appetite change.  Eyes: Negative for visual disturbance.  Respiratory: Negative for cough, chest tightness and shortness of breath.   Cardiovascular: Negative for chest  pain, palpitations and leg swelling.  Gastrointestinal: Negative for abdominal pain.  Endocrine: Negative for polydipsia and polyuria.  Neurological: Negative for dizziness, syncope, weakness, light-headedness and headaches.       Objective:   Physical Exam  Constitutional: He appears well-developed and well-nourished.  Neck: Neck supple. No thyromegaly present.  Cardiovascular: Normal rate and regular rhythm.   Pulmonary/Chest: Effort normal and breath sounds normal. No respiratory distress. He has no wheezes. He has no rales.  Musculoskeletal: He exhibits no edema.          Assessment & Plan:  #1 elevated blood pressure. Left arm seated after rest 150/90. We discussed options. We've recommended consideration for ACE inhibitor or angiotensin receptor blocker. He is reluctant. We've given option of some weight loss and reassess blood pressure 1-2 months-that we reviewed current guidelines would suggest going ahead and starting now with his diabetes status. #2 type 2 diabetes. Ordered future labs with hemoglobin A1c #3 dyslipidemia. Future labs ordered. #4 recent weight gain and fatigue. Check TSH with future labs.

## 2013-09-07 ENCOUNTER — Telehealth: Payer: Self-pay | Admitting: Cardiovascular Disease

## 2013-09-07 NOTE — Telephone Encounter (Signed)
New message           C/o high bp / it was 140something yesterday per pt

## 2013-09-07 NOTE — Telephone Encounter (Signed)
Pt requesting appt with Dr Acie Fredrickson to discuss BP. Pt given appt 09/09/13 with Dr Acie Fredrickson.

## 2013-09-08 ENCOUNTER — Other Ambulatory Visit (INDEPENDENT_AMBULATORY_CARE_PROVIDER_SITE_OTHER): Payer: Medicare Other

## 2013-09-08 DIAGNOSIS — R7989 Other specified abnormal findings of blood chemistry: Secondary | ICD-10-CM

## 2013-09-08 DIAGNOSIS — E119 Type 2 diabetes mellitus without complications: Secondary | ICD-10-CM

## 2013-09-08 DIAGNOSIS — R635 Abnormal weight gain: Secondary | ICD-10-CM | POA: Diagnosis not present

## 2013-09-08 DIAGNOSIS — I10 Essential (primary) hypertension: Secondary | ICD-10-CM

## 2013-09-08 LAB — LIPID PANEL
Cholesterol: 161 mg/dL (ref 0–200)
HDL: 31.8 mg/dL — ABNORMAL LOW (ref >39.00)
NonHDL: 129.2
Total CHOL/HDL Ratio: 5
Triglycerides: 259 mg/dL — ABNORMAL HIGH (ref 0.0–149.0)
VLDL: 51.8 mg/dL — ABNORMAL HIGH (ref 0.0–40.0)

## 2013-09-08 LAB — BASIC METABOLIC PANEL
BUN: 12 mg/dL (ref 6–23)
CO2: 28 mEq/L (ref 19–32)
Calcium: 9.4 mg/dL (ref 8.4–10.5)
Chloride: 102 mEq/L (ref 96–112)
Creatinine, Ser: 1.2 mg/dL (ref 0.4–1.5)
GFR: 81.58 mL/min (ref >60.00)
Glucose, Bld: 144 mg/dL — ABNORMAL HIGH (ref 70–99)
Potassium: 3.8 mEq/L (ref 3.5–5.1)
Sodium: 137 mEq/L (ref 135–145)

## 2013-09-08 LAB — LDL CHOLESTEROL, DIRECT: Direct LDL: 105.1 mg/dL

## 2013-09-08 LAB — MICROALBUMIN / CREATININE URINE RATIO
Creatinine,U: 157.8 mg/dL
Microalb Creat Ratio: 1.2 mg/g (ref 0.0–30.0)
Microalb, Ur: 1.9 mg/dL (ref 0.0–1.9)

## 2013-09-08 LAB — HEPATIC FUNCTION PANEL
ALT: 39 U/L (ref 0–53)
AST: 21 U/L (ref 0–37)
Albumin: 3.9 g/dL (ref 3.5–5.2)
Alkaline Phosphatase: 46 U/L (ref 39–117)
Bilirubin, Direct: 0.1 mg/dL (ref 0.0–0.3)
Total Bilirubin: 0.6 mg/dL (ref 0.2–1.2)
Total Protein: 7.3 g/dL (ref 6.0–8.3)

## 2013-09-08 LAB — HEMOGLOBIN A1C: Hgb A1c MFr Bld: 7.6 % — ABNORMAL HIGH (ref 4.6–6.5)

## 2013-09-08 LAB — TSH: TSH: 0.8 u[IU]/mL (ref 0.35–4.50)

## 2013-09-09 ENCOUNTER — Encounter: Payer: Self-pay | Admitting: Cardiovascular Disease

## 2013-09-09 ENCOUNTER — Ambulatory Visit (INDEPENDENT_AMBULATORY_CARE_PROVIDER_SITE_OTHER): Payer: Medicare Other | Admitting: Cardiovascular Disease

## 2013-09-09 ENCOUNTER — Encounter: Payer: Self-pay | Admitting: Family Medicine

## 2013-09-09 VITALS — BP 140/78 | HR 80 | Ht 74.0 in | Wt 231.6 lb

## 2013-09-09 DIAGNOSIS — I10 Essential (primary) hypertension: Secondary | ICD-10-CM

## 2013-09-09 DIAGNOSIS — E785 Hyperlipidemia, unspecified: Secondary | ICD-10-CM

## 2013-09-09 NOTE — Progress Notes (Signed)
Salik Laurel Run III Date of Birth  [birth year] [place]  [address]    [place], [place]  [address]    [place], [place]  [address] [phone]  Fax  [phone]  [phone]  Fax [phone]  Problem list: 1. Hypercholesterolemia 2. Palpitations 3. History of chest pain- normal stress Myoview study in October, 2005 4. Diabetes mellitus 5. Hypertension    History of Present Illness:  Grayden is a 68 year old gentleman with a history as noted above.   He is remaining quite active. He is fairly healthy. He's been exercising on a fairly regular basis.  He denies episodes of chest pain or shortness of breath.  Feb. 6, 2014: Tayjon is doing well.  He's not had any episodes of chest pain or shortness breath. He still exercising some but not as much as he would like to.  He gained a little bit of weight on a cruise this past fall and still working on getting that weight off.  April, 18, 2014:  He feels ok but has had an unusual sensation across his chest.   It is a similar sensation that he gets in his arms with the pravachol.  He has decreased his dose and feels better.  The sensation is not related to exercise, taking a deep breath, eating or drinking. It is also not related to twisting or turning of his torso..  the sensation is an off and on sensation. to last for a couple of hours.  Nov. 3, 2014:  Amadu is doing well.  Exercising sporadically .    Sept. 3, 2015:  Zadyn is doing ok.  Not exercising as much. 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    Has gained some weight - lack of exercise,  Has not changed his diet.    Current Outpatient Prescriptions on File Prior to Visit  Medication Sig Dispense Refill  . AMBULATORY NON FORMULARY MEDICATION Medication Name: Home Traction Unit  1 each  0  . aspirin 81 MG tablet Take 81 mg by mouth daily.        . Coenzyme Q10 (CO Q 10 PO)  Take 200 mg by mouth daily.      . fenofibrate 160 MG tablet TAKE 1 TABLET BY MOUTH EVERY DAY  90 tablet  3  . metFORMIN (GLUCOPHAGE) 1000 MG tablet TAKE 1 TABLET BY MOUTH DAILY WITH BREAKFAST  90 tablet  3  . NON FORMULARY Vemma(Diet Suppliment) QD      . polycarbophil (FIBERCON) 625 MG tablet Take 625 mg by mouth daily. Taking 2 Tablet      . pravastatin (PRAVACHOL) 40 MG tablet Take 1 tablet (40 mg total) by mouth daily.  90 tablet  3   No current facility-administered medications on file prior to visit.    Allergies  Allergen Reactions  . Actifed Cold-Sinus     rash  . Lisinopril Swelling  . Pseudoephedrine     REACTION: hives    Past Medical History  Diagnosis Date  . DIABETES MELLITUS, TYPE II 08/08/2009  . HYPERLIPIDEMIA 08/08/2009  . URINARY INCONTINENCE 08/08/2009  . Palpitations   . Hiatal hernia     Past Surgical History  Procedure Laterality Date  . Appendectomy  1976  . Tonsillectomy  1961  . Achilles tendon repair  2002    rupture  . US echocardiography  02-10-2001    EF 60-65%  . Cardiovascular stress test  11-02-2003    EF 57%    History  Smoking status  . Former Smoker -- 2.00 packs/day for 10 years  . Types: Cigarettes  . Quit date: 04/19/1977  Smokeless tobacco  . Never Used    History  Alcohol Use  . Yes    Comment: occasionally    Family History  Problem Relation Age of Onset  . Mental illness Mother   . Diabetes Sister     type ll    Reviw of Systems:  Reviewed in the HPI.  All other systems are negative.  Physical Exam: Blood pressure 140/78, pulse 80, height 6\' 2"  (1.88 m), weight 231 lb 9.6 oz (105.053 kg). General: Well developed, well nourished, in no acute distress.  Head: Normocephalic, atraumatic, sclera non-icteric, mucus membranes are moist,   Neck: Supple. Negative for carotid bruits. JVD not elevated.  Lungs: Clear bilaterally to auscultation without wheezes, rales, or rhonchi. Breathing is normal.  Heart: RRR with  S1 S2. No murmurs, rubs, or gallops  Abdomen: Soft, non-tender, non-distended with normal bowel sounds. No hepatomegaly. No rebound/guarding. No masses.  Msk:  Strength and tone appear normal for age.  Extremities: No clubbing or cyanosis. No edema.  Distal pedal pulses are 2+ and equal bilaterally.  Neuro: Alert and oriented X 3. Moves all extremities spontaneously.  Psych:  Responds to questions appropriately with a normal affect.  ECG: Sept. 3, 2015:  NSR At 80.  NS T abn.   Assessment / Plan:

## 2013-09-09 NOTE — Patient Instructions (Signed)
Your physician recommends that you continue on your current medications as directed. Please refer to the Current Medication list given to you today.  Your physician wants you to follow-up in: 1 year with Dr. [name].  You will receive a reminder letter in the mail two months in advance. If you don't receive a letter, please call our office to schedule the follow-up appointment.  

## 2013-09-09 NOTE — Assessment & Plan Note (Signed)
BP i a bit high.  He needs to resume his diet and exercise.

## 2013-09-09 NOTE — Assessment & Plan Note (Signed)
Todd [name] is doing well but his triglycerides have gone up. He has gained  weight. I suspect it is because he is has gained weight.  He will keep up with his BP readings

## 2013-09-09 NOTE — Addendum Note (Signed)
Addended by: [name] on: 09/09/2013 08:26 AM   Modules accepted: Orders

## 2013-09-21 ENCOUNTER — Encounter: Payer: Self-pay | Admitting: Family Medicine

## 2013-09-21 ENCOUNTER — Ambulatory Visit (INDEPENDENT_AMBULATORY_CARE_PROVIDER_SITE_OTHER): Payer: Medicare Other | Admitting: Family Medicine

## 2013-09-21 VITALS — BP 146/80 | HR 77 | Ht 74.0 in | Wt 231.0 lb

## 2013-09-21 DIAGNOSIS — M5010 Cervical disc disorder with radiculopathy, unspecified cervical region: Secondary | ICD-10-CM

## 2013-09-21 DIAGNOSIS — M5412 Radiculopathy, cervical region: Secondary | ICD-10-CM

## 2013-09-21 NOTE — Progress Notes (Signed)
  [facility] [place], [place] [address] Phone: [phone] Subjective:      CC: Bilateral shoulder pain and neck pain followup.  [id number] Todd [name] is a 68 y.o. male coming in with complaint of bilateral shoulder pain. The patient was previously seen and there is concern for cervical radiculopathy. 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 Patient continued to have pain so an MRI was ordered. 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  Patient has finished formal physical therapy at this time. Patient states he is having no pain at baseline and denies any pain with any activities of daily living. Patient hasn't started be more active and is contemplating joining a gym. States that she some mild soreness in his shoulders that seemed to respond well to 200 mg of ibuprofen twice a day. Denies any numbness in the arms or any weakness. Overall very happy with the results..    Past medical history, social, surgical and family history all reviewed in electronic medical record.   Review of Systems: No headache, visual changes, nausea, vomiting, diarrhea, constipation, dizziness, abdominal pain, skin rash, fevers, chills, night sweats, weight loss, swollen lymph nodes, body aches, joint swelling, muscle aches, chest pain, shortness of breath, mood changes.   Objective Blood pressure 146/80, pulse 77, height 6\' 2"  (1.88 m), weight 231 lb (104.781 kg), SpO2 98.00%.  General: No apparent distress alert and oriented x3 mood and affect normal, dressed appropriately.  HEENT: Pupils equal, extraocular movements intact  Respiratory: Patient's speak in full sentences and does not appear short of breath  Cardiovascular: No lower extremity edema, non tender, no  erythema  Skin: Warm dry intact with no signs of infection or rash on extremities or on axial skeleton.  Abdomen: Soft nontender  Neuro: Cranial nerves II through XII are intact, neurovascularly intact in all extremities with 2+ DTRs and 2+ pulses.  Lymph: No lymphadenopathy of posterior or anterior cervical chain or axillae bilaterally.  Gait normal with good balance and coordination.  MSK:  Non tender with full range of motion and good stability and symmetric strength and tone of  shoulders ,elbows, wrist, hip, knee and ankles bilaterally.  Neck: Inspection reveals mild increased lordosis of the cervical spine. No palpable stepoffs. Negative Spurling. Range of motion shows patient does have decreased extension lacking the last 5 as well as the last 3 of left rotation Grip strength and sensation normal in bilateral hands Strength good C4 to T1 distribution No sensory change to C4 to T1 Negative Hoffman sign bilaterally Reflexes normal No significant change from previous exam     Impression and Recommendations:     This case required medical decision making of moderate complexity.   ;

## 2013-09-21 NOTE — Patient Instructions (Signed)
Good to see you.  Ice is your friend Dennis Bast are doing great Lets decrease aspirin to 3 times a week if continue the ibuprofen.  When at the gym back exercises at least 2 times a week.  Consider also rowing machine for cardio.  See me about 4 weeks after starting a gym to make sure no pain.

## 2013-09-21 NOTE — Assessment & Plan Note (Signed)
Overall patient continues to do remarkably well. Encourage him to continue the ibuprofen if needed but to monitor the amount of aspirin he is taking on a regular basis then. Discussed the icing protocol. The patient's [facility] gym patient was given information about different activities and exercises that could be beneficial. I also discussed what exercises to avoid. Patient will start the gym exercises and come back again in 4-6 weeks later for further evaluation.  Spent greater than 25 minutes with patient face-to-face and had greater than 50% of counseling including as described above in assessment and plan.

## 2013-10-19 ENCOUNTER — Encounter: Payer: Self-pay | Admitting: Family Medicine

## 2013-10-19 ENCOUNTER — Ambulatory Visit (INDEPENDENT_AMBULATORY_CARE_PROVIDER_SITE_OTHER): Payer: Medicare Other | Admitting: Family Medicine

## 2013-10-19 VITALS — BP 146/80 | HR 81 | Ht 74.0 in | Wt 229.0 lb

## 2013-10-19 DIAGNOSIS — M5010 Cervical disc disorder with radiculopathy, unspecified cervical region: Secondary | ICD-10-CM

## 2013-10-19 NOTE — Progress Notes (Signed)
  [facility] [place], [place] [address] Phone: [phone] Subjective:    CC: Bilateral shoulder pain and neck pain followup.  [id number] Todd [name] is a 68 y.o. male coming in with complaint of bilateral shoulder pain. The patient was previously seen and there is concern for cervical radiculopathy. 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 Patient continued to have pain so an MRI was ordered. 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  Patient did do formal physical therapy and continues to do home exercises a regular basis. Patient has not joint and gym at this time. Patient is having no pain at baseline and has full range of motion. Patient has not started working out yet. Patient denies any nighttime awakening then has not taken any anti-inflammatories in multiple days. Patient is happy with the results.    Past medical history, social, surgical and family history all reviewed in electronic medical record.   Review of Systems: No headache, visual changes, nausea, vomiting, diarrhea, constipation, dizziness, abdominal pain, skin rash, fevers, chills, night sweats, weight loss, swollen lymph nodes, body aches, joint swelling, muscle aches, chest pain, shortness of breath, mood changes.   Objective Blood pressure 146/80, pulse 81, height 6\' 2"  (1.88 m), weight 229 lb (103.874 kg), SpO2 96.00%.  General: No apparent distress alert and oriented x3 mood and affect normal, dressed appropriately.  HEENT: Pupils equal, extraocular movements intact  Respiratory: Patient's speak in full sentences and does not appear short of breath  Cardiovascular: No lower extremity edema, non tender, no erythema  Skin: Warm dry intact with no signs of infection or rash  on extremities or on axial skeleton.  Abdomen: Soft nontender  Neuro: Cranial nerves II through XII are intact, neurovascularly intact in all extremities with 2+ DTRs and 2+ pulses.  Lymph: No lymphadenopathy of posterior or anterior cervical chain or axillae bilaterally.  Gait normal with good balance and coordination.  MSK:  Non tender with full range of motion and good stability and symmetric strength and tone of  shoulders ,elbows, wrist, hip, knee and ankles bilaterally.  Neck: Inspection reveals mild increased lordosis of the cervical spine. No palpable stepoffs. Negative Spurling. Full range of motion which is improved Grip strength and sensation normal in bilateral hands Strength good C4 to T1 distribution No sensory change to C4 to T1 Negative Hoffman sign bilaterally Reflexes normal No significant change from previous exam     Impression and Recommendations:     This case required medical decision making of moderate complexity.   ;

## 2013-10-19 NOTE — Assessment & Plan Note (Signed)
Patient overall is doing well. Patient at this time we'll continue with the home exercises and we discussed proper lifting position. Patient will avoid significant overhead activity. We discussed keeping neck in neutral position with lifting. Patient will return to the gym. Patient has any exacerbation of any of his underlying problems he'll come back again for further evaluation and treatment.

## 2013-10-19 NOTE — Patient Instructions (Signed)
Good to see you You are doing amazing and it is all you! Good luck with house.  See me when you need me.

## 2013-12-10 DIAGNOSIS — R972 Elevated prostate specific antigen [PSA]: Secondary | ICD-10-CM | POA: Diagnosis not present

## 2013-12-10 DIAGNOSIS — N320 Bladder-neck obstruction: Secondary | ICD-10-CM | POA: Diagnosis not present

## 2013-12-10 DIAGNOSIS — N529 Male erectile dysfunction, unspecified: Secondary | ICD-10-CM | POA: Diagnosis not present

## 2013-12-14 ENCOUNTER — Ambulatory Visit (INDEPENDENT_AMBULATORY_CARE_PROVIDER_SITE_OTHER): Payer: Medicare Other | Admitting: Family Medicine

## 2013-12-14 ENCOUNTER — Encounter: Payer: Self-pay | Admitting: Family Medicine

## 2013-12-14 ENCOUNTER — Other Ambulatory Visit: Payer: Self-pay | Admitting: Family Medicine

## 2013-12-14 ENCOUNTER — Other Ambulatory Visit: Payer: Self-pay

## 2013-12-14 VITALS — BP 142/80 | HR 62 | Temp 98.1°F | Wt 228.0 lb

## 2013-12-14 DIAGNOSIS — IMO0002 Reserved for concepts with insufficient information to code with codable children: Secondary | ICD-10-CM

## 2013-12-14 DIAGNOSIS — Z23 Encounter for immunization: Secondary | ICD-10-CM | POA: Diagnosis not present

## 2013-12-14 DIAGNOSIS — I10 Essential (primary) hypertension: Secondary | ICD-10-CM | POA: Diagnosis not present

## 2013-12-14 DIAGNOSIS — E1165 Type 2 diabetes mellitus with hyperglycemia: Secondary | ICD-10-CM

## 2013-12-14 DIAGNOSIS — E785 Hyperlipidemia, unspecified: Secondary | ICD-10-CM | POA: Diagnosis not present

## 2013-12-14 LAB — HEMOGLOBIN A1C: Hgb A1c MFr Bld: 7.8 % — ABNORMAL HIGH (ref 4.6–6.5)

## 2013-12-14 LAB — HM DIABETES FOOT EXAM: HM Diabetic Foot Exam: NORMAL

## 2013-12-14 MED ORDER — METFORMIN HCL 1000 MG PO TABS: 1000.0000 mg | ORAL_TABLET | Freq: Two times a day (BID) | ORAL | Status: DC

## 2013-12-14 NOTE — Progress Notes (Signed)
   Subjective:    Patient ID: Todd [name], male    DOB: 03-08-1945, 68 y.o.   MRN: [id number]  HPI Medical follow-up. Patient has history of type 2 diabetes, dyslipidemia, hypertension. His recent started back more consistent exercise. His weight is down 4 pounds last summer but still up considerably from where he has been the past. Poor compliance with exercise at times. Not monitoring blood sugars. Last A1c up to 7.6. No symptoms of polyuria or polydipsia. Compliant with medications otherwise. He takes metformin 1000 mg once daily. His blood pressures been somewhat elevated recently at several office visits including outside of here. He has been reluctant to add additional medications.  Past Medical History  Diagnosis Date  . DIABETES MELLITUS, TYPE II 08/08/2009  . HYPERLIPIDEMIA 08/08/2009  . URINARY INCONTINENCE 08/08/2009  . Palpitations   . Hiatal hernia    Past Surgical History  Procedure Laterality Date  . Appendectomy  1976  . Tonsillectomy  1961  . Achilles tendon repair  2002    rupture  . US echocardiography  02-10-2001    EF 60-65%  . Cardiovascular stress test  11-02-2003    EF 57%    reports that he quit smoking about 36 years ago. His smoking use included Cigarettes. He has a 20 pack-year smoking history. He has never used smokeless tobacco. He reports that he drinks alcohol. He reports that he does not use illicit drugs. family history includes Diabetes in his sister; Mental illness in his mother. Allergies  Allergen Reactions  . Actifed Cold-Sinus     rash  . Lisinopril Swelling  . Pseudoephedrine     REACTION: hives      Review of Systems  Constitutional: Negative for fatigue.  Eyes: Negative for visual disturbance.  Respiratory: Negative for cough, chest tightness and shortness of breath.   Cardiovascular: Negative for chest pain, palpitations and leg swelling.  Endocrine: Negative for polydipsia and polyuria.  Neurological: Negative for dizziness, syncope,  weakness, light-headedness and headaches.       Objective:   Physical Exam  Constitutional: He is oriented to person, place, and time. He appears well-developed and well-nourished.  HENT:  Right Ear: External ear normal.  Left Ear: External ear normal.  Mouth/Throat: Oropharynx is clear and moist.  Eyes: Pupils are equal, round, and reactive to light.  Neck: Neck supple. No thyromegaly present.  Cardiovascular: Normal rate and regular rhythm.   Pulmonary/Chest: Effort normal and breath sounds normal. No respiratory distress. He has no wheezes. He has no rales.  Musculoskeletal: He exhibits no edema.  Neurological: He is alert and oriented to person, place, and time.          Assessment & Plan:  #1 type 2 diabetes. History of suboptimal control recently. Recheck A1c today. If not improving consider increasing metformin 1000 g twice a day. Continue weight loss efforts #2 elevated blood pressure. Still above goal. We discussed previously recommendation to consider angiotensin receptor blocker or ACE inhibitor. He remains reluctant. Will reassess at follow-up 3 months and if blood pressure not closer 130/80 that time add additional medication as above Information given on DASH diet #3 dyslipidemia. Recent elevated triglycerides. Should improve with weight control and glycemic control. Consider repeat at follow-up #4 health maintenance. Prevnar 13 given. Already had flu vaccine.

## 2013-12-14 NOTE — Patient Instructions (Signed)
DASH Eating Plan °DASH stands for "Dietary Approaches to Stop Hypertension." The DASH eating plan is a healthy eating plan that has been shown to reduce high blood pressure (hypertension). Additional health benefits may include reducing the risk of type 2 diabetes mellitus, heart disease, and stroke. The DASH eating plan may also help with weight loss. °WHAT DO I NEED TO KNOW ABOUT THE DASH EATING PLAN? °For the DASH eating plan, you will follow these general guidelines: °· Choose foods with a percent daily value for sodium of less than 5% (as listed on the food label). °· Use salt-free seasonings or herbs instead of table salt or sea salt. °· Check with your health care provider or pharmacist before using salt substitutes. °· Eat lower-sodium products, often labeled as "lower sodium" or "no salt added." °· Eat fresh foods. °· Eat more vegetables, fruits, and low-fat dairy products. °· Choose whole grains. Look for the word "whole" as the first word in the ingredient list. °· Choose fish and skinless chicken or turkey more often than red meat. Limit fish, poultry, and meat to 6 oz (170 g) each day. °· Limit sweets, desserts, sugars, and sugary drinks. °· Choose heart-healthy fats. °· Limit cheese to 1 oz (28 g) per day. °· Eat more home-cooked food and less restaurant, buffet, and fast food. °· Limit fried foods. °· Cook foods using methods other than frying. °· Limit canned vegetables. If you do use them, rinse them well to decrease the sodium. °· When eating at a restaurant, ask that your food be prepared with less salt, or no salt if possible. °WHAT FOODS CAN I EAT? °Seek help from a dietitian for individual calorie needs. °Grains °Whole grain or whole wheat bread. Brown rice. Whole grain or whole wheat pasta. Quinoa, bulgur, and whole grain cereals. Low-sodium cereals. Corn or whole wheat flour tortillas. Whole grain cornbread. Whole grain crackers. Low-sodium crackers. °Vegetables °Fresh or frozen vegetables  (raw, steamed, roasted, or grilled). Low-sodium or reduced-sodium tomato and vegetable juices. Low-sodium or reduced-sodium tomato sauce and paste. Low-sodium or reduced-sodium canned vegetables.  °Fruits °All fresh, canned (in natural juice), or frozen fruits. °Meat and Other Protein Products °Ground beef (85% or leaner), grass-fed beef, or beef trimmed of fat. Skinless chicken or turkey. Ground chicken or turkey. Pork trimmed of fat. All fish and seafood. Eggs. Dried beans, peas, or lentils. Unsalted nuts and seeds. Unsalted canned beans. °Dairy °Low-fat dairy products, such as skim or 1% milk, 2% or reduced-fat cheeses, low-fat ricotta or cottage cheese, or plain low-fat yogurt. Low-sodium or reduced-sodium cheeses. °Fats and Oils °Tub margarines without trans fats. Light or reduced-fat mayonnaise and salad dressings (reduced sodium). Avocado. Safflower, olive, or canola oils. Natural peanut or almond butter. °Other °Unsalted popcorn and pretzels. °The items listed above may not be a complete list of recommended foods or beverages. Contact your dietitian for more options. °WHAT FOODS ARE NOT RECOMMENDED? °Grains °White bread. White pasta. White rice. Refined cornbread. Bagels and croissants. Crackers that contain trans fat. °Vegetables °Creamed or fried vegetables. Vegetables in a cheese sauce. Regular canned vegetables. Regular canned tomato sauce and paste. Regular tomato and vegetable juices. °Fruits °Dried fruits. Canned fruit in light or heavy syrup. Fruit juice. °Meat and Other Protein Products °Fatty cuts of meat. Ribs, chicken wings, bacon, sausage, bologna, salami, chitterlings, fatback, hot dogs, bratwurst, and packaged luncheon meats. Salted nuts and seeds. Canned beans with salt. °Dairy °Whole or 2% milk, cream, half-and-half, and cream cheese. Whole-fat or sweetened yogurt. Full-fat   cheeses or blue cheese. Nondairy creamers and whipped toppings. Processed cheese, cheese spreads, or cheese  curds. °Condiments °Onion and garlic salt, seasoned salt, table salt, and sea salt. Canned and packaged gravies. Worcestershire sauce. Tartar sauce. Barbecue sauce. Teriyaki sauce. Soy sauce, including reduced sodium. Steak sauce. Fish sauce. Oyster sauce. Cocktail sauce. Horseradish. Ketchup and mustard. Meat flavorings and tenderizers. Bouillon cubes. Hot sauce. Tabasco sauce. Marinades. Taco seasonings. Relishes. °Fats and Oils °Butter, stick margarine, lard, shortening, ghee, and bacon fat. Coconut, palm kernel, or palm oils. Regular salad dressings. °Other °Pickles and olives. Salted popcorn and pretzels. °The items listed above may not be a complete list of foods and beverages to avoid. Contact your dietitian for more information. °WHERE CAN I FIND MORE INFORMATION? °National Heart, [employer], and [employer] Institute: www.nhlbi.nih.gov/health/health-topics/topics/dash/ °Document Released: 12/13/2010 Document Revised: 05/10/2013 Document Reviewed: 10/28/2012 °Exit[employer]® Patient Information ©2015 [employer]. This information is not intended to replace advice given to you by your health care provider. Make sure you discuss any questions you have with your health care provider. ° °

## 2013-12-14 NOTE — Progress Notes (Signed)
Pre visit review using our clinic review tool, if applicable. No additional management support is needed unless otherwise documented below in the visit note. 

## 2013-12-15 ENCOUNTER — Telehealth: Payer: Self-pay | Admitting: Family Medicine

## 2013-12-15 NOTE — Telephone Encounter (Signed)
emmi emailed °

## 2013-12-20 ENCOUNTER — Encounter: Payer: Self-pay | Admitting: Family Medicine

## 2013-12-20 ENCOUNTER — Other Ambulatory Visit: Payer: Self-pay

## 2013-12-20 MED ORDER — METFORMIN HCL 1000 MG PO TABS: 1000.0000 mg | ORAL_TABLET | Freq: Two times a day (BID) | ORAL | Status: DC

## 2013-12-21 ENCOUNTER — Other Ambulatory Visit: Payer: Self-pay | Admitting: Family Medicine

## 2013-12-21 LAB — HM DIABETES FOOT EXAM: HM Diabetic Foot Exam: NORMAL

## 2014-02-28 ENCOUNTER — Other Ambulatory Visit: Payer: Self-pay | Admitting: Family Medicine

## 2014-03-15 DIAGNOSIS — H2513 Age-related nuclear cataract, bilateral: Secondary | ICD-10-CM | POA: Diagnosis not present

## 2014-03-15 DIAGNOSIS — E119 Type 2 diabetes mellitus without complications: Secondary | ICD-10-CM | POA: Diagnosis not present

## 2014-03-15 LAB — HM DIABETES EYE EXAM

## 2014-03-16 LAB — HM DIABETES EYE EXAM

## 2014-03-21 ENCOUNTER — Other Ambulatory Visit: Payer: Self-pay | Admitting: Family Medicine

## 2014-03-22 ENCOUNTER — Encounter: Payer: Self-pay | Admitting: Family Medicine

## 2014-03-22 ENCOUNTER — Ambulatory Visit (INDEPENDENT_AMBULATORY_CARE_PROVIDER_SITE_OTHER): Payer: Medicare Other | Admitting: Family Medicine

## 2014-03-22 VITALS — BP 138/80 | HR 70 | Temp 98.1°F | Wt 223.0 lb

## 2014-03-22 DIAGNOSIS — E1165 Type 2 diabetes mellitus with hyperglycemia: Secondary | ICD-10-CM | POA: Diagnosis not present

## 2014-03-22 DIAGNOSIS — I10 Essential (primary) hypertension: Secondary | ICD-10-CM

## 2014-03-22 DIAGNOSIS — E785 Hyperlipidemia, unspecified: Secondary | ICD-10-CM | POA: Diagnosis not present

## 2014-03-22 DIAGNOSIS — IMO0002 Reserved for concepts with insufficient information to code with codable children: Secondary | ICD-10-CM

## 2014-03-22 LAB — HEMOGLOBIN A1C: Hgb A1c MFr Bld: 7.3 % — ABNORMAL HIGH (ref 4.6–6.5)

## 2014-03-22 NOTE — Progress Notes (Signed)
   Subjective:    Patient ID: Todd [name], male    DOB: [birth year], 69 y.o.   MRN: [id number]  HPI Patient seen for follow-up type 2 diabetes. A1c has been increasing with most recent 7.8% back in December. We increased metformin 1000 mg twice daily. He has lost 5 pounds since then due to his efforts. He plans to start back more consistent exercise soon. No symptoms of polyuria or polydipsia. Eye exam yesterday reportedly normal with the exception of mild cataracts.  History of borderline elevated blood pressure. Not monitoring at home. No headaches. No chest pains. No dizziness. He has been reluctant to take blood pressure medications. Recent urine microalbumin screen negative. History of dyslipidemia. Elevated triglycerides and low HDL. LDL at goal. Remains on pravastatin.  History of elevated PSA. He went to urologist at follow-up which was back down. No obstructive urinary symptoms at this time.  Past Medical History  Diagnosis Date  . DIABETES MELLITUS, TYPE II 08/08/2009  . HYPERLIPIDEMIA 08/08/2009  . URINARY INCONTINENCE 08/08/2009  . Palpitations   . Hiatal hernia    Past Surgical History  Procedure Laterality Date  . Appendectomy  1976  . Tonsillectomy  1961  . Achilles tendon repair  2002    rupture  . US echocardiography  02-10-2001    EF 60-65%  . Cardiovascular stress test  11-02-2003    EF 57%    reports that he quit smoking about 36 years ago. His smoking use included Cigarettes. He has a 20 pack-year smoking history. He has never used smokeless tobacco. He reports that he drinks alcohol. He reports that he does not use illicit drugs. family history includes Diabetes in his sister; Mental illness in his mother. Allergies  Allergen Reactions  . Actifed Cold-Sinus     rash  . Lisinopril Swelling  . Pseudoephedrine     REACTION: hives      Review of Systems  Constitutional: Negative for fatigue.  Eyes: Negative for visual disturbance.  Respiratory: Negative for  cough, chest tightness and shortness of breath.   Cardiovascular: Negative for chest pain, palpitations and leg swelling.  Endocrine: Negative for polydipsia and polyuria.  Neurological: Negative for dizziness, syncope, weakness, light-headedness and headaches.       Objective:   Physical Exam  Constitutional: He is oriented to person, place, and time. He appears well-developed and well-nourished.  HENT:  Right Ear: External ear normal.  Left Ear: External ear normal.  Mouth/Throat: Oropharynx is clear and moist.  Eyes: Pupils are equal, round, and reactive to light.  Neck: Neck supple. No thyromegaly present.  Cardiovascular: Normal rate and regular rhythm.   Pulmonary/Chest: Effort normal and breath sounds normal. No respiratory distress. He has no wheezes. He has no rales.  Musculoskeletal: He exhibits no edema.  Neurological: He is alert and oriented to person, place, and time.          Assessment & Plan:  #1 type 2 diabetes. History of recent suboptimal control. Hopefully improved with weight loss. Increase consistency of exercise. Recheck A1c today #2 elevated blood pressure-borderline. Slightly improved today compared to last visit. Patient prefers increasing his exercise and weight control and reassess at follow-up. #3 history of dyslipidemia. Mildly elevated triglycerides and low HDL. LDL recently at goal. Continue pravastatin.

## 2014-03-22 NOTE — Progress Notes (Signed)
Pre visit review using our clinic review tool, if applicable. No additional management support is needed unless otherwise documented below in the visit note. 

## 2014-04-05 DIAGNOSIS — H16221 Keratoconjunctivitis sicca, not specified as Sjogren's, right eye: Secondary | ICD-10-CM | POA: Diagnosis not present

## 2014-04-05 DIAGNOSIS — H53141 Visual discomfort, right eye: Secondary | ICD-10-CM | POA: Diagnosis not present

## 2014-04-06 ENCOUNTER — Telehealth: Payer: Self-pay | Admitting: Family Medicine

## 2014-04-06 MED ORDER — TRAMADOL HCL 50 MG PO TABS: 50.0000 mg | ORAL_TABLET | Freq: Every evening | ORAL | Status: DC | PRN

## 2014-04-06 NOTE — Telephone Encounter (Signed)
rx sent into pharmacy. Pt will need an OV for further refills.

## 2014-04-06 NOTE — Telephone Encounter (Signed)
Patient needs a prescription filled for traMADol (ULTRAM) 50 MG tablet [[id number]] DISCONTINUED and sent to [facility]. The prescription was written back in June and he said it was never filled. I told him I'm not sure you would be able to fill it since it was so long ago. He wanted the prescription sent over ASAP.

## 2014-04-11 ENCOUNTER — Encounter: Payer: Self-pay | Admitting: Family Medicine

## 2014-05-03 NOTE — Progress Notes (Signed)
Cardiology Office Note   Date:  05/04/2014   ID:  [name], DOB 1945-08-04, MRN [id number]  PCP:  [name], MD  Cardiologist:   Thayer Headings, MD   Chief Complaint  Patient presents with  . Hypertension  . Hyperlipidemia   1. Hypercholesterolemia 2. Palpitations [name]. History of chest pain- normal stress Myoview study in October, 2005 4. Diabetes mellitus 5. Hypertension [name]. Atypical chest pain:     History of Present Illness:  Todd [name] is a 69 year old gentleman with a history as noted above. He is remaining quite active. He is fairly healthy. He's been exercising on a fairly regular basis.  He denies episodes of chest pain or shortness of breath.  Todd [name], Todd [name] is doing well. He's not had any episodes of chest pain or shortness breath. He still exercising some but not as much as he would like to. He gained a little bit of weight on a cruise this past fall and still working on getting that weight off.  April, 18, 2014:  He feels ok but has had an unusual sensation across his chest. It is a similar sensation that he gets in his arms with the pravachol. He has decreased his dose and feels better. The sensation is not related to exercise, taking a deep breath, eating or drinking. It is also not related to twisting or turning of his torso.. the sensation is an off and on sensation. to last for a couple of hours.  Nov. [name], 2014:  Todd [name] is doing well. Exercising sporadically .   Todd [name], 2015:  [name] is doing ok. Not exercising as much. 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 Has gained some weight - lack of exercise, Has not changed his diet.     May 04, 2014:   Todd [name] is a 69 y.o. male who presents for follow up of his HTN and hyperlipidemia.   He's been having some episodes of CP recently.  Worse with deep breath. More fatigued for the past couple of weeks  .  Sleeping well.  Wants to get  back into an exercise regimin.     Past Medical History  Diagnosis Date  . DIABETES MELLITUS, TYPE II 08/08/2009  . HYPERLIPIDEMIA 08/08/2009  . URINARY INCONTINENCE 08/08/2009  . Palpitations   . Hiatal hernia     Past Surgical History  Procedure Laterality Date  . Appendectomy  1976  . Tonsillectomy  1961  . Achilles tendon repair  2002    rupture  . US echocardiography  02-10-2001    EF 60-65%  . Cardiovascular stress test  11-02-2003    EF 57%     Current Outpatient Prescriptions  Medication Sig Dispense Refill  . alfuzosin (UROXATRAL) 10 MG 24 hr tablet Take 10 mg by mouth daily with breakfast.    . AMBULATORY NON FORMULARY MEDICATION Medication Name: Home Traction Unit 1 each 0  . aspirin 81 MG tablet Take 81 mg by mouth daily.      . Coenzyme Q10 (CO Q 10 PO) Take 200 mg by mouth daily.    . fenofibrate 160 MG tablet Take 1 tablet by mouth  every day 90 tablet 0  . metFORMIN (GLUCOPHAGE) 1000 MG tablet Take 1 tablet (1,000 mg total) by mouth 2 (two) times daily with a meal. 180 tablet 2  . NON FORMULARY Vemma(Diet Suppliment) QD    . polycarbophil (FIBERCON) 625 MG  tablet Take 625 mg by mouth daily. Taking 2 Tablet    . pravastatin (PRAVACHOL) 40 MG tablet Take 1 tablet by mouth  daily 90 tablet 2  . traMADol (ULTRAM) 50 MG tablet Take 1 tablet (50 mg total) by mouth at bedtime as needed. 30 tablet 0   No current facility-administered medications for this visit.    Allergies:   Actifed cold-sinus; Lisinopril; and Pseudoephedrine    Social History:  The patient  reports that he quit smoking about 37 years ago. His smoking use included Cigarettes. He has a 20 pack-year smoking history. He has never used smokeless tobacco. He reports that he drinks alcohol. He reports that he does not use illicit drugs.   Family History:  The patient's family history includes Diabetes in his sister; Mental illness in his mother.    ROS:  Please see the history of present illness.     Review of Systems: Constitutional:  denies fever, chills, diaphoresis, appetite change and fatigue.  HEENT: denies photophobia, eye pain, redness, hearing loss, ear pain, congestion, sore throat, rhinorrhea, sneezing, neck pain, neck stiffness and tinnitus.  Respiratory: denies SOB, DOE, cough, chest tightness, and wheezing.  Cardiovascular: denies chest pain, palpitations and leg swelling.  Gastrointestinal: denies nausea, vomiting, abdominal pain, diarrhea, constipation, blood in stool.  Genitourinary: denies dysuria, urgency, frequency, hematuria, flank pain and difficulty urinating.  Musculoskeletal: denies  myalgias, back pain, joint swelling, arthralgias and gait problem.   Skin: denies pallor, rash and wound.  Neurological: denies dizziness, seizures, syncope, weakness, light-headedness, numbness and headaches.   Hematological: denies adenopathy, easy bruising, personal or family bleeding history.  Psychiatric/ Behavioral: denies suicidal ideation, mood changes, confusion, nervousness, sleep disturbance and agitation.       All other systems are reviewed and negative.    PHYSICAL EXAM: VS:  BP 120/74 mmHg  Pulse 62  Ht [name]\' 2"  (1.88 m)  Wt 221 lb 4.8 oz (100.381 kg)  BMI 28.40 kg/m2 , BMI Body mass index is 28.4 kg/(m^2). GEN: Well nourished, well developed, in no acute distress HEENT: normal Neck: no JVD, carotid bruits, or masses Cardiac: RRR; no murmurs, rubs, or gallops,no edema , no chest wall tenderness. Respiratory:  clear to auscultation bilaterally, normal work of breathing GI: soft, nontender, nondistended, + BS MS: no deformity or atrophy Skin: warm and dry, no rash Neuro:  Strength and sensation are intact Psych: normal   EKG:  EKG is ordered today. The ekg ordered today demonstrates NSR at 62, Inc. LBBB   Recent Labs: 09/08/2013: ALT 39; BUN 12; Creatinine 1.2; Potassium [name].8; Sodium 137; TSH 0.80    Lipid Panel    Component Value Date/Time   CHOL  161 09/08/2013 0841   TRIG 259.0* 09/08/2013 0841   HDL 31.80* 09/08/2013 0841   CHOLHDL 5 09/08/2013 0841   VLDL 51.8* 09/08/2013 0841   LDLCALC 89 12/07/2012 0903   LDLDIRECT 105.1 09/08/2013 0841      Wt Readings from Last [name] Encounters:  05/04/14 221 lb 4.8 oz (100.381 kg)  03/22/14 223 lb (101.152 kg)  12/14/13 228 lb (103.42 kg)      Other studies Reviewed: Additional studies/ records that were reviewed today include: . Review of the above records demonstrates:    ASSESSMENT AND PLAN:  1. Hypercholesterolemia- his lipids have been fairly well-controlled. His triglyceride level is moderately elevated and he is on fenofibrate. Her symptoms exercise on a regular basis. I've encouraged him to decrease intake of his carbohydrates and fats. We'll  check his levels again in [name] month.  2. Palpitations [name]. History of chest pain- normal stress Myoview study in October, 2005 - his chest pains are somewhat atypical. They seem to be related to deep breaths. I've encouraged him to exercise regularly and he will call me if he has any difficulties or further chest pains related to exercise.  4. Diabetes mellitus  5. Hypertension- blood pressure is  well-controlled.  Current medicines are reviewed at length with the patient today.  The patient does not have concerns regarding medicines.  The following changes have been made:  no change  Labs/ tests ordered today include:   Orders Placed This Encounter  Procedures  . EKG 12-Lead     Disposition:   FU with [name] months     Signed, Skylar Priest, [name], MD  05/04/2014 8:28 AM    [facility] [address], [place], [place]  [address] Phone: [phone]; Fax: [phone]

## 2014-05-04 ENCOUNTER — Encounter: Payer: Self-pay | Admitting: Cardiovascular Disease

## 2014-05-04 ENCOUNTER — Ambulatory Visit (INDEPENDENT_AMBULATORY_CARE_PROVIDER_SITE_OTHER): Payer: Medicare Other | Admitting: Cardiovascular Disease

## 2014-05-04 VITALS — BP 120/74 | HR 62 | Ht 74.0 in | Wt 221.3 lb

## 2014-05-04 DIAGNOSIS — E785 Hyperlipidemia, unspecified: Secondary | ICD-10-CM | POA: Diagnosis not present

## 2014-05-04 DIAGNOSIS — R0789 Other chest pain: Secondary | ICD-10-CM | POA: Diagnosis not present

## 2014-05-04 DIAGNOSIS — I10 Essential (primary) hypertension: Secondary | ICD-10-CM | POA: Diagnosis not present

## 2014-05-04 NOTE — Patient Instructions (Addendum)
Medication Instructions:  Your physician recommends that you continue on your current medications as directed. Please refer to the Current Medication list given to you today.   Labwork: Your physician recommends that you return for lab work (basic metabolic panel, cholesterol, liver) in: 6 months on the day of or a few days before your office visit with Dr. Acie Fredrickson.  You will need to FAST for this appointment - nothing to eat or drink after midnight the night before except water.   Testing/Procedures: None  Follow-Up: Your physician wants you to follow-up in: 6 months with Dr. Acie Fredrickson.  You will receive a reminder letter in the mail two months in advance. If you don't receive a letter, please call our office to schedule the follow-up appointment.

## 2014-05-29 ENCOUNTER — Other Ambulatory Visit: Payer: Self-pay | Admitting: Family Medicine

## 2014-06-03 ENCOUNTER — Other Ambulatory Visit: Payer: Self-pay | Admitting: *Deleted

## 2014-06-03 MED ORDER — TRAMADOL HCL 50 MG PO TABS: 50.0000 mg | ORAL_TABLET | Freq: Every evening | ORAL | Status: DC | PRN

## 2014-06-03 NOTE — Telephone Encounter (Signed)
Refill done.  Pt needs appt for future refills.

## 2014-07-21 ENCOUNTER — Other Ambulatory Visit: Payer: Self-pay | Admitting: Family Medicine

## 2014-07-21 NOTE — Telephone Encounter (Signed)
Refill done.  

## 2014-07-25 ENCOUNTER — Encounter: Payer: Self-pay | Admitting: Family Medicine

## 2014-07-25 ENCOUNTER — Ambulatory Visit (INDEPENDENT_AMBULATORY_CARE_PROVIDER_SITE_OTHER): Payer: Medicare Other | Admitting: Family Medicine

## 2014-07-25 VITALS — BP 122/82 | HR 87 | Wt 224.0 lb

## 2014-07-25 DIAGNOSIS — M5010 Cervical disc disorder with radiculopathy, unspecified cervical region: Secondary | ICD-10-CM

## 2014-07-25 MED ORDER — AMOXICILLIN-POT CLAVULANATE 875-125 MG PO TABS: 1.0000 | ORAL_TABLET | Freq: Two times a day (BID) | ORAL | Status: DC

## 2014-07-25 MED ORDER — GABAPENTIN 100 MG PO CAPS: 200.0000 mg | ORAL_CAPSULE | Freq: Every day | ORAL | Status: DC

## 2014-07-25 NOTE — Assessment & Plan Note (Addendum)
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 Patient has been doing relatively well and we will make some small changes. Patient was started on gabapentin. We discussed icing regimen and home exercises. Patient given some topical anti-inflammatory suture try and we discussed over-the-counter natural supple mentation's as well. Patient come back and see me again in 3 weeks

## 2014-07-25 NOTE — Patient Instructions (Addendum)
Good to see you Ice can help Keep up with the exercises and stay active On wall with head, shoulder heels and butt touching for a goal of 5 minutes daily Would continue the vitamins Gabapentin 100mg  at night for first week then 200mg  nightly thereafter If worsening symptoms give me a call.  See me again in 3-4 weeks.

## 2014-07-25 NOTE — Progress Notes (Signed)
  [facility] [address], [place] [address] Phone: [phone] Subjective:    CC: Bilateral shoulder pain and neck pain followup.  [id number] Todd [name] is a 69 y.o. male coming in with complaint of bilateral shoulder pain. The patient was previously seen and there is concern for cervical radiculopathy. 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 Patient continued to have pain so an MRI was ordered. 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  Patient did do formal physical therapy and continues to do home exercises a regular basis. Time patient was seen was back in October. Patient states overall he had been doing relatively well but is having Morton's tightness in his neck. Nothing that stopping him from activities but not doing significant a better. Patient states that he is having some mild side effects to the tramadol and is wondering what else he can do for breakthrough pain. Patient has been doing some of the vitamins over-the-counter but not regularly. Patient has been working out on a regular basis.  .    Past medical history, social, surgical and family history all reviewed in electronic medical record.   Review of Systems: No headache, visual changes, nausea, vomiting, diarrhea, constipation, dizziness, abdominal pain, skin rash, fevers, chills, night sweats, weight loss, swollen lymph nodes, body aches, joint swelling, muscle aches, chest pain, shortness of breath, mood changes.   Objective Blood pressure 122/82, pulse 87, weight 224 lb (101.606 kg), SpO2 96 %.  General: No apparent distress alert and oriented x3 mood and affect normal, dressed appropriately.  HEENT: Pupils equal, extraocular movements intact  Respiratory: Patient's speak in  full sentences and does not appear short of breath  Cardiovascular: No lower extremity edema, non tender, no erythema  Skin: Warm dry intact with no signs of infection or rash on extremities or on axial skeleton.  Abdomen: Soft nontender  Neuro: Cranial nerves II through XII are intact, neurovascularly intact in all extremities with 2+ DTRs and 2+ pulses.  Lymph: No lymphadenopathy of posterior or anterior cervical chain or axillae bilaterally.  Gait normal with good balance and coordination.  MSK:  Non tender with full range of motion and good stability and symmetric strength and tone of  shoulders ,elbows, wrist, hip, knee and ankles bilaterally.  Neck: Inspection reveals mild increased lordosis of the cervical spine. No palpable stepoffs. Negative Spurling radiculopathy but is having pain. Limited range of motion with left-sided side bending is left-sided rotation Grip strength and sensation normal in bilateral hands Strength good C4 to T1 distribution No sensory change to C4 to T1 Negative Hoffman sign bilaterally Reflexes normal Mild decrease in range of motion compared to previous exam     Impression and Recommendations:     This case required medical decision making of moderate complexity.   ;

## 2014-08-19 ENCOUNTER — Ambulatory Visit: Payer: PRIVATE HEALTH INSURANCE | Admitting: Family Medicine

## 2014-08-23 ENCOUNTER — Ambulatory Visit (INDEPENDENT_AMBULATORY_CARE_PROVIDER_SITE_OTHER): Payer: Medicare Other | Admitting: Family Medicine

## 2014-08-23 ENCOUNTER — Encounter: Payer: Self-pay | Admitting: Family Medicine

## 2014-08-23 VITALS — BP 120/76 | HR 74 | Ht 74.0 in | Wt 227.0 lb

## 2014-08-23 DIAGNOSIS — M5010 Cervical disc disorder with radiculopathy, unspecified cervical region: Secondary | ICD-10-CM | POA: Diagnosis not present

## 2014-08-23 NOTE — Progress Notes (Signed)
Pre visit review using our clinic review tool, if applicable. No additional management support is needed unless otherwise documented below in the visit note. 

## 2014-08-23 NOTE — Assessment & Plan Note (Signed)
Patient does have osteophytic changes mostly of the C5 region that likely contributing to some of this. Patient does have significant foraminal stenosis at C5. Patient is doing very well with conservative therapy. Discussed with patient about any worsening symptoms to start the gabapentin on a more regular basis. Patient will continue the icing and the postural changes. If patient does well he can seem an as-needed basis.

## 2014-08-23 NOTE — Patient Instructions (Signed)
Verbal instructions given

## 2014-08-23 NOTE — Progress Notes (Signed)
  [facility] [address], [place] [address] Phone: [phone] Subjective:    CC: Bilateral shoulder pain and neck pain followup.  [id number] Todd [name] is a 69 y.o. male coming in with complaint of bilateral shoulder pain. The patient was previously seen and there is concern for cervical radiculopathy. 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 Patient continued to have pain so an MRI was ordered. 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  Patient did finish up with formal physical therapy and was doing very well with conservative therapy. Patient was last seen 10 months ago before his last visit one month ago. Patient was started on a low dose of gabapentin at night we discussed doing the exercises a more regular basis as well as natural supplementations. Patient states he is not taking the gabapentin regularly. Patient continues on the vitamins and continues to work diligently of doing the exercises multiple times a day. Patient states that he is doing very well. Still some mild radicular symptoms but no weakness. Nothing that is waking him up at night or stopping him from activities. Patient is happy with the results.  .    Past medical history, social, surgical and family history all reviewed in electronic medical record.   Review of Systems: No headache, visual changes, nausea, vomiting, diarrhea, constipation, dizziness, abdominal pain, skin rash, fevers, chills, night sweats, weight loss, swollen lymph nodes, body aches, joint swelling, muscle aches, chest pain, shortness of breath, mood changes.   Objective Blood pressure 120/76, pulse 74, height 6\' 2"  (1.88 m), weight 227 lb (102.967 kg), SpO2 96 %.  General: No apparent distress alert and  oriented x3 mood and affect normal, dressed appropriately.  HEENT: Pupils equal, extraocular movements intact  Respiratory: Patient's speak in full sentences and does not appear short of breath  Cardiovascular: No lower extremity edema, non tender, no erythema  Skin: Warm dry intact with no signs of infection or rash on extremities or on axial skeleton.  Abdomen: Soft nontender  Neuro: Cranial nerves II through XII are intact, neurovascularly intact in all extremities with 2+ DTRs and 2+ pulses.  Lymph: No lymphadenopathy of posterior or anterior cervical chain or axillae bilaterally.  Gait normal with good balance and coordination.  MSK:  Non tender with full range of motion and good stability and symmetric strength and tone of  shoulders ,elbows, wrist, hip, knee and ankles bilaterally.  Neck: Inspection reveals mild increased lordosis of the cervical spine. No palpable stepoffs. Negative Spurling radiculopathy today Increased range of motion from previous exam to near full range of motion Grip strength and sensation normal in bilateral hands Strength good C4 to T1 distribution No sensory change to C4 to T1 Negative Hoffman sign bilaterally Reflexes normal Improvement from previous exam.     Impression and Recommendations:     This case required medical decision making of moderate complexity.   ;

## 2014-09-07 DIAGNOSIS — R350 Frequency of micturition: Secondary | ICD-10-CM | POA: Diagnosis not present

## 2014-09-07 DIAGNOSIS — N342 Other urethritis: Secondary | ICD-10-CM | POA: Diagnosis not present

## 2014-09-19 ENCOUNTER — Ambulatory Visit (INDEPENDENT_AMBULATORY_CARE_PROVIDER_SITE_OTHER): Payer: Medicare Other | Admitting: Family Medicine

## 2014-09-19 ENCOUNTER — Encounter: Payer: Self-pay | Admitting: Family Medicine

## 2014-09-19 VITALS — BP 130/80 | HR 72 | Temp 98.1°F | Wt 226.0 lb

## 2014-09-19 DIAGNOSIS — E785 Hyperlipidemia, unspecified: Secondary | ICD-10-CM | POA: Diagnosis not present

## 2014-09-19 DIAGNOSIS — I10 Essential (primary) hypertension: Secondary | ICD-10-CM | POA: Diagnosis not present

## 2014-09-19 DIAGNOSIS — Z23 Encounter for immunization: Secondary | ICD-10-CM | POA: Diagnosis not present

## 2014-09-19 DIAGNOSIS — E1165 Type 2 diabetes mellitus with hyperglycemia: Secondary | ICD-10-CM

## 2014-09-19 DIAGNOSIS — IMO0002 Reserved for concepts with insufficient information to code with codable children: Secondary | ICD-10-CM

## 2014-09-19 LAB — LIPID PANEL
Cholesterol: 158 mg/dL (ref 0–200)
HDL: 39.3 mg/dL (ref >39.00)
NonHDL: 118.45
Total CHOL/HDL Ratio: 4
Triglycerides: 217 mg/dL — ABNORMAL HIGH (ref 0.0–149.0)
VLDL: 43.4 mg/dL — ABNORMAL HIGH (ref 0.0–40.0)

## 2014-09-19 LAB — HEPATIC FUNCTION PANEL
ALT: 28 U/L (ref 0–53)
AST: 14 U/L (ref 0–37)
Albumin: 4.3 g/dL (ref 3.5–5.2)
Alkaline Phosphatase: 47 U/L (ref 39–117)
Bilirubin, Direct: 0.1 mg/dL (ref 0.0–0.3)
Total Bilirubin: 0.5 mg/dL (ref 0.2–1.2)
Total Protein: 7.7 g/dL (ref 6.0–8.3)

## 2014-09-19 LAB — MICROALBUMIN / CREATININE URINE RATIO
Creatinine,U: 120.9 mg/dL
Microalb Creat Ratio: 0.6 mg/g (ref 0.0–30.0)
Microalb, Ur: <0.7 mg/dL (ref 0.0–1.9)

## 2014-09-19 LAB — LDL CHOLESTEROL, DIRECT: Direct LDL: 94 mg/dL

## 2014-09-19 LAB — HEMOGLOBIN A1C: Hgb A1c MFr Bld: 7.3 % — ABNORMAL HIGH (ref 4.6–6.5)

## 2014-09-19 LAB — HM DIABETES FOOT EXAM: HM Diabetic Foot Exam: NORMAL

## 2014-09-19 NOTE — Progress Notes (Signed)
   Subjective:    Patient ID: Todd [name], male    DOB: 09/27/1945, 69 y.o.   MRN: [id number]  HPI  medical follow-up   Type 2 diabetes. Last A1c 7.3%. Not monitoring sugars regularly. No symptoms recently of polyuria or polydipsia. Remains on metformin. Exercises regularly. I exam last March. No retinopathy history and no neuropathy symptoms.   Hypertension in the past but currently controlled off medications. He follows low-sodium diet. Hyperlipidemia treated with pravastatin and fenofibrate. No dizziness or chest pains. No headaches. Compliant with all medications. Denies any side effects.   History of urinary urgency treated with Uroxatral. Symptomatically stable. Needs flu vaccine.  Past Medical History  Diagnosis Date  . DIABETES MELLITUS, TYPE II 08/08/2009  . HYPERLIPIDEMIA 08/08/2009  . URINARY INCONTINENCE 08/08/2009  . Palpitations   . Hiatal hernia    Past Surgical History  Procedure Laterality Date  . Appendectomy  1976  . Tonsillectomy  1961  . Achilles tendon repair  2002    rupture  . US echocardiography  02-10-2001    EF 60-65%  . Cardiovascular stress test  11-02-2003    EF 57%    reports that he quit smoking about 37 years ago. His smoking use included Cigarettes. He has a 20 pack-year smoking history. He has never used smokeless tobacco. He reports that he drinks alcohol. He reports that he does not use illicit drugs. family history includes Diabetes in his sister; Mental illness in his mother. Allergies  Allergen Reactions  . Actifed Cold-Sinus     rash  . Lisinopril Swelling  . Pseudoephedrine     REACTION: hives     Review of Systems  Constitutional: Negative for fever, activity change, appetite change and fatigue.  Eyes: Negative for pain and visual disturbance.  Respiratory: Negative for cough, shortness of breath and wheezing.   Cardiovascular: Negative for chest pain and palpitations.  Endocrine: Negative for polydipsia and polyuria.    Genitourinary: Positive for testicular pain. Negative for dysuria and hematuria.  Musculoskeletal: Positive for arthralgias.  Skin: Negative for rash.  Neurological: Negative for dizziness, syncope and headaches.       Objective:   Physical Exam  Constitutional: He appears well-developed and well-nourished. No distress.  HENT:  Head: Normocephalic and atraumatic.  Left Ear: External ear normal.  Eyes: Conjunctivae and EOM are normal. Pupils are equal, round, and reactive to light.  Neck: Normal range of motion. Neck supple. No thyromegaly present.  Cardiovascular: Normal rate, regular rhythm and normal heart sounds.   No murmur heard. Pulmonary/Chest: No respiratory distress. He has no wheezes. He has no rales.  Musculoskeletal: He exhibits no edema.  Lymphadenopathy:    He has no cervical adenopathy.  Skin:  Feet reveal no skin lesions. Good distal foot pulses. Good capillary refill. No calluses. Normal sensation with monofilament testing   Psychiatric: He has a normal mood and affect.          Assessment & Plan:   #1 type 2 diabetes. History of slightly sub-optimal control. Recheck A1c. Patient prefers lifestyle modification versus additional medication if still elevated.   Check urine microalbumin #2 dyslipidemia. Check lipid and hepatic panel. Continue current medications  #3 health maintenance. Flu vaccine given. Continue yearly eye exam.

## 2014-09-19 NOTE — Progress Notes (Signed)
Pre visit review using our clinic review tool, if applicable. No additional management support is needed unless otherwise documented below in the visit note. 

## 2014-10-18 ENCOUNTER — Other Ambulatory Visit: Payer: Self-pay | Admitting: Family Medicine

## 2014-11-15 ENCOUNTER — Ambulatory Visit (INDEPENDENT_AMBULATORY_CARE_PROVIDER_SITE_OTHER): Payer: Medicare Other | Admitting: Cardiovascular Disease

## 2014-11-15 ENCOUNTER — Encounter: Payer: Self-pay | Admitting: Cardiovascular Disease

## 2014-11-15 VITALS — BP 130/82 | HR 61 | Ht 74.0 in | Wt 226.6 lb

## 2014-11-15 DIAGNOSIS — E785 Hyperlipidemia, unspecified: Secondary | ICD-10-CM | POA: Diagnosis not present

## 2014-11-15 DIAGNOSIS — I10 Essential (primary) hypertension: Secondary | ICD-10-CM

## 2014-11-15 NOTE — Patient Instructions (Signed)
Medication Instructions:  Your physician recommends that you continue on your current medications as directed. Please refer to the Current Medication list given to you today.   Labwork: None Ordered   Testing/Procedures: None Ordered   Follow-Up: Your physician wants you to follow-up in: 1 year with Dr. [name].  You will receive a reminder letter in the mail two months in advance. If you don't receive a letter, please call our office to schedule the follow-up appointment.   If you need a refill on your cardiac medications before your next appointment, please call your pharmacy.   Thank you for choosing [facility]! Karry Barrilleaux, RN [phone]    

## 2014-11-15 NOTE — Progress Notes (Signed)
Cardiology Office Note   Date:  11/15/2014   ID:  Todd [name], DOB 29-Dec-1945, MRN [id number]  PCP:  [name], MD  Cardiologist:   Thayer Headings, MD   Chief Complaint  Patient presents with  . Follow-up    hypertension   1. Hypercholesterolemia 2. Palpitations 3. History of chest pain- normal stress Myoview study in October, 2005 4. Diabetes mellitus 5. Hypertension 6. Atypical chest pain:     History of Present Illness:  Todd [name] is a 69 year old gentleman with a history as noted above. He is remaining quite active. He is fairly healthy. He's been exercising on a fairly regular basis.  He denies episodes of chest pain or shortness of breath.  Feb. 6, 2014: Todd [name] is doing well. He's not had any episodes of chest pain or shortness breath. He still exercising some but not as much as he would like to. He gained a little bit of weight on a cruise this past fall and still working on getting that weight off.  April, 18, 2014:  He feels ok but has had an unusual sensation across his chest. It is a similar sensation that he gets in his arms with the pravachol. He has decreased his dose and feels better. The sensation is not related to exercise, taking a deep breath, eating or drinking. It is also not related to twisting or turning of his torso.. the sensation is an off and on sensation. to last for a couple of hours.  Nov. 3, 2014:  Todd [name] is doing well. Exercising sporadically .   Sept. 3, 2015:  Todd [name] is doing ok. Not exercising as much. 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 Has gained some weight - lack of exercise, Has not changed his diet.     May 04, 2014:   Todd [name] is a 69 y.o. male who presents for follow up of his HTN and hyperlipidemia.   He's been having some episodes of CP recently.  Worse with deep breath. More fatigued for the past couple of weeks  .  Sleeping well.  Wants to get back  into an exercise regimin.   Nov. 8, 2016:  Doing well.  Fairly active.  Walks on occasion.    Past Medical History  Diagnosis Date  . DIABETES MELLITUS, TYPE II 08/08/2009  . HYPERLIPIDEMIA 08/08/2009  . URINARY INCONTINENCE 08/08/2009  . Palpitations   . Hiatal hernia     Past Surgical History  Procedure Laterality Date  . Appendectomy  1976  . Tonsillectomy  1961  . Achilles tendon repair  2002    rupture  . US echocardiography  02-10-2001    EF 60-65%  . Cardiovascular stress test  11-02-2003    EF 57%     Current Outpatient Prescriptions  Medication Sig Dispense Refill  . alfuzosin (UROXATRAL) 10 MG 24 hr tablet Take 10 mg by mouth daily with breakfast.    . AMBULATORY NON FORMULARY MEDICATION Medication Name: Home Traction Unit 1 each 0  . aspirin 81 MG tablet Take 81 mg by mouth daily.      . Coenzyme Q10 (CO Q 10 PO) Take 200 mg by mouth daily.    . fenofibrate 160 MG tablet Take 1 tablet by mouth  every day 90 tablet 2  . metFORMIN (GLUCOPHAGE) 1000 MG tablet Take 1 tablet by mouth  twice a day with meals 180 tablet 3  . polycarbophil (FIBERCON)  625 MG tablet Take 625 mg by mouth daily. Taking 2 Tablet    . pravastatin (PRAVACHOL) 40 MG tablet Take 1 tablet by mouth  every day 90 tablet 3   No current facility-administered medications for this visit.    Allergies:   Actifed cold-sinus; Lisinopril; and Pseudoephedrine    Social History:  The patient  reports that he quit smoking about 37 years ago. His smoking use included Cigarettes. He has a 20 pack-year smoking history. He has never used smokeless tobacco. He reports that he drinks alcohol. He reports that he does not use illicit drugs.   Family History:  The patient's family history includes Diabetes in his sister; Mental illness in his mother.    ROS:  Please see the history of present illness.    Review of Systems: Constitutional:  denies fever, chills, diaphoresis, appetite change and fatigue.  HEENT:  denies photophobia, eye pain, redness, hearing loss, ear pain, congestion, sore throat, rhinorrhea, sneezing, neck pain, neck stiffness and tinnitus.  Respiratory: denies SOB, DOE, cough, chest tightness, and wheezing.  Cardiovascular: denies chest pain, palpitations and leg swelling.  Gastrointestinal: denies nausea, vomiting, abdominal pain, diarrhea, constipation, blood in stool.  Genitourinary: denies dysuria, urgency, frequency, hematuria, flank pain and difficulty urinating.  Musculoskeletal: denies  myalgias, back pain, joint swelling, arthralgias and gait problem.   Skin: denies pallor, rash and wound.  Neurological: denies dizziness, seizures, syncope, weakness, light-headedness, numbness and headaches.   Hematological: denies adenopathy, easy bruising, personal or family bleeding history.  Psychiatric/ Behavioral: denies suicidal ideation, mood changes, confusion, nervousness, sleep disturbance and agitation.       All other systems are reviewed and negative.    PHYSICAL EXAM: VS:  BP 130/82 mmHg  Pulse 61  Ht 6\' 2"  (1.88 m)  Wt 226 lb 9.6 oz (102.785 kg)  BMI 29.08 kg/m2  SpO2 97% , BMI Body mass index is 29.08 kg/(m^2). GEN: Well nourished, well developed, in no acute distress HEENT: normal Neck: no JVD, carotid bruits, or masses Cardiac: RRR; no murmurs, rubs, or gallops,no edema , no chest wall tenderness. Respiratory:  clear to auscultation bilaterally, normal work of breathing GI: soft, nontender, nondistended, + BS MS: no deformity or atrophy Skin: warm and dry, no rash Neuro:  Strength and sensation are intact Psych: normal   EKG:  EKG is ordered today. The ekg ordered today demonstrates NSR at 62, Inc. LBBB   Recent Labs: 09/19/2014: ALT 28    Lipid Panel    Component Value Date/Time   CHOL 158 09/19/2014 0923   TRIG 217.0* 09/19/2014 0923   HDL 39.30 09/19/2014 0923   CHOLHDL 4 09/19/2014 0923   VLDL 43.4* 09/19/2014 0923   LDLCALC 89 12/07/2012  0903   LDLDIRECT 94.0 09/19/2014 0923      Wt Readings from Last 3 Encounters:  11/15/14 226 lb 9.6 oz (102.785 kg)  09/19/14 226 lb (102.513 kg)  08/23/14 227 lb (102.967 kg)      Other studies Reviewed: Additional studies/ records that were reviewed today include: . Review of the above records demonstrates:    ASSESSMENT AND PLAN:  1. Hypercholesterolemia- his lipids have been fairly well-controlled. His triglyceride level is moderately elevated and he is on fenofibrate. Her symptoms exercise on a regular basis. I've encouraged him to decrease intake of his carbohydrates and fats. He will have his lipids, liver checked by his medical doctor .  Encouraged him to work on exercise   2. Palpitations 3. History of chest  pain- normal stress Myoview study in October, 2005 - his chest pains are somewhat atypical.  4. Diabetes mellitus  5. Hypertension- blood pressure is  well-controlled.  Current medicines are reviewed at length with the patient today.  The patient does not have concerns regarding medicines.  The following changes have been made:  no change  Labs/ tests ordered today include:   No orders of the defined types were placed in this encounter.     Disposition:   FU with 6 months     Signed, Peighton Edgin, [name], MD  11/15/2014 8:29 AM    [facility] [place], [place], [place]  [address] Phone: [phone]; Fax: [phone]

## 2014-12-15 DIAGNOSIS — Z1389 Encounter for screening for other disorder: Secondary | ICD-10-CM | POA: Diagnosis not present

## 2014-12-15 DIAGNOSIS — N342 Other urethritis: Secondary | ICD-10-CM | POA: Diagnosis not present

## 2015-01-23 DIAGNOSIS — R3915 Urgency of urination: Secondary | ICD-10-CM | POA: Diagnosis not present

## 2015-01-23 DIAGNOSIS — N320 Bladder-neck obstruction: Secondary | ICD-10-CM | POA: Diagnosis not present

## 2015-01-23 DIAGNOSIS — R350 Frequency of micturition: Secondary | ICD-10-CM | POA: Diagnosis not present

## 2015-01-23 DIAGNOSIS — Z0000 Encounter for general adult medical examination without abnormal findings: Secondary | ICD-10-CM | POA: Diagnosis not present

## 2015-01-23 DIAGNOSIS — N5201 Erectile dysfunction due to arterial insufficiency: Secondary | ICD-10-CM | POA: Diagnosis not present

## 2015-01-31 DIAGNOSIS — E119 Type 2 diabetes mellitus without complications: Secondary | ICD-10-CM | POA: Diagnosis not present

## 2015-01-31 DIAGNOSIS — H2513 Age-related nuclear cataract, bilateral: Secondary | ICD-10-CM | POA: Diagnosis not present

## 2015-01-31 DIAGNOSIS — H35373 Puckering of macula, bilateral: Secondary | ICD-10-CM | POA: Diagnosis not present

## 2015-01-31 LAB — HM DIABETES EYE EXAM

## 2015-02-01 ENCOUNTER — Encounter: Payer: Self-pay | Admitting: Family Medicine

## 2015-02-27 ENCOUNTER — Other Ambulatory Visit: Payer: Self-pay | Admitting: Family Medicine

## 2015-03-13 ENCOUNTER — Encounter: Payer: Self-pay | Admitting: Family Medicine

## 2015-03-13 ENCOUNTER — Ambulatory Visit (INDEPENDENT_AMBULATORY_CARE_PROVIDER_SITE_OTHER): Payer: Medicare Other | Admitting: Family Medicine

## 2015-03-13 VITALS — BP 120/90 | HR 72 | Temp 97.8°F | Ht 74.0 in | Wt 227.3 lb

## 2015-03-13 DIAGNOSIS — E1165 Type 2 diabetes mellitus with hyperglycemia: Secondary | ICD-10-CM

## 2015-03-13 DIAGNOSIS — R5383 Other fatigue: Secondary | ICD-10-CM | POA: Diagnosis not present

## 2015-03-13 DIAGNOSIS — I10 Essential (primary) hypertension: Secondary | ICD-10-CM | POA: Diagnosis not present

## 2015-03-13 DIAGNOSIS — K219 Gastro-esophageal reflux disease without esophagitis: Secondary | ICD-10-CM

## 2015-03-13 LAB — TESTOSTERONE: Testosterone: 423.7 ng/dL (ref 300.00–890.00)

## 2015-03-13 LAB — HEMOGLOBIN A1C: Hgb A1c MFr Bld: 7.6 % — ABNORMAL HIGH (ref 4.6–6.5)

## 2015-03-13 NOTE — Progress Notes (Signed)
Pre visit review using our clinic review tool, if applicable. No additional management support is needed unless otherwise documented below in the visit note. 

## 2015-03-13 NOTE — Patient Instructions (Signed)
Try either OTC Nexium or Prilosec 20 mg two daily. May supplement with Pepcid or Zantac Let me know 3-4 weeks if not better.

## 2015-03-13 NOTE — Progress Notes (Signed)
Subjective:    Patient ID: Todd [name], male    DOB: [birth year], 70 y.o.   MRN: [id number]  HPI Medical follow-up  Type 2 diabetes. History of marginal control. Blood sugar stable by home readings. Last A1c 7.3%. No polyuria or polydipsia. Remains on metformin. Eye exam last fall unremarkable.  Fatigue. Has general, almost daily fatigue. Inquires about testosterone level. Not recently checked. Libido fair. Denies depression issues.  6 week history of epigastric burning. Seems worse when he lays flat. Tends to avoid eating late at night. Does eat out frequently. Has some chronic constipation issues but this seems different. No dysphagia. No appetite or weight changes. No relief with over-the-counter antacid such as Pepcid. No stool changes. No regular nonsteroidal use.  Past Medical History  Diagnosis Date  . DIABETES MELLITUS, TYPE II 08/08/2009  . HYPERLIPIDEMIA 08/08/2009  . URINARY INCONTINENCE 08/08/2009  . Palpitations   . Hiatal hernia    Past Surgical History  Procedure Laterality Date  . Appendectomy  1976  . Tonsillectomy  1961  . Achilles tendon repair  2002    rupture  . US echocardiography  02-10-2001    EF 60-65%  . Cardiovascular stress test  11-02-2003    EF 57%    reports that he quit smoking about 37 years ago. His smoking use included Cigarettes. He has a 20 pack-year smoking history. He has never used smokeless tobacco. He reports that he drinks alcohol. He reports that he does not use illicit drugs. family history includes Diabetes in his sister; Mental illness in his mother. Allergies  Allergen Reactions  . Actifed Cold-Sinus     rash  . Lisinopril Swelling  . Pseudoephedrine     REACTION: hives      Review of Systems  Constitutional: Negative for fatigue.  Eyes: Negative for visual disturbance.  Respiratory: Negative for cough, chest tightness and shortness of breath.   Cardiovascular: Negative for chest pain, palpitations and leg swelling.    Gastrointestinal: Positive for abdominal pain and constipation. Negative for nausea, vomiting, diarrhea and blood in stool.  Endocrine: Negative for polydipsia and polyuria.  Genitourinary: Negative for dysuria.  Neurological: Negative for dizziness, syncope, weakness, light-headedness and headaches.       Objective:   Physical Exam  Constitutional: He is oriented to person, place, and time. He appears well-developed and well-nourished.  HENT:  Right Ear: External ear normal.  Left Ear: External ear normal.  Mouth/Throat: Oropharynx is clear and moist.  Eyes: Pupils are equal, round, and reactive to light.  Neck: Neck supple. No thyromegaly present.  Cardiovascular: Normal rate and regular rhythm.   Pulmonary/Chest: Effort normal and breath sounds normal. No respiratory distress. He has no wheezes. He has no rales.  Abdominal: Soft. He exhibits no mass. There is no tenderness. There is no rebound and no guarding.  Musculoskeletal: He exhibits no edema.  Neurological: He is alert and oriented to person, place, and time.          Assessment & Plan:  #1 type 2 diabetes. History of marginal control. Recheck A1c.  #2 GERD. He's tried already H2-blockers without much improvement. Lifestyle modification discussed. Trial of over-the-counter prosthetic or Nexium 40 mg daily for the next month. Touch base in 3 weeks if symptoms not greatly improved  #3 fatigue. Patient inquiring about possibility of hypogonadism. We'll check total testosterone level. We discussed there are many pros and cons to discuss even if he is low and he would like to get  levels first so we might have that we might have discussion if he is looking at replacement

## 2015-03-20 ENCOUNTER — Ambulatory Visit: Payer: Medicare Other | Admitting: Family Medicine

## 2015-04-17 ENCOUNTER — Other Ambulatory Visit: Payer: Self-pay | Admitting: Family Medicine

## 2015-06-16 ENCOUNTER — Other Ambulatory Visit (INDEPENDENT_AMBULATORY_CARE_PROVIDER_SITE_OTHER): Payer: Medicare Other

## 2015-06-16 DIAGNOSIS — E1169 Type 2 diabetes mellitus with other specified complication: Secondary | ICD-10-CM | POA: Diagnosis not present

## 2015-06-16 DIAGNOSIS — IMO0002 Reserved for concepts with insufficient information to code with codable children: Secondary | ICD-10-CM

## 2015-06-16 DIAGNOSIS — E1165 Type 2 diabetes mellitus with hyperglycemia: Secondary | ICD-10-CM

## 2015-06-16 LAB — HEMOGLOBIN A1C: Hgb A1c MFr Bld: 7.7 % — ABNORMAL HIGH (ref 4.6–6.5)

## 2015-06-21 ENCOUNTER — Telehealth: Payer: Self-pay | Admitting: Cardiovascular Disease

## 2015-06-21 NOTE — Telephone Encounter (Signed)
New message  Pt called states that he is having chest discomfort ( No further details) made an appt with PA Ellen Henri on 07/05/2015 at 9:30a. Pt request a call back from the nurse to discuss in detail

## 2015-06-21 NOTE — Telephone Encounter (Signed)
Left pt a message to call back. 

## 2015-06-22 NOTE — Telephone Encounter (Signed)
Left message for patient to call me at the office 

## 2015-06-23 ENCOUNTER — Ambulatory Visit (INDEPENDENT_AMBULATORY_CARE_PROVIDER_SITE_OTHER): Payer: Medicare Other | Admitting: Family Medicine

## 2015-06-23 ENCOUNTER — Encounter: Payer: Self-pay | Admitting: Family Medicine

## 2015-06-23 VITALS — BP 148/84 | HR 70 | Temp 98.0°F | Ht 74.0 in | Wt 226.0 lb

## 2015-06-23 DIAGNOSIS — E1165 Type 2 diabetes mellitus with hyperglycemia: Secondary | ICD-10-CM

## 2015-06-23 NOTE — Progress Notes (Signed)
   Subjective:    Patient ID: Todd [name], male    DOB: 13-Nov-1945, 70 y.o.   MRN: [id number]  HPI Follow-up type 2 diabetes. Recent A1c 7.7%. Takes metformin 1000 mg twice daily. Poor compliance with exercise and diet recently. He's had some mild weight gain. He is reluctant to take additional medication at this time.  Past Medical History  Diagnosis Date  . DIABETES MELLITUS, TYPE II 08/08/2009  . HYPERLIPIDEMIA 08/08/2009  . URINARY INCONTINENCE 08/08/2009  . Palpitations   . Hiatal hernia    Past Surgical History  Procedure Laterality Date  . Appendectomy  1976  . Tonsillectomy  1961  . Achilles tendon repair  2002    rupture  . US echocardiography  02-10-2001    EF 60-65%  . Cardiovascular stress test  11-02-2003    EF 57%    reports that he quit smoking about 38 years ago. His smoking use included Cigarettes. He has a 20 pack-year smoking history. He has never used smokeless tobacco. He reports that he drinks alcohol. He reports that he does not use illicit drugs. family history includes Diabetes in his sister; Mental illness in his mother. Allergies  Allergen Reactions  . Actifed Cold-Sinus     rash  . Lisinopril Swelling  . Pseudoephedrine     REACTION: hives      Review of Systems  Constitutional: Negative for fatigue.  Eyes: Negative for visual disturbance.  Respiratory: Negative for cough, chest tightness and shortness of breath.   Cardiovascular: Negative for chest pain, palpitations and leg swelling.  Neurological: Negative for dizziness, syncope, weakness, light-headedness and headaches.       Objective:   Physical Exam  Constitutional: He is oriented to person, place, and time. He appears well-developed and well-nourished.  HENT:  Right Ear: External ear normal.  Left Ear: External ear normal.  Mouth/Throat: Oropharynx is clear and moist.  Eyes: Pupils are equal, round, and reactive to light.  Neck: Neck supple. No thyromegaly present.    Cardiovascular: Normal rate and regular rhythm.   Pulmonary/Chest: Effort normal and breath sounds normal. No respiratory distress. He has no wheezes. He has no rales.  Musculoskeletal: He exhibits no edema.  Neurological: He is alert and oriented to person, place, and time.          Assessment & Plan:  Type 2 diabetes. Suboptimal control. Recent A1c 7.7%. We discussed additional medication options. He is not interested at this point. He prefers 3 months of exercise and weight loss. Recheck A1c in 3 months. Blood pressure also was slightly up today and will reassess at that point and consider additional medication if not better controlled  Todd Post MD [facility] at [facility]

## 2015-06-23 NOTE — Telephone Encounter (Signed)
Left message for patient to call back.  Closing encounter due to unable to reach patient.

## 2015-06-23 NOTE — Progress Notes (Signed)
Pre visit review using our clinic review tool, if applicable. No additional management support is needed unless otherwise documented below in the visit note. 

## 2015-07-05 ENCOUNTER — Ambulatory Visit: Payer: Medicare Other | Admitting: Cardiology

## 2015-07-06 ENCOUNTER — Encounter: Payer: Self-pay | Admitting: Cardiology

## 2015-07-06 ENCOUNTER — Ambulatory Visit (INDEPENDENT_AMBULATORY_CARE_PROVIDER_SITE_OTHER): Payer: Medicare Other | Admitting: Cardiology

## 2015-07-06 VITALS — BP 140/80 | HR 60 | Ht 74.0 in | Wt 227.0 lb

## 2015-07-06 DIAGNOSIS — R079 Chest pain, unspecified: Secondary | ICD-10-CM

## 2015-07-06 NOTE — Patient Instructions (Signed)
Medication Instructions:  Your physician recommends that you continue on your current medications as directed. Please refer to the Current Medication list given to you today.   Labwork: NONE  Testing/Procedures: Your physician has requested that you have en exercise stress myoview. For further information please visit [email or web address]. Please follow instruction sheet, as given.   Follow-Up: DR. Acie Fredrickson 11/2015  Any Other Special Instructions Will Be Listed Below (If Applicable).     If you need a refill on your cardiac medications before your next appointment, please call your pharmacy.

## 2015-07-06 NOTE — Progress Notes (Signed)
07/06/2015 Todd [name]   [birth year]  [id number]  Primary Physician [name], MD Primary Cardiologist: Dr. Acie Fredrickson   Reason for Visit/CC: Chest Pain  HPI: 70 y/o AA male, followed by Dr. Acie Fredrickson, who presents to clinic for CP evaluation. He has a h/o DM2, HTN and HLD. No known h/o CAD. He does have a hiatal hernia. He has a NST in 2014 that was negative for ischemia.   He notes recent episode of subseternal chest pressure. It occurred at rest. It did not radiate. It felt like indestion but not after eating. Lasted for roughly 1 hr, off and on, before spontaneously resolving. No exertional component. He denies any further recurrence. Office EKG today shows NSR w/o ischemia. HR 60 bpm. BP is 140/80.    Current Outpatient Prescriptions  Medication Sig Dispense Refill  . alfuzosin (UROXATRAL) 10 MG 24 hr tablet Take 10 mg by mouth daily with breakfast.    . aspirin 81 MG tablet Take 81 mg by mouth daily.      . Coenzyme Q10 (CO Q 10 PO) Take 200 mg by mouth daily.    . fenofibrate 160 MG tablet Take 1 tablet by mouth  every day 90 tablet 2  . metFORMIN (GLUCOPHAGE) 1000 MG tablet Take 1 tablet by mouth  twice a day with meals 180 tablet 3  . polycarbophil (FIBERCON) 625 MG tablet Take 625 mg by mouth daily. Taking 2 Tablet    . pravastatin (PRAVACHOL) 40 MG tablet Take 1 tablet by mouth  every day 90 tablet 3   No current facility-administered medications for this visit.    Allergies  Allergen Reactions  . Actifed Cold-Sinus     rash  . Lisinopril Swelling  . Pseudoephedrine     REACTION: hives    Social History   Social History  . Marital Status: Married    Spouse Name: N/A  . Number of Children: N/A  . Years of Education: N/A   Occupational History  . Not on file.   Social History Main Topics  . Smoking status: Former Smoker -- 2.00 packs/day for 10 years    Types: Cigarettes    Quit date: 04/19/1977  . Smokeless tobacco: Never Used  . Alcohol Use: Yes    Comment: occasionally  . Drug Use: No  . Sexual Activity: Not on file   Other Topics Concern  . Not on file   Social History Narrative     Review of Systems: General: negative for chills, fever, night sweats or weight changes.  Cardiovascular: negative for chest pain, dyspnea on exertion, edema, orthopnea, palpitations, paroxysmal nocturnal dyspnea or shortness of breath Dermatological: negative for rash Respiratory: negative for cough or wheezing Urologic: negative for hematuria Abdominal: negative for nausea, vomiting, diarrhea, bright red blood per rectum, melena, or hematemesis Neurologic: negative for visual changes, syncope, or dizziness All other systems reviewed and are otherwise negative except as noted above.    Blood pressure 140/80, pulse 60, height 6\' 2"  (1.88 m), weight 227 lb (102.967 kg).  General appearance: alert, cooperative and no distress Neck: no carotid bruit and no JVD Lungs: clear to auscultation bilaterally Heart: regular rate and rhythm, S1, S2 normal, no murmur, click, rub or gallop Extremities: no LEE Pulses: 2+ and symmetric Skin: Skin color, texture, turgor normal. No rashes or lesions Neurologic: Grossly normal  EKG NSR. No ischemia. 60 bpm   ASSESSMENT AND PLAN:   1. Chest Pain with Moderate Risk for Cardiac Etiology: Mixed typical +  atypical features. His risk factors include DM and male sex. We will risk stratify with an exercise nuclear stress test.   2. DM: on Metformin. Per PCP.   3. HLD: on statin therapy. Lipids followed by PCP.   4. HTN: has been diet controlled over the years. However BP is 140/80 in clinic today. Patient instructed to monitor closely at home. We will recheck this at his next f/u visit. If still elevated, we may need to consider adding a low dose antihypertensive.     Lyda Jester PA-C 07/06/2015 9:56 AM

## 2015-07-18 ENCOUNTER — Telehealth (HOSPITAL_COMMUNITY): Payer: Self-pay | Admitting: *Deleted

## 2015-07-18 NOTE — Telephone Encounter (Signed)
Left message on voicemail per DPR in reference to upcoming appointment scheduled on 07/21/15 with detailed instructions given per Myocardial Perfusion Study Information Sheet for the test. LM to arrive 15 minutes early, and that it is imperative to arrive on time for appointment to keep from having the test rescheduled. If you need to cancel or reschedule your appointment, please call the office within 24 hours of your appointment. Failure to do so may result in a cancellation of your appointment, and a $50 no show fee. Phone number given for call back for any questions. Hubbard Robinson, RN

## 2015-07-21 ENCOUNTER — Ambulatory Visit (HOSPITAL_COMMUNITY): Payer: Medicare Other | Attending: Cardiology

## 2015-07-21 DIAGNOSIS — R079 Chest pain, unspecified: Secondary | ICD-10-CM | POA: Diagnosis not present

## 2015-07-21 DIAGNOSIS — I10 Essential (primary) hypertension: Secondary | ICD-10-CM | POA: Diagnosis not present

## 2015-07-21 LAB — MYOCARDIAL PERFUSION IMAGING
Estimated workload: 10.1 METS
Exercise duration (min): 5 min
Exercise duration (sec): 0 s
LV dias vol: 122 mL (ref 62–150)
LV sys vol: 56 mL
MPHR: 152 {beats}/min
Peak HR: 137 {beats}/min
Percent HR: 90 %
RATE: 0.23
Rest HR: 61 {beats}/min
SDS: 7
SRS: 3
SSS: 9
TID: 0.89

## 2015-07-21 MED ORDER — TECHNETIUM TC 99M TETROFOSMIN IV KIT
31.6000 | PACK | Freq: Once | INTRAVENOUS | Status: AC | PRN
  Administered 2015-07-21: 32 via INTRAVENOUS
  Filled 2015-07-21: qty 32

## 2015-07-21 MED ORDER — TECHNETIUM TC 99M TETROFOSMIN IV KIT
10.4000 | PACK | Freq: Once | INTRAVENOUS | Status: AC | PRN
  Administered 2015-07-21: 10 via INTRAVENOUS
  Filled 2015-07-21: qty 10

## 2015-08-01 ENCOUNTER — Other Ambulatory Visit: Payer: Self-pay | Admitting: Family Medicine

## 2015-08-14 ENCOUNTER — Encounter: Payer: Self-pay | Admitting: Cardiovascular Disease

## 2015-09-25 ENCOUNTER — Ambulatory Visit (INDEPENDENT_AMBULATORY_CARE_PROVIDER_SITE_OTHER): Payer: Medicare Other | Admitting: Family Medicine

## 2015-09-25 ENCOUNTER — Ambulatory Visit: Payer: Self-pay | Admitting: Family Medicine

## 2015-09-25 ENCOUNTER — Encounter: Payer: Self-pay | Admitting: Family Medicine

## 2015-09-25 VITALS — BP 144/80 | HR 75 | Temp 97.9°F | Ht 74.0 in | Wt 225.0 lb

## 2015-09-25 DIAGNOSIS — E1165 Type 2 diabetes mellitus with hyperglycemia: Secondary | ICD-10-CM

## 2015-09-25 DIAGNOSIS — I10 Essential (primary) hypertension: Secondary | ICD-10-CM | POA: Diagnosis not present

## 2015-09-25 DIAGNOSIS — M5417 Radiculopathy, lumbosacral region: Secondary | ICD-10-CM

## 2015-09-25 DIAGNOSIS — Z23 Encounter for immunization: Secondary | ICD-10-CM | POA: Diagnosis not present

## 2015-09-25 DIAGNOSIS — M5416 Radiculopathy, lumbar region: Secondary | ICD-10-CM

## 2015-09-25 LAB — POCT GLYCOSYLATED HEMOGLOBIN (HGB A1C): Hemoglobin A1C: 7.6

## 2015-09-25 NOTE — Progress Notes (Signed)
Pre visit review using our clinic review tool, if applicable. No additional management support is needed unless otherwise documented below in the visit note. 

## 2015-09-25 NOTE — Progress Notes (Signed)
Subjective:     Patient ID: Todd [name], male   DOB: 11/09/1945, 70 y.o.   MRN: [id number]  HPI Patient seen for medical follow-up and also with new problem of right lumbar back pain with radiation down the right lower extremity. He relates onset a little over a month ago of low back pain on the right side. He first notices after driving couple hours. No specific injury. Pain is sharp and 10 out of 10 at its worse. He's tried multiple conservative therapies including heat, ice, chiropractic care, and massage therapy without much improvement. Pain at its best is 5 out of 10. Pain is worse with standing. No other alleviating factors. Does not have any associated fever, chills, appetite or weight changes, urine incontinence or stool incontinence. He has occasional numbness of the right anterior thigh and pain that radiates down to the level of the knee. He's had previous cervical disc problems but no previous lumbar disc issues  Type 2 diabetes. Last A1c 7.7%. Not monitoring much lately. Exercise limited by low back pain. Also has hyperlipidemia and remains on pravastatin for that  Past Medical History:  Diagnosis Date  . DIABETES MELLITUS, TYPE II 08/08/2009  . Hiatal hernia   . HYPERLIPIDEMIA 08/08/2009  . Palpitations   . URINARY INCONTINENCE 08/08/2009   Past Surgical History:  Procedure Laterality Date  . ACHILLES TENDON REPAIR  2002   rupture  . APPENDECTOMY  1976  . CARDIOVASCULAR STRESS TEST  11-02-2003   EF 57%  . TONSILLECTOMY  1961  . US ECHOCARDIOGRAPHY  02-10-2001   EF 60-65%    reports that he quit smoking about 38 years ago. His smoking use included Cigarettes. He has a 20.00 pack-year smoking history. He has never used smokeless tobacco. He reports that he drinks alcohol. He reports that he does not use drugs. family history includes Diabetes in his sister; Mental illness in his mother. Allergies  Allergen Reactions  . Actifed Cold-Sinus     rash  . Lisinopril Swelling  .  Pseudoephedrine     REACTION: hives     Review of Systems  Constitutional: Negative for activity change, appetite change, fatigue and fever.  Eyes: Negative for visual disturbance.  Respiratory: Negative for cough, chest tightness and shortness of breath.   Cardiovascular: Negative for chest pain, palpitations and leg swelling.  Gastrointestinal: Negative for abdominal pain and vomiting.  Genitourinary: Negative for dysuria, flank pain and hematuria.  Musculoskeletal: Positive for back pain. Negative for joint swelling.  Neurological: Positive for numbness. Negative for dizziness, syncope, weakness, light-headedness and headaches.       Objective:   Physical Exam  Constitutional: He is oriented to person, place, and time. He appears well-developed and well-nourished. No distress.  Neck: No thyromegaly present.  Cardiovascular: Normal rate, regular rhythm and normal heart sounds.   No murmur heard. Pulmonary/Chest: Effort normal and breath sounds normal. No respiratory distress. He has no wheezes. He has no rales.  Musculoskeletal: He exhibits no edema.  Trait leg raise are negative  Neurological: He is alert and oriented to person, place, and time. No cranial nerve deficit.  Full strength throughout lower extremities. He has slightly diminished right knee reflex compared to the left. Ankle reflexes are symmetric  Skin: No rash noted.       Assessment:     #1 progressive right lumbar back pain with right radiculitis symptoms with slightly diminished right knee reflex compared to left. Rule out L3-L4 right-sided nerve root compression  #  2 type 2 diabetes with history of suboptimal control    Plan:     -Recheck A1c -Flu vaccine given -Schedule MRI lumbar spine to further assess-as he has tried multiple conservative therapies as above -A1C 7.6% and we've elected to observe for now and repeat in 3 months.  He is not willing to go on new medication at this time.  [name] MD [facility]

## 2015-09-25 NOTE — Patient Instructions (Signed)

## 2015-09-26 DIAGNOSIS — Z86010 Personal history of colonic polyps: Secondary | ICD-10-CM | POA: Diagnosis not present

## 2015-09-26 DIAGNOSIS — K5900 Constipation, unspecified: Secondary | ICD-10-CM | POA: Diagnosis not present

## 2015-09-26 DIAGNOSIS — K5730 Diverticulosis of large intestine without perforation or abscess without bleeding: Secondary | ICD-10-CM | POA: Diagnosis not present

## 2015-09-26 DIAGNOSIS — Z1211 Encounter for screening for malignant neoplasm of colon: Secondary | ICD-10-CM | POA: Diagnosis not present

## 2015-10-01 ENCOUNTER — Ambulatory Visit
Admission: RE | Admit: 2015-10-01 | Discharge: 2015-10-01 | Disposition: A | Discharge status: Home / Self Care | Payer: Medicare Other | Source: Ambulatory Visit | Attending: Family Medicine | Admitting: Family Medicine

## 2015-10-01 DIAGNOSIS — M5416 Radiculopathy, lumbar region: Secondary | ICD-10-CM

## 2015-10-01 DIAGNOSIS — M5126 Other intervertebral disc displacement, lumbar region: Secondary | ICD-10-CM | POA: Diagnosis not present

## 2015-10-03 ENCOUNTER — Other Ambulatory Visit: Payer: Self-pay

## 2015-10-03 DIAGNOSIS — M5416 Radiculopathy, lumbar region: Secondary | ICD-10-CM

## 2015-10-16 DIAGNOSIS — M5126 Other intervertebral disc displacement, lumbar region: Secondary | ICD-10-CM | POA: Diagnosis not present

## 2015-10-16 DIAGNOSIS — M5416 Radiculopathy, lumbar region: Secondary | ICD-10-CM | POA: Diagnosis not present

## 2015-10-19 DIAGNOSIS — K5900 Constipation, unspecified: Secondary | ICD-10-CM | POA: Diagnosis not present

## 2015-10-19 DIAGNOSIS — R140 Abdominal distension (gaseous): Secondary | ICD-10-CM | POA: Diagnosis not present

## 2015-10-19 DIAGNOSIS — R1033 Periumbilical pain: Secondary | ICD-10-CM | POA: Diagnosis not present

## 2015-10-23 ENCOUNTER — Other Ambulatory Visit: Payer: Self-pay | Admitting: Gastroenterology

## 2015-10-23 DIAGNOSIS — R1033 Periumbilical pain: Secondary | ICD-10-CM

## 2015-10-23 DIAGNOSIS — K5900 Constipation, unspecified: Secondary | ICD-10-CM

## 2015-10-31 ENCOUNTER — Ambulatory Visit
Admission: RE | Admit: 2015-10-31 | Discharge: 2015-10-31 | Disposition: A | Discharge status: Home / Self Care | Payer: Medicare Other | Source: Ambulatory Visit | Attending: Gastroenterology | Admitting: Gastroenterology

## 2015-10-31 DIAGNOSIS — K5900 Constipation, unspecified: Secondary | ICD-10-CM

## 2015-10-31 DIAGNOSIS — K5790 Diverticulosis of intestine, part unspecified, without perforation or abscess without bleeding: Secondary | ICD-10-CM | POA: Diagnosis not present

## 2015-10-31 DIAGNOSIS — R1033 Periumbilical pain: Secondary | ICD-10-CM

## 2015-10-31 MED ORDER — IOPAMIDOL (ISOVUE-300) INJECTION 61%
100.0000 mL | Freq: Once | INTRAVENOUS | Status: AC | PRN
  Administered 2015-10-31: 100 mL via INTRAVENOUS

## 2015-11-02 DIAGNOSIS — M5416 Radiculopathy, lumbar region: Secondary | ICD-10-CM | POA: Diagnosis not present

## 2015-11-02 DIAGNOSIS — M5126 Other intervertebral disc displacement, lumbar region: Secondary | ICD-10-CM | POA: Diagnosis not present

## 2015-11-07 DIAGNOSIS — R350 Frequency of micturition: Secondary | ICD-10-CM | POA: Diagnosis not present

## 2015-11-07 DIAGNOSIS — N401 Enlarged prostate with lower urinary tract symptoms: Secondary | ICD-10-CM | POA: Diagnosis not present

## 2015-11-20 ENCOUNTER — Other Ambulatory Visit: Payer: Self-pay | Admitting: Family Medicine

## 2015-11-23 ENCOUNTER — Ambulatory Visit: Payer: Medicare Other | Admitting: Cardiovascular Disease

## 2015-11-27 DIAGNOSIS — R1013 Epigastric pain: Secondary | ICD-10-CM | POA: Diagnosis not present

## 2015-11-27 DIAGNOSIS — Z86010 Personal history of colonic polyps: Secondary | ICD-10-CM | POA: Diagnosis not present

## 2015-11-27 DIAGNOSIS — K635 Polyp of colon: Secondary | ICD-10-CM | POA: Diagnosis not present

## 2015-11-27 DIAGNOSIS — Z1211 Encounter for screening for malignant neoplasm of colon: Secondary | ICD-10-CM | POA: Diagnosis not present

## 2015-11-27 DIAGNOSIS — D125 Benign neoplasm of sigmoid colon: Secondary | ICD-10-CM | POA: Diagnosis not present

## 2015-12-05 DIAGNOSIS — M5416 Radiculopathy, lumbar region: Secondary | ICD-10-CM | POA: Diagnosis not present

## 2015-12-05 DIAGNOSIS — M5126 Other intervertebral disc displacement, lumbar region: Secondary | ICD-10-CM | POA: Diagnosis not present

## 2015-12-12 ENCOUNTER — Encounter: Payer: Self-pay | Admitting: Cardiovascular Disease

## 2015-12-12 ENCOUNTER — Ambulatory Visit (INDEPENDENT_AMBULATORY_CARE_PROVIDER_SITE_OTHER): Payer: Medicare Other | Admitting: Cardiovascular Disease

## 2015-12-12 VITALS — BP 126/84 | HR 69 | Ht 74.0 in | Wt 221.8 lb

## 2015-12-12 DIAGNOSIS — E785 Hyperlipidemia, unspecified: Secondary | ICD-10-CM | POA: Diagnosis not present

## 2015-12-12 DIAGNOSIS — I10 Essential (primary) hypertension: Secondary | ICD-10-CM

## 2015-12-12 DIAGNOSIS — I2510 Atherosclerotic heart disease of native coronary artery without angina pectoris: Secondary | ICD-10-CM

## 2015-12-12 DIAGNOSIS — Z79899 Other long term (current) drug therapy: Secondary | ICD-10-CM | POA: Diagnosis not present

## 2015-12-12 DIAGNOSIS — I2584 Coronary atherosclerosis due to calcified coronary lesion: Secondary | ICD-10-CM

## 2015-12-12 LAB — COMPREHENSIVE METABOLIC PANEL
ALT: 25 U/L (ref 9–46)
AST: 13 U/L (ref 10–35)
Albumin: 4.3 g/dL (ref 3.6–5.1)
Alkaline Phosphatase: 54 U/L (ref 40–115)
BUN: 17 mg/dL (ref 7–25)
CO2: 28 mmol/L (ref 20–31)
Calcium: 9.9 mg/dL (ref 8.6–10.3)
Chloride: 101 mmol/L (ref 98–110)
Creat: 1.06 mg/dL (ref 0.70–1.25)
Glucose, Bld: 184 mg/dL — ABNORMAL HIGH (ref 65–99)
Potassium: 4.7 mmol/L (ref 3.5–5.3)
Sodium: 137 mmol/L (ref 135–146)
Total Bilirubin: 0.3 mg/dL (ref 0.2–1.2)
Total Protein: 7.2 g/dL (ref 6.1–8.1)

## 2015-12-12 LAB — LIPID PANEL
Cholesterol: 173 mg/dL (ref <200)
HDL: 40 mg/dL — ABNORMAL LOW (ref >40)
LDL Cholesterol: 75 mg/dL (ref <100)
Total CHOL/HDL Ratio: 4.3 Ratio (ref <5.0)
Triglycerides: 289 mg/dL — ABNORMAL HIGH (ref <150)
VLDL: 58 mg/dL — ABNORMAL HIGH (ref <30)

## 2015-12-12 MED ORDER — ROSUVASTATIN CALCIUM 10 MG PO TABS: 10.0000 mg | ORAL_TABLET | Freq: Every day | ORAL | 3 refills | Status: DC

## 2015-12-12 NOTE — Patient Instructions (Addendum)
Medication Instructions:   Your physician has recommended you make the following change in your medication:   1) STOP Pravachol  2) START Crestor 10 mg daily  --- If you need a refill on your cardiac medications before your next appointment, please call your pharmacy. ---  Labwork:  Today: Fasting blood work: CMET & Lipid profile   Your physician recommends that you return for lab work in: 3 months for fasting blood work - CMET & Lipid profile  Testing/Procedures:  None ordered  Follow-Up:  Your physician wants you to follow-up in: 6 months with Dr. Acie Fredrickson.  You will receive a reminder letter in the mail two months in advance. If you don't receive a letter, please call our office to schedule the follow-up appointment.   Thank you for choosing [facility]!!

## 2015-12-12 NOTE — Progress Notes (Signed)
Cardiology Office Note   Date:  12/12/2015   ID:  Todd [name], DOB 1945-10-13, MRN [id number]  PCP:  [name], MD  Cardiologist:   Mertie Moores, MD   Chief Complaint  Patient presents with  . Hyperlipidemia   1. Hypercholesterolemia 2. Palpitations 3. History of chest pain- normal stress Myoview study in October, 2005 4. Diabetes mellitus 5. Hypertension 6. Atypical chest pain:    Todd [name] is a 70 year old gentleman with a history as noted above. He is remaining quite active. He is fairly healthy. He's been exercising on a fairly regular basis.  He denies episodes of chest pain or shortness of breath.  Feb. 6, 2014: Todd [name] is doing well. He's not had any episodes of chest pain or shortness breath. He still exercising some but not as much as he would like to. He gained a little bit of weight on a cruise this past fall and still working on getting that weight off.  April, 18, 2014:  He feels ok but has had an unusual sensation across his chest. It is a similar sensation that he gets in his arms with the pravachol. He has decreased his dose and feels better. The sensation is not related to exercise, taking a deep breath, eating or drinking. It is also not related to twisting or turning of his torso.. the sensation is an off and on sensation. to last for a couple of hours.  Nov. 3, 2014:  Todd [name] is doing well. Exercising sporadically .   Sept. 3, 2015:  Todd [name] is doing ok. Not exercising as much. 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 Has gained some weight - lack of exercise, Has not changed his diet.     May 04, 2014:   Todd [name] is a 70 y.o. male who presents for follow up of his HTN and hyperlipidemia.   He's been having some episodes of CP recently.  Worse with deep breath. More fatigued for the past couple of weeks  .  Sleeping well.  Wants to get back into an exercise regimin.   Nov. 8,  2016:  Doing well.  Fairly active.  Walks on occasion.    Dec. 5, 2017:  Todd [name] is doing well. Had a CT of the abdomen that mentioned possible coronary artery calcifications.  Had a low risk myoview in July, 2017. Lipids in Sept. 2016.  are ok.   Trigs = 217, LDL was 94 Has a physical coming up in a week or so   No CP.   Still very active .   Limited by his back pain .     Past Medical History:  Diagnosis Date  . DIABETES MELLITUS, TYPE II 08/08/2009  . Hiatal hernia   . HYPERLIPIDEMIA 08/08/2009  . Palpitations   . URINARY INCONTINENCE 08/08/2009    Past Surgical History:  Procedure Laterality Date  . ACHILLES TENDON REPAIR  2002   rupture  . APPENDECTOMY  1976  . CARDIOVASCULAR STRESS TEST  11-02-2003   EF 57%  . TONSILLECTOMY  1961  . US ECHOCARDIOGRAPHY  02-10-2001   EF 60-65%     Current Outpatient Prescriptions  Medication Sig Dispense Refill  . alfuzosin (UROXATRAL) 10 MG 24 hr tablet Take 10 mg by mouth daily with breakfast.    . aspirin 81 MG tablet Take 81 mg by mouth daily.      . Coenzyme Q10 (CO Q 10 PO) Take 200  mg by mouth daily.    . fenofibrate 160 MG tablet TAKE 1 TABLET BY MOUTH  EVERY DAY 90 tablet 1  . metFORMIN (GLUCOPHAGE) 1000 MG tablet Take 1 tablet by mouth  twice a day with meals 180 tablet 1  . polycarbophil (FIBERCON) 625 MG tablet Take 625 mg by mouth daily. Taking 2 Tablet    . pravastatin (PRAVACHOL) 40 MG tablet Take 1 tablet by mouth  every day 90 tablet 1   No current facility-administered medications for this visit.     Allergies:   Actifed cold-sinus; Lisinopril; and Pseudoephedrine    Social History:  The patient  reports that he quit smoking about 38 years ago. His smoking use included Cigarettes. He has a 20.00 pack-year smoking history. He has never used smokeless tobacco. He reports that he drinks alcohol. He reports that he does not use drugs.   Family History:  The patient's family history includes Diabetes in his sister;  Mental illness in his mother.    ROS:  Please see the history of present illness.    Review of Systems: Constitutional:  denies fever, chills, diaphoresis, appetite change and fatigue.  HEENT: denies photophobia, eye pain, redness, hearing loss, ear pain, congestion, sore throat, rhinorrhea, sneezing, neck pain, neck stiffness and tinnitus.  Respiratory: denies SOB, DOE, cough, chest tightness, and wheezing.  Cardiovascular: denies chest pain, palpitations and leg swelling.  Gastrointestinal: denies nausea, vomiting, abdominal pain, diarrhea, constipation, blood in stool.  Genitourinary: denies dysuria, urgency, frequency, hematuria, flank pain and difficulty urinating.  Musculoskeletal: denies  myalgias, back pain, joint swelling, arthralgias and gait problem.   Skin: denies pallor, rash and wound.  Neurological: denies dizziness, seizures, syncope, weakness, light-headedness, numbness and headaches.   Hematological: denies adenopathy, easy bruising, personal or family bleeding history.  Psychiatric/ Behavioral: denies suicidal ideation, mood changes, confusion, nervousness, sleep disturbance and agitation.       All other systems are reviewed and negative.    PHYSICAL EXAM: VS:  BP 126/84 (BP Location: Left Arm, Patient Position: Sitting, Cuff Size: Normal)   Pulse 69   Ht 6\' 2"  (1.88 m)   Wt 221 lb 12.8 oz (100.6 kg)   BMI 28.48 kg/m  , BMI Body mass index is 28.48 kg/m. GEN: Well nourished, well developed, in no acute distress  HEENT: normal  Neck: no JVD, carotid bruits, or masses Cardiac: RRR; no murmurs, rubs, or gallops,no edema , no chest wall tenderness. Respiratory:  clear to auscultation bilaterally, normal work of breathing GI: soft, nontender, nondistended, + BS MS: no deformity or atrophy  Skin: warm and dry, no rash Neuro:  Strength and sensation are intact Psych: normal   EKG:  EKG is ordered today. The ekg ordered today demonstrates NSR at 69.    Occasional PACs.   Recent Labs: No results found for requested labs within last 8760 hours.    Lipid Panel    Component Value Date/Time   CHOL 158 09/19/2014 0923   TRIG 217.0 (H) 09/19/2014 0923   HDL 39.30 09/19/2014 0923   CHOLHDL 4 09/19/2014 0923   VLDL 43.4 (H) 09/19/2014 0923   LDLCALC 89 12/07/2012 0903   LDLDIRECT 94.0 09/19/2014 0923      Wt Readings from Last 3 Encounters:  12/12/15 221 lb 12.8 oz (100.6 kg)  09/25/15 225 lb (102.1 kg)  07/06/15 227 lb (103 kg)      Other studies Reviewed: Additional studies/ records that were reviewed today include: . Review of the above  records demonstrates:    ASSESSMENT AND PLAN:  1. Hypercholesterolemia- his lipids have been fairly well-controlled. His triglyceride level is moderately elevated and he is on fenofibrate.   He had a CT scan recently that shows cornaory artery calcifications. I would like to change his pravachol to crestor  Will DC prava, start crestor 10 mg a d Check cmet and lipids today and again in 3 months    2. Palpitations 3. History of chest pain- normal stress Myoview study in July , 2017.   4. Diabetes mellitus  5. Hypertension- blood pressure is  well-controlled.  Current medicines are reviewed at length with the patient today.  The patient does not have concerns regarding medicines.  The following changes have been made:  no change  Labs/ tests ordered today include:   No orders of the defined types were placed in this encounter.    Disposition:   FU with 6 months     Signed, Mertie Moores, MD  12/12/2015 9:24 AM    [facility] [place], [place], [place]  [address] Phone: [phone]; Fax: [phone]

## 2015-12-19 ENCOUNTER — Ambulatory Visit: Payer: Medicare Other | Admitting: Family Medicine

## 2015-12-21 ENCOUNTER — Encounter: Payer: Self-pay | Admitting: Cardiovascular Disease

## 2015-12-25 ENCOUNTER — Other Ambulatory Visit: Payer: Self-pay | Admitting: *Deleted

## 2015-12-25 ENCOUNTER — Ambulatory Visit: Payer: Medicare Other | Admitting: Family Medicine

## 2015-12-25 MED ORDER — ROSUVASTATIN CALCIUM 10 MG PO TABS: 10.0000 mg | ORAL_TABLET | Freq: Every day | ORAL | 3 refills | Status: DC

## 2016-01-11 DIAGNOSIS — E119 Type 2 diabetes mellitus without complications: Secondary | ICD-10-CM | POA: Diagnosis not present

## 2016-01-11 DIAGNOSIS — H35373 Puckering of macula, bilateral: Secondary | ICD-10-CM | POA: Diagnosis not present

## 2016-01-11 DIAGNOSIS — H2513 Age-related nuclear cataract, bilateral: Secondary | ICD-10-CM | POA: Diagnosis not present

## 2016-01-11 DIAGNOSIS — Z7984 Long term (current) use of oral hypoglycemic drugs: Secondary | ICD-10-CM | POA: Diagnosis not present

## 2016-01-11 LAB — HM DIABETES EYE EXAM

## 2016-01-12 ENCOUNTER — Ambulatory Visit (INDEPENDENT_AMBULATORY_CARE_PROVIDER_SITE_OTHER): Payer: Medicare Other | Admitting: Family Medicine

## 2016-01-12 ENCOUNTER — Encounter: Payer: Self-pay | Admitting: Family Medicine

## 2016-01-12 VITALS — BP 128/68 | HR 68 | Temp 97.5°F | Ht 74.0 in | Wt 225.1 lb

## 2016-01-12 DIAGNOSIS — Z0000 Encounter for general adult medical examination without abnormal findings: Secondary | ICD-10-CM

## 2016-01-12 DIAGNOSIS — E1165 Type 2 diabetes mellitus with hyperglycemia: Secondary | ICD-10-CM

## 2016-01-12 DIAGNOSIS — I10 Essential (primary) hypertension: Secondary | ICD-10-CM

## 2016-01-12 LAB — MICROALBUMIN / CREATININE URINE RATIO
Creatinine,U: 178.3 mg/dL
Microalb Creat Ratio: 0.3 mg/g (ref 0.0–30.0)
Microalb, Ur: 0.5 mg/dL (ref 0.0–1.9)

## 2016-01-12 LAB — HEMOGLOBIN A1C: Hgb A1c MFr Bld: 7.7 % — ABNORMAL HIGH (ref 4.6–6.5)

## 2016-01-12 NOTE — Progress Notes (Addendum)
Subjective:   Todd [name] is a 71 y.o. male who presents for Medicare Annual/Subsequent preventive examination.  The Patient was informed that the wellness visit is to identify future health risk and educate and initiate measures that can reduce risk for increased disease through the lifespan.    NO ROS; Medicare Wellness Visit See scanned health risk assessment for safety and other details which was reviewed today   Describes health as good, fair or great? Good   Preventive Screening -Counseling & Management  Colonoscopy completed in 11/2015 by [facility]  Was normal per his report  Smoking history/ neg for 30 pack hx Also educated regarding AAA for male tobacco users/ this was completed in 2013  RISK FACTORS Regular exercise will start working on 10000 per day Is trying to hit this every day  Diet  Smoothies Lunch salad and sandwich Dinner with balanced meal  Healthy diet  Low sodium  Fall risk no Mobility of Functional changes this year? no  Cardiac Risk Factors:  Advanced aged > 74 in men; >65 in women Hyperlipidemia discussed triglycerides/ medically manged Diabetes under control Family History (none noted )  Obesity weight 28 which is normal BMI per NIH  Eye exam jan 2018/ [facility]   Depression Screen PhQ 2: negative  Activities of Daily Living - See functional screen   Cognitive testing; Ad8 score; 0 or less than 2  MMSE deferred or completed if AD8 + 2 issues  Advanced Directives deferred    Patient Care Team: [name], MD as PCP - General URologist; Dr. Corky Sox; dr., Macario Golds Cardiologist ; Arnette Norris Mahser   Immunization History  Administered Date(s) Administered  . Influenza Split 10/08/2010, 10/14/2011  . Influenza Whole 09/22/2009  . Influenza, High Dose Seasonal PF 09/25/2015  . Influenza,inj,Quad PF,36+ Mos 10/02/2012, 09/06/2013, 09/19/2014  . Pneumococcal Conjugate-13 12/14/2013  .  Pneumococcal Polysaccharide-23 10/14/2011  . Tdap 11/08/2007  . Zoster 11/08/2007   Required Immunizations needed today  Screening test up to date or reviewed for plan of completion Health Maintenance Due  Topic Date Due  . Hepatitis C Screening  [birth year]  . FOOT EXAM  09/19/2015  . URINE MICROALBUMIN  09/19/2015   Agreed to hep c at the next blood draw  Agreed to foot exam completed today  Agreed to [facility] for micro albumin when he leaves today  Cardiac Risk Factors include: advanced age (>51men, >50 women);diabetes mellitus;dyslipidemia;male gender;hypertension     Objective:    Vitals: BP 128/68   Pulse 68   Temp 97.5 F (36.4 C)   Ht 6\' 2"  (1.88 m)   Wt 225 lb 2 oz (102.1 kg)   SpO2 97%   BMI 28.90 kg/m   Body mass index is 28.9 kg/m.  Tobacco History  Smoking Status  . Former Smoker  . Packs/day: 2.00  . Years: 10.00  . Types: Cigarettes  . Quit date: 04/19/1977  Smokeless Tobacco  . Never Used    Comment: discussed AAA but think he has had this     Counseling given: Yes   Past Medical History:  Diagnosis Date  . DIABETES MELLITUS, TYPE II 08/08/2009  . Hiatal hernia   . HYPERLIPIDEMIA 08/08/2009  . Palpitations   . URINARY INCONTINENCE 08/08/2009   Past Surgical History:  Procedure Laterality Date  . ACHILLES TENDON REPAIR  2002   rupture  . APPENDECTOMY  1976  . CARDIOVASCULAR STRESS TEST  11-02-2003   EF 57%  . TONSILLECTOMY  1961  . US ECHOCARDIOGRAPHY  02-10-2001   EF 60-65%   Family History  Problem Relation Age of Onset  . Mental illness Mother   . Diabetes Sister     type ll   History  Sexual Activity  . Sexual activity: Not on file    Outpatient Encounter Prescriptions as of 01/12/2016  Medication Sig  . alfuzosin (UROXATRAL) 10 MG 24 hr tablet Take 10 mg by mouth daily with breakfast.  . aspirin 81 MG tablet Take 81 mg by mouth daily.    . Coenzyme Q10 (CO Q 10 PO) Take 200 mg by mouth daily.  . fenofibrate 160 MG tablet TAKE 1  TABLET BY MOUTH  EVERY DAY  . metFORMIN (GLUCOPHAGE) 1000 MG tablet Take 1 tablet by mouth  twice a day with meals  . polycarbophil (FIBERCON) 625 MG tablet Take 625 mg by mouth daily. Taking 2 Tablet  . rosuvastatin (CRESTOR) 10 MG tablet Take 1 tablet (10 mg total) by mouth daily.   No facility-administered encounter medications on file as of 01/12/2016.     Activities of Daily Living In your present state of health, do you have any difficulty performing the following activities: 01/12/2016  Hearing? N  Vision? N  Difficulty concentrating or making decisions? N  Walking or climbing stairs? N  Dressing or bathing? N  Doing errands, shopping? N  Preparing Food and eating ? N  Using the Toilet? N  In the past six months, have you accidently leaked urine? Y  Do you have problems with loss of bowel control? N  Managing your Medications? N  Managing your Finances? N  Housekeeping or managing your Housekeeping? N  Some recent data might be hidden    Patient Care Team: [name], MD as PCP - General   Assessment:     Exercise Activities and Dietary recommendations Current Exercise Habits: Home exercise routine, Type of exercise: walking, Time (Minutes): 60, Frequency (Times/Week): 5, Weekly Exercise (Minutes/Week): 300, Intensity: Mild  Goals    . Exercise 150 minutes per week (moderate activity)          Walking 10000 steps per day!!!      Fall Risk Fall Risk  01/12/2016 09/25/2015 09/19/2014 06/09/2013  Falls in the past year? No No No No   Depression Screen PHQ 2/9 Scores 01/12/2016 09/25/2015 09/19/2014 06/09/2013  PHQ - 2 Score 0 0 0 0    Cognitive Function MMSE - Mini Mental State Exam 01/12/2016  Not completed: (No Data)        Immunization History  Administered Date(s) Administered  . Influenza Split 10/08/2010, 10/14/2011  . Influenza Whole 09/22/2009  . Influenza, High Dose Seasonal PF 09/25/2015  . Influenza,inj,Quad PF,36+ Mos 10/02/2012, 09/06/2013,  09/19/2014  . Pneumococcal Conjugate-13 12/14/2013  . Pneumococcal Polysaccharide-23 10/14/2011  . Tdap 11/08/2007  . Zoster 11/08/2007   Screening Tests Health Maintenance  Topic Date Due  . Hepatitis C Screening  Oct 21, 1945  . FOOT EXAM  09/19/2015  . URINE MICROALBUMIN  09/19/2015  . OPHTHALMOLOGY EXAM  01/31/2016  . HEMOGLOBIN A1C  03/24/2016  . TETANUS/TDAP  11/07/2017  . COLONOSCOPY  10/09/2020  . INFLUENZA VACCINE  Completed  . ZOSTAVAX  Completed  . PNA vac Low Risk Adult  Completed      Plan:     To have micro albumin Foot exam normal; takes good care of feet  Needs microalbumin  During the course of the visit the patient was educated and counseled about  the following appropriate screening and preventive services:   Vaccines to include Pneumoccal, Influenza, Hepatitis B, Td, Zostavax, HCV  Electrocardiogram  Cardiovascular Disease  Colorectal cancer screening  Diabetes screening  Prostate Cancer Screening  Glaucoma screening  Nutrition counseling   Smoking cessation counseling  Patient Instructions (the written plan) was given to the patient.    [id number], RN  01/12/2016   Agree with the above assessment  [name] MD [facility]

## 2016-01-12 NOTE — Patient Instructions (Signed)
Thank you for taking time to come for your Medicare Wellness Visit. I appreciate your ongoing commitment to your health goals. Please review the following plan we discussed and let me know if I can assist you in the future.   Will get microalbumin today  Eye exam was completed 01/2016  Will complete foot check today   Will discuss hep c with Dr. Elease Hashimoto  These are the goals we discussed: Goals    . Exercise 150 minutes per week (moderate activity)          Walking 10000 steps per day!!!       This is a list of the screening recommended for you and due dates:  Health Maintenance  Topic Date Due  .  Hepatitis C: One time screening is recommended by Center for Disease Control  (CDC) for  adults born from [birth year] through 1964.   07/06/1945  . Complete foot exam   09/19/2015  . Urine Protein Check  09/19/2015  . Eye exam for diabetics  01/31/2016  . Hemoglobin A1C  03/24/2016  . Tetanus Vaccine  11/07/2017  . Colon Cancer Screening  10/09/2020  . Flu Shot  Completed  . Shingles Vaccine  Completed  . Pneumonia vaccines  Completed  Fall Prevention in the Home Falls can cause injuries and can affect people from all age groups. There are many simple things that you can do to make your home safe and to help prevent falls. What can I do on the outside of my home?  Regularly repair the edges of walkways and driveways and fix any cracks.  Remove high doorway thresholds.  Trim any shrubbery on the main path into your home.  Use bright outdoor lighting.  Clear walkways of debris and clutter, including tools and rocks.  Regularly check that handrails are securely fastened and in good repair. Both sides of any steps should have handrails.  Install guardrails along the edges of any raised decks or porches.  Have leaves, snow, and ice cleared regularly.  Use sand or salt on walkways during winter months.  In the garage, clean up any spills right away, including grease or oil  spills. What can I do in the bathroom?  Use night lights.  Install grab bars by the toilet and in the tub and shower. Do not use towel bars as grab bars.  Use non-skid mats or decals on the floor of the tub or shower.  If you need to sit down while you are in the shower, use a plastic, non-slip stool.  Keep the floor dry. Immediately clean up any water that spills on the floor.  Remove soap buildup in the tub or shower on a regular basis.  Attach bath mats securely with double-sided non-slip rug tape.  Remove throw rugs and other tripping hazards from the floor. What can I do in the bedroom?  Use night lights.  Make sure that a bedside light is easy to reach.  Do not use oversized bedding that drapes onto the floor.  Have a firm chair that has side arms to use for getting dressed.  Remove throw rugs and other tripping hazards from the floor. What can I do in the kitchen?  Clean up any spills right away.  Avoid walking on wet floors.  Place frequently used items in easy-to-reach places.  If you need to reach for something above you, use a sturdy step stool that has a grab bar.  Keep electrical cables out of the  way.  Do not use floor polish or wax that makes floors slippery. If you have to use wax, make sure that it is non-skid floor wax.  Remove throw rugs and other tripping hazards from the floor. What can I do in the stairways?  Do not leave any items on the stairs.  Make sure that there are handrails on both sides of the stairs. Fix handrails that are broken or loose. Make sure that handrails are as long as the stairways.  Check any carpeting to make sure that it is firmly attached to the stairs. Fix any carpet that is loose or worn.  Avoid having throw rugs at the top or bottom of stairways, or secure the rugs with carpet tape to prevent them from moving.  Make sure that you have a light switch at the top of the stairs and the bottom of the stairs. If you do  not have them, have them installed. What are some other fall prevention tips?  Wear closed-toe shoes that fit well and support your feet. Wear shoes that have rubber soles or low heels.  When you use a stepladder, make sure that it is completely opened and that the sides are firmly locked. Have someone hold the ladder while you are using it. Do not climb a closed stepladder.  Add color or contrast paint or tape to grab bars and handrails in your home. Place contrasting color strips on the first and last steps.  Use mobility aids as needed, such as canes, walkers, scooters, and crutches.  Turn on lights if it is dark. Replace any light bulbs that burn out.  Set up furniture so that there are clear paths. Keep the furniture in the same spot.  Fix any uneven floor surfaces.  Choose a carpet design that does not hide the edge of steps of a stairway.  Be aware of any and all pets.  Review your medicines with your healthcare provider. Some medicines can cause dizziness or changes in blood pressure, which increase your risk of falling. Talk with your health care provider about other ways that you can decrease your risk of falls. This may include working with a physical therapist or trainer to improve your strength, balance, and endurance. This information is not intended to replace advice given to you by your health care provider. Make sure you discuss any questions you have with your health care provider. Document Released: 12/14/2001 Document Revised: 05/23/2015 Document Reviewed: 01/28/2014 Elsevier Interactive Patient Education  2017 [place] Maintenance, Male A healthy lifestyle and preventative care can promote health and wellness.  Maintain regular health, dental, and eye exams.  Eat a healthy diet. Foods like vegetables, fruits, whole grains, low-fat dairy products, and lean protein foods contain the nutrients you need and are low in calories. Decrease your intake of  foods high in solid fats, added sugars, and salt. Get information about a proper diet from your health care provider, if necessary.  Regular physical exercise is one of the most important things you can do for your health. Most adults should get at least 150 minutes of moderate-intensity exercise (any activity that increases your heart rate and causes you to sweat) each week. In addition, most adults need muscle-strengthening exercises on 2 or more days a week.   Maintain a healthy weight. The body mass index (BMI) is a screening tool to identify possible weight problems. It provides an estimate of body fat based on height and weight. Your health care provider  can find your BMI and can help you achieve or maintain a healthy weight. For males 20 years and older:  A BMI below 18.5 is considered underweight.  A BMI of 18.5 to 24.9 is normal.  A BMI of 25 to 29.9 is considered overweight.  A BMI of 30 and above is considered obese.  Maintain normal blood lipids and cholesterol by exercising and minimizing your intake of saturated fat. Eat a balanced diet with plenty of fruits and vegetables. Blood tests for lipids and cholesterol should begin at age 47 and be repeated every 5 years. If your lipid or cholesterol levels are high, you are over age 43, or you are at high risk for heart disease, you may need your cholesterol levels checked more frequently.Ongoing high lipid and cholesterol levels should be treated with medicines if diet and exercise are not working.  If you smoke, find out from your health care provider how to quit. If you do not use tobacco, do not start.  Lung cancer screening is recommended for adults aged 61-80 years who are at high risk for developing lung cancer because of a history of smoking. A yearly low-dose CT scan of the lungs is recommended for people who have at least a 30-pack-year history of smoking and are current smokers or have quit within the past 15 years. A pack year  of smoking is smoking an average of 1 pack of cigarettes a day for 1 year (for example, a 30-pack-year history of smoking could mean smoking 1 pack a day for 30 years or 2 packs a day for 15 years). Yearly screening should continue until the smoker has stopped smoking for at least 15 years. Yearly screening should be stopped for people who develop a health problem that would prevent them from having lung cancer treatment.  If you choose to drink alcohol, do not have more than 2 drinks per day. One drink is considered to be 12 oz (360 mL) of beer, 5 oz (150 mL) of wine, or 1.5 oz (45 mL) of liquor.  Avoid the use of street drugs. Do not share needles with anyone. Ask for help if you need support or instructions about stopping the use of drugs.  High blood pressure causes heart disease and increases the risk of stroke. High blood pressure is more likely to develop in:  People who have blood pressure in the end of the normal range (100-139/85-89 mm Hg).  People who are overweight or obese.  People who are African American.  If you are 55-54 years of age, have your blood pressure checked every 3-5 years. If you are 72 years of age or older, have your blood pressure checked every year. You should have your blood pressure measured twice-once when you are at a hospital or clinic, and once when you are not at a hospital or clinic. Record the average of the two measurements. To check your blood pressure when you are not at a hospital or clinic, you can use:  An automated blood pressure machine at a pharmacy.  A home blood pressure monitor.  If you are 61-35 years old, ask your health care provider if you should take aspirin to prevent heart disease.  Diabetes screening involves taking a blood sample to check your fasting blood sugar level. This should be done once every 3 years after age 41 if you are at a normal weight and without risk factors for diabetes. Testing should be considered at a younger age  or be  carried out more frequently if you are overweight and have at least 1 risk factor for diabetes.  Colorectal cancer can be detected and often prevented. Most routine colorectal cancer screening begins at the age of 52 and continues through age 97. However, your health care provider may recommend screening at an earlier age if you have risk factors for colon cancer. On a yearly basis, your health care provider may provide home test kits to check for hidden blood in the stool. A small camera at the end of a tube may be used to directly examine the colon (sigmoidoscopy or colonoscopy) to detect the earliest forms of colorectal cancer. Talk to your health care provider about this at age 40 when routine screening begins. A direct exam of the colon should be repeated every 5-10 years through age 68, unless early forms of precancerous polyps or small growths are found.  People who are at an increased risk for hepatitis B should be screened for this virus. You are considered at high risk for hepatitis B if:  You were born in a country where hepatitis B occurs often. Talk with your health care provider about which countries are considered high risk.  Your parents were born in a high-risk country and you have not received a shot to protect against hepatitis B (hepatitis B vaccine).  You have HIV or AIDS.  You use needles to inject street drugs.  You live with, or have sex with, someone who has hepatitis B.  You are a man who has sex with other men (MSM).  You get hemodialysis treatment.  You take certain medicines for conditions like cancer, organ transplantation, and autoimmune conditions.  Hepatitis C blood testing is recommended for all people born from [birth year] through 1964 and any individual with known risk factors for hepatitis C.  Healthy men should no longer receive prostate-specific antigen (PSA) blood tests as part of routine cancer screening. Talk to your health care provider about prostate  cancer screening.  Testicular cancer screening is not recommended for adolescents or adult males who have no symptoms. Screening includes self-exam, a health care provider exam, and other screening tests. Consult with your health care provider about any symptoms you have or any concerns you have about testicular cancer.  Practice safe sex. Use condoms and avoid high-risk sexual practices to reduce the spread of sexually transmitted infections (STIs).  You should be screened for STIs, including gonorrhea and chlamydia if:  You are sexually active and are younger than 24 years.  You are older than 24 years, and your health care provider tells you that you are at risk for this type of infection.  Your sexual activity has changed since you were last screened, and you are at an increased risk for chlamydia or gonorrhea. Ask your health care provider if you are at risk.  If you are at risk of being infected with HIV, it is recommended that you take a prescription medicine daily to prevent HIV infection. This is called pre-exposure prophylaxis (PrEP). You are considered at risk if:  You are a man who has sex with other men (MSM).  You are a heterosexual man who is sexually active with multiple partners.  You take drugs by injection.  You are sexually active with a partner who has HIV.  Talk with your health care provider about whether you are at high risk of being infected with HIV. If you choose to begin PrEP, you should first be tested for HIV. You should then  be tested every 3 months for as long as you are taking PrEP.  Use sunscreen. Apply sunscreen liberally and repeatedly throughout the day. You should seek shade when your shadow is shorter than you. Protect yourself by wearing long sleeves, pants, a wide-brimmed hat, and sunglasses year round whenever you are outdoors.  Tell your health care provider of new moles or changes in moles, especially if there is a change in shape or color.  Also, tell your health care provider if a mole is larger than the size of a pencil eraser.  A one-time screening for abdominal aortic aneurysm (AAA) and surgical repair of large AAAs by ultrasound is recommended for men aged 54-75 years who are current or former smokers.  Stay current with your vaccines (immunizations). This information is not intended to replace advice given to you by your health care provider. Make sure you discuss any questions you have with your health care provider. Document Released: 06/22/2007 Document Revised: 01/14/2014 Document Reviewed: 09/27/2014 Elsevier Interactive Patient Education  2017 [employer].

## 2016-01-12 NOTE — Progress Notes (Signed)
Subjective:     Patient ID: Todd [name], male   DOB: [birth year], 71 y.o.   MRN: [id number]  HPI Patient is here for Medicare wellness visit (saw our RN health coach for that portion) and also here for medical follow-up. He has history of hypertension, dyslipidemia, type 2 diabetes. Last A1c 7.6%. Not monitoring blood sugars regularly. No polyuria or polydipsia. Currently battling with upper respiratory infection. No fever. No specific risk factors for hepatitis C but has not been screened.  Past Medical History:  Diagnosis Date  . DIABETES MELLITUS, TYPE II 08/08/2009  . Hiatal hernia   . HYPERLIPIDEMIA 08/08/2009  . Palpitations   . URINARY INCONTINENCE 08/08/2009   Past Surgical History:  Procedure Laterality Date  . ACHILLES TENDON REPAIR  2002   rupture  . APPENDECTOMY  1976  . CARDIOVASCULAR STRESS TEST  11-02-2003   EF 57%  . TONSILLECTOMY  1961  . US ECHOCARDIOGRAPHY  02-10-2001   EF 60-65%    reports that he quit smoking about 38 years ago. His smoking use included Cigarettes. He has a 20.00 pack-year smoking history. He has never used smokeless tobacco. He reports that he drinks alcohol. He reports that he does not use drugs. family history includes Diabetes in his sister; Mental illness in his mother. Allergies  Allergen Reactions  . Actifed Cold-Sinus     rash  . Lisinopril Swelling  . Pseudoephedrine     REACTION: hives     Review of Systems  Constitutional: Negative for fatigue.  Eyes: Negative for visual disturbance.  Respiratory: Negative for cough, chest tightness and shortness of breath.   Cardiovascular: Negative for chest pain, palpitations and leg swelling.  Neurological: Negative for dizziness, syncope, weakness, light-headedness and headaches.       Objective:   Physical Exam  Constitutional: He is oriented to person, place, and time. He appears well-developed and well-nourished.  HENT:  Right Ear: External ear normal.  Left Ear: External ear normal.   Mouth/Throat: Oropharynx is clear and moist.  Eyes: Pupils are equal, round, and reactive to light.  Neck: Neck supple. No thyromegaly present.  Cardiovascular: Normal rate and regular rhythm.   Pulmonary/Chest: Effort normal and breath sounds normal. No respiratory distress. He has no wheezes. He has no rales.  Musculoskeletal: He exhibits no edema.  Neurological: He is alert and oriented to person, place, and time.       Assessment:     Type 2 diabetes. History of suboptimal control    Plan:     -Repeat hemoglobin A1c and urine microalbumin today. -If A1c still elevated consider possibility of Jardiance -routine follow up.3 months  [name] MD [facility] at [facility]

## 2016-01-13 LAB — HEPATITIS C ANTIBODY: HCV Ab: NEGATIVE

## 2016-01-16 ENCOUNTER — Other Ambulatory Visit: Payer: Self-pay

## 2016-01-16 MED ORDER — EMPAGLIFLOZIN 10 MG PO TABS: 10.0000 mg | ORAL_TABLET | Freq: Every day | ORAL | 5 refills | Status: DC

## 2016-01-17 ENCOUNTER — Other Ambulatory Visit: Payer: Self-pay

## 2016-01-23 ENCOUNTER — Other Ambulatory Visit: Payer: Self-pay | Admitting: *Deleted

## 2016-01-23 MED ORDER — ROSUVASTATIN CALCIUM 10 MG PO TABS
10.0000 mg | ORAL_TABLET | Freq: Every day | ORAL | 0 refills | Status: DC
Start: 2016-01-23 — End: 2016-03-14

## 2016-01-26 DIAGNOSIS — R350 Frequency of micturition: Secondary | ICD-10-CM | POA: Diagnosis not present

## 2016-01-26 DIAGNOSIS — N5201 Erectile dysfunction due to arterial insufficiency: Secondary | ICD-10-CM | POA: Diagnosis not present

## 2016-01-26 DIAGNOSIS — F524 Premature ejaculation: Secondary | ICD-10-CM | POA: Diagnosis not present

## 2016-01-26 DIAGNOSIS — N486 Induration penis plastica: Secondary | ICD-10-CM | POA: Diagnosis not present

## 2016-01-26 DIAGNOSIS — N401 Enlarged prostate with lower urinary tract symptoms: Secondary | ICD-10-CM | POA: Diagnosis not present

## 2016-01-30 ENCOUNTER — Encounter: Payer: Self-pay | Admitting: Family Medicine

## 2016-01-30 DIAGNOSIS — R030 Elevated blood-pressure reading, without diagnosis of hypertension: Secondary | ICD-10-CM | POA: Diagnosis not present

## 2016-01-30 DIAGNOSIS — M5416 Radiculopathy, lumbar region: Secondary | ICD-10-CM | POA: Diagnosis not present

## 2016-01-30 DIAGNOSIS — N390 Urinary tract infection, site not specified: Secondary | ICD-10-CM | POA: Diagnosis not present

## 2016-01-30 DIAGNOSIS — N481 Balanitis: Secondary | ICD-10-CM | POA: Diagnosis not present

## 2016-01-30 DIAGNOSIS — Z6829 Body mass index (BMI) 29.0-29.9, adult: Secondary | ICD-10-CM | POA: Diagnosis not present

## 2016-01-30 DIAGNOSIS — M5126 Other intervertebral disc displacement, lumbar region: Secondary | ICD-10-CM | POA: Diagnosis not present

## 2016-03-07 DIAGNOSIS — M5126 Other intervertebral disc displacement, lumbar region: Secondary | ICD-10-CM | POA: Diagnosis not present

## 2016-03-07 DIAGNOSIS — M5416 Radiculopathy, lumbar region: Secondary | ICD-10-CM | POA: Diagnosis not present

## 2016-03-12 ENCOUNTER — Other Ambulatory Visit (INDEPENDENT_AMBULATORY_CARE_PROVIDER_SITE_OTHER): Payer: Medicare Other

## 2016-03-12 DIAGNOSIS — I2510 Atherosclerotic heart disease of native coronary artery without angina pectoris: Secondary | ICD-10-CM | POA: Diagnosis not present

## 2016-03-12 DIAGNOSIS — E785 Hyperlipidemia, unspecified: Secondary | ICD-10-CM | POA: Diagnosis not present

## 2016-03-12 DIAGNOSIS — I2584 Coronary atherosclerosis due to calcified coronary lesion: Secondary | ICD-10-CM

## 2016-03-12 DIAGNOSIS — Z79899 Other long term (current) drug therapy: Secondary | ICD-10-CM

## 2016-03-12 LAB — COMPREHENSIVE METABOLIC PANEL
ALT: 26 IU/L (ref 0–44)
AST: 15 IU/L (ref 0–40)
Albumin/Globulin Ratio: 1.7 (ref 1.2–2.2)
Albumin: 4.4 g/dL (ref 3.6–4.8)
Alkaline Phosphatase: 49 IU/L (ref 39–117)
BUN/Creatinine Ratio: 16 (ref 10–24)
BUN: 16 mg/dL (ref 8–27)
Bilirubin Total: 0.4 mg/dL (ref 0.0–1.2)
CO2: 21 mmol/L (ref 18–29)
Calcium: 9.4 mg/dL (ref 8.6–10.2)
Chloride: 98 mmol/L (ref 96–106)
Creatinine, Ser: 1 mg/dL (ref 0.76–1.27)
GFR calc Af Amer: 88 mL/min/{1.73_m2} (ref >59)
GFR calc non Af Amer: 76 mL/min/{1.73_m2} (ref >59)
Globulin, Total: 2.6 g/dL (ref 1.5–4.5)
Glucose: 140 mg/dL — ABNORMAL HIGH (ref 65–99)
Potassium: 4.4 mmol/L (ref 3.5–5.2)
Sodium: 139 mmol/L (ref 134–144)
Total Protein: 7 g/dL (ref 6.0–8.5)

## 2016-03-12 LAB — LIPID PANEL
Chol/HDL Ratio: 3.1 ratio units (ref 0.0–5.0)
Cholesterol, Total: 130 mg/dL (ref 100–199)
HDL: 42 mg/dL (ref >39)
LDL Calculated: 64 mg/dL (ref 0–99)
Triglycerides: 119 mg/dL (ref 0–149)
VLDL Cholesterol Cal: 24 mg/dL (ref 5–40)

## 2016-03-12 NOTE — Addendum Note (Signed)
Addended by: Stephannie Peters on: 03/12/2016 09:49 AM   Modules accepted: Orders

## 2016-03-14 ENCOUNTER — Other Ambulatory Visit: Payer: Self-pay | Admitting: Cardiovascular Disease

## 2016-04-02 DIAGNOSIS — N481 Balanitis: Secondary | ICD-10-CM | POA: Diagnosis not present

## 2016-04-10 ENCOUNTER — Encounter: Payer: Self-pay | Admitting: Family Medicine

## 2016-04-10 ENCOUNTER — Ambulatory Visit (INDEPENDENT_AMBULATORY_CARE_PROVIDER_SITE_OTHER): Payer: Medicare Other | Admitting: Family Medicine

## 2016-04-10 VITALS — BP 120/70 | HR 62 | Temp 98.3°F | Wt 220.5 lb

## 2016-04-10 DIAGNOSIS — E119 Type 2 diabetes mellitus without complications: Secondary | ICD-10-CM | POA: Diagnosis not present

## 2016-04-10 DIAGNOSIS — I2510 Atherosclerotic heart disease of native coronary artery without angina pectoris: Secondary | ICD-10-CM

## 2016-04-10 DIAGNOSIS — I10 Essential (primary) hypertension: Secondary | ICD-10-CM

## 2016-04-10 DIAGNOSIS — I2584 Coronary atherosclerosis due to calcified coronary lesion: Secondary | ICD-10-CM | POA: Diagnosis not present

## 2016-04-10 LAB — POCT GLYCOSYLATED HEMOGLOBIN (HGB A1C): Hemoglobin A1C: 7

## 2016-04-10 NOTE — Patient Instructions (Signed)
Your A1C is improved to 7.0% Let's plan on 6 month follow up.

## 2016-04-10 NOTE — Progress Notes (Signed)
Pre visit review using our clinic review tool, if applicable. No additional management support is needed unless otherwise documented below in the visit note. 

## 2016-04-10 NOTE — Progress Notes (Signed)
Subjective:     Patient ID: Todd [name], male   DOB: 1945-05-18, 71 y.o.   MRN: [id number]  HPI Follow-up type 2 diabetes. His other medical problems include history of dyslipidemia and hypertension. He's been exercising a little bit more since last visit. A1c today is improved at 7.0%. We started Jardiance but he only took 2 doses and had some possible yeast balanitis. Currently takes metformin for his diabetes. Blood pressure well controlled. No dizziness. No chest pains. No headaches.  Past Medical History:  Diagnosis Date  . DIABETES MELLITUS, TYPE II 08/08/2009  . Hiatal hernia   . HYPERLIPIDEMIA 08/08/2009  . Palpitations   . URINARY INCONTINENCE 08/08/2009   Past Surgical History:  Procedure Laterality Date  . ACHILLES TENDON REPAIR  2002   rupture  . APPENDECTOMY  1976  . CARDIOVASCULAR STRESS TEST  11-02-2003   EF 57%  . TONSILLECTOMY  1961  . US ECHOCARDIOGRAPHY  02-10-2001   EF 60-65%    reports that he quit smoking about 39 years ago. His smoking use included Cigarettes. He has a 20.00 pack-year smoking history. He has never used smokeless tobacco. He reports that he drinks alcohol. He reports that he does not use drugs. family history includes Diabetes in his sister; Mental illness in his mother. Allergies  Allergen Reactions  . Actifed Cold-Sinus     rash  . Lisinopril Swelling  . Pseudoephedrine     REACTION: hives    Review of Systems  Constitutional: Negative for fatigue.  Eyes: Negative for visual disturbance.  Respiratory: Negative for cough, chest tightness and shortness of breath.   Cardiovascular: Negative for chest pain, palpitations and leg swelling.  Neurological: Negative for dizziness, syncope, weakness, light-headedness and headaches.       Objective:   Physical Exam  Constitutional: He is oriented to person, place, and time. He appears well-developed and well-nourished.  HENT:  Right Ear: External ear normal.  Left Ear: External ear normal.   Mouth/Throat: Oropharynx is clear and moist.  Eyes: Pupils are equal, round, and reactive to light.  Neck: Neck supple. No thyromegaly present.  Cardiovascular: Normal rate and regular rhythm.   Pulmonary/Chest: Effort normal and breath sounds normal. No respiratory distress. He has no wheezes. He has no rales.  Musculoskeletal: He exhibits no edema.  Neurological: He is alert and oriented to person, place, and time.       Assessment:     #1 type 2 diabetes-improving control  #2 hypertension stable and at goal    Plan:     -Continue current medication regimen -Routine follow-up in 6 months  [name] MD [facility] at [facility]

## 2016-05-07 DIAGNOSIS — R030 Elevated blood-pressure reading, without diagnosis of hypertension: Secondary | ICD-10-CM | POA: Diagnosis not present

## 2016-05-07 DIAGNOSIS — M5126 Other intervertebral disc displacement, lumbar region: Secondary | ICD-10-CM | POA: Diagnosis not present

## 2016-05-07 DIAGNOSIS — Z6828 Body mass index (BMI) 28.0-28.9, adult: Secondary | ICD-10-CM | POA: Diagnosis not present

## 2016-05-07 DIAGNOSIS — M5416 Radiculopathy, lumbar region: Secondary | ICD-10-CM | POA: Diagnosis not present

## 2016-05-17 ENCOUNTER — Other Ambulatory Visit: Payer: Self-pay | Admitting: Family Medicine

## 2016-08-20 ENCOUNTER — Ambulatory Visit (INDEPENDENT_AMBULATORY_CARE_PROVIDER_SITE_OTHER): Payer: Medicare Other | Admitting: Family Medicine

## 2016-08-20 VITALS — BP 120/70 | HR 69 | Temp 98.3°F | Wt 227.2 lb

## 2016-08-20 DIAGNOSIS — J069 Acute upper respiratory infection, unspecified: Secondary | ICD-10-CM

## 2016-08-20 DIAGNOSIS — B9789 Other viral agents as the cause of diseases classified elsewhere: Secondary | ICD-10-CM | POA: Diagnosis not present

## 2016-08-20 DIAGNOSIS — I2510 Atherosclerotic heart disease of native coronary artery without angina pectoris: Secondary | ICD-10-CM | POA: Diagnosis not present

## 2016-08-20 DIAGNOSIS — I2584 Coronary atherosclerosis due to calcified coronary lesion: Secondary | ICD-10-CM | POA: Diagnosis not present

## 2016-08-20 NOTE — Progress Notes (Signed)
Subjective:     Patient ID: Todd [name], male   DOB: 1945-03-17, 71 y.o.   MRN: [id number]  HPI Patient seen with about 5 day history of sinus congestion, sore throat, body aches, malaise, and dry cough. Some postnasal drip. He travels frequently for work. No specific sick exposures. No nausea or vomiting.  Past Medical History:  Diagnosis Date  . DIABETES MELLITUS, TYPE II 08/08/2009  . Hiatal hernia   . HYPERLIPIDEMIA 08/08/2009  . Palpitations   . URINARY INCONTINENCE 08/08/2009   Past Surgical History:  Procedure Laterality Date  . ACHILLES TENDON REPAIR  2002   rupture  . APPENDECTOMY  1976  . CARDIOVASCULAR STRESS TEST  11-02-2003   EF 57%  . TONSILLECTOMY  1961  . US ECHOCARDIOGRAPHY  02-10-2001   EF 60-65%    reports that he quit smoking about 39 years ago. His smoking use included Cigarettes. He has a 20.00 pack-year smoking history. He has never used smokeless tobacco. He reports that he drinks alcohol. He reports that he does not use drugs. family history includes Diabetes in his sister; Mental illness in his mother. Allergies  Allergen Reactions  . Actifed Cold-Sinus     rash  . Lisinopril Swelling  . Pseudoephedrine     REACTION: hives     Review of Systems  Constitutional: Positive for fatigue. Negative for chills and fever.  HENT: Positive for congestion and sore throat.   Respiratory: Positive for cough.        Objective:   Physical Exam  Constitutional: He appears well-developed and well-nourished.  HENT:  Right Ear: External ear normal.  Left Ear: External ear normal.  Mouth/Throat: Oropharynx is clear and moist.  Neck: Neck supple.  Cardiovascular: Normal rate.   Pulmonary/Chest: Effort normal and breath sounds normal. No respiratory distress. He has no wheezes. He has no rales.  Lymphadenopathy:    He has no cervical adenopathy.       Assessment:     Probable viral URI with cough    Plan:     -Treat symptomatically. Stay hydrated.  Follow-up promptly for any fever or worsening symptoms  [name] MD [place] [facility] at [facility]

## 2016-08-20 NOTE — Patient Instructions (Signed)

## 2016-08-21 ENCOUNTER — Ambulatory Visit: Payer: Self-pay | Admitting: Family Medicine

## 2016-08-26 ENCOUNTER — Encounter: Payer: Self-pay | Admitting: Cardiovascular Disease

## 2016-08-26 ENCOUNTER — Ambulatory Visit (INDEPENDENT_AMBULATORY_CARE_PROVIDER_SITE_OTHER): Payer: Medicare Other | Admitting: Cardiovascular Disease

## 2016-08-26 VITALS — BP 150/70 | HR 74 | Ht 74.0 in | Wt 224.0 lb

## 2016-08-26 DIAGNOSIS — I2510 Atherosclerotic heart disease of native coronary artery without angina pectoris: Secondary | ICD-10-CM | POA: Diagnosis not present

## 2016-08-26 DIAGNOSIS — I2584 Coronary atherosclerosis due to calcified coronary lesion: Secondary | ICD-10-CM

## 2016-08-26 DIAGNOSIS — E782 Mixed hyperlipidemia: Secondary | ICD-10-CM

## 2016-08-26 DIAGNOSIS — I10 Essential (primary) hypertension: Secondary | ICD-10-CM

## 2016-08-26 NOTE — Patient Instructions (Signed)
Medication Instructions:  Your physician recommends that you continue on your current medications as directed. Please refer to the Current Medication list given to you today.   Labwork: None Ordered   Testing/Procedures: None Ordered   Follow-Up: Your physician wants you to follow-up in: 6 months with Dr. [name].  You will receive a reminder letter in the mail two months in advance. If you don't receive a letter, please call our office to schedule the follow-up appointment.   If you need a refill on your cardiac medications before your next appointment, please call your pharmacy.   Thank you for choosing [facility]! Christyne Mccain, RN [phone]    

## 2016-08-26 NOTE — Progress Notes (Signed)
Cardiology Office Note   Date:  08/26/2016   ID:  [name], Todd [name] [birth year], MRN [id number]  PCP:  [name], MD  Cardiologist:   Mertie Moores, MD   Chief Complaint  Patient presents with  . Follow-up    essential hypertension   1. Hypercholesterolemia 2. Palpitations 3. History of chest pain- normal stress Myoview study in October, 2005 4. Diabetes mellitus 5. Hypertension 6. Atypical chest pain:    Todd [name] is a 71 year old gentleman with a history as noted above. He is remaining quite active. He is fairly healthy. He's been exercising on a fairly regular basis.  He denies episodes of chest pain or shortness of breath.  Feb. 6, 2014: Todd [name] is doing well. He's not had any episodes of chest pain or shortness breath. He still exercising some but not as much as he would like to. He gained a little bit of weight on a cruise this past fall and still working on getting that weight off.  April, 18, 2014:  He feels ok but has had an unusual sensation across his chest. It is a similar sensation that he gets in his arms with the pravachol. He has decreased his dose and feels better. The sensation is not related to exercise, taking a deep breath, eating or drinking. It is also not related to twisting or turning of his torso.. the sensation is an off and on sensation. to last for a couple of hours.  Nov. 3, 2014:  Todd [name] is doing well. Exercising sporadically .   Sept. 3, 2015:  Todd [name] is doing ok. Not exercising as much. 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 Has gained some weight - lack of exercise, Has not changed his diet.     May 04, 2014:   Todd [name] is a 71 y.o. male who presents for follow up of his HTN and hyperlipidemia.   He's been having some episodes of CP recently.  Worse with deep breath. More fatigued for the past couple of weeks  .  Sleeping well.  Wants to get back into an exercise  regimin.   Nov. 8, 2016:  Doing well.  Fairly active.  Walks on occasion.    Dec. 5, 2017:  [name] is doing well. Had a CT of the abdomen that mentioned possible coronary artery calcifications.  Had a low risk myoview in July, 2017. Lipids in Sept. 2016.  are ok.   Trigs = 217, LDL was 94 Has a physical coming up in a week or so   No CP.   Still very active .   Limited by his back pain .   Aug. 20, 2018:  Todd [name] is seen back today  For further evaluation of his hyperlipidemia and coronary calcifications. He had a Myoview study in July, 2017 which was low risk.  Has gotten married since I last saw him .  No CP or dyspnea. Not as much exercise as he would like Has some back issues.   Past Medical History:  Diagnosis Date  . DIABETES MELLITUS, TYPE II 08/08/2009  . Hiatal hernia   . HYPERLIPIDEMIA 08/08/2009  . Palpitations   . URINARY INCONTINENCE 08/08/2009    Past Surgical History:  Procedure Laterality Date  . ACHILLES TENDON REPAIR  2002   rupture  . APPENDECTOMY  1976  . CARDIOVASCULAR STRESS TEST  11-02-2003   EF 57%  . TONSILLECTOMY  1961  . US  ECHOCARDIOGRAPHY  02-10-2001   EF 60-65%     Current Outpatient Prescriptions  Medication Sig Dispense Refill  . alfuzosin (UROXATRAL) 10 MG 24 hr tablet Take 10 mg by mouth daily with breakfast.    . aspirin 81 MG tablet Take 81 mg by mouth daily.      . Coenzyme Q10 (CO Q 10 PO) Take 200 mg by mouth daily.    . fenofibrate 160 MG tablet TAKE 1 TABLET BY MOUTH  EVERY DAY 90 tablet 1  . metFORMIN (GLUCOPHAGE) 1000 MG tablet TAKE 1 TABLET BY MOUTH  TWICE A DAY WITH MEALS 180 tablet 1  . polycarbophil (FIBERCON) 625 MG tablet Take 625 mg by mouth daily. Taking 2 Tablet    . rosuvastatin (CRESTOR) 10 MG tablet Take 1 tablet (10 mg total) by mouth daily. 90 tablet 2   No current facility-administered medications for this visit.     Allergies:   Actifed cold-sinus; Lisinopril; and Pseudoephedrine    Social History:  The  patient  reports that he quit smoking about 39 years ago. His smoking use included Cigarettes. He has a 20.00 pack-year smoking history. He has never used smokeless tobacco. He reports that he drinks alcohol. He reports that he does not use drugs.   Family History:  The patient's family history includes Diabetes in his sister; Mental illness in his mother.    ROS:  Please see the history of present illness.    Review of Systems: Constitutional:  denies fever, chills, diaphoresis, appetite change and fatigue.  HEENT: denies photophobia, eye pain, redness, hearing loss, ear pain, congestion, sore throat, rhinorrhea, sneezing, neck pain, neck stiffness and tinnitus.  Respiratory: denies SOB, DOE, cough, chest tightness, and wheezing.  Cardiovascular: denies chest pain, palpitations and leg swelling.  Gastrointestinal: denies nausea, vomiting, abdominal pain, diarrhea, constipation, blood in stool.  Genitourinary: denies dysuria, urgency, frequency, hematuria, flank pain and difficulty urinating.  Musculoskeletal: denies  myalgias, back pain, joint swelling, arthralgias and gait problem.   Skin: denies pallor, rash and wound.  Neurological: denies dizziness, seizures, syncope, weakness, light-headedness, numbness and headaches.   Hematological: denies adenopathy, easy bruising, personal or family bleeding history.  Psychiatric/ Behavioral: denies suicidal ideation, mood changes, confusion, nervousness, sleep disturbance and agitation.       All other systems are reviewed and negative.    PHYSICAL EXAM: VS:  BP (!) 150/70   Pulse 74   Ht 6\' 2"  (1.88 m)   Wt 224 lb (101.6 kg)   BMI 28.76 kg/m  , BMI Body mass index is 28.76 kg/m. GEN: Well nourished, well developed, in no acute distress  HEENT: normal  Neck: no JVD, carotid bruits, or masses Cardiac: RRR; no murmurs, rubs, or gallops,no edema , no chest wall tenderness. Respiratory:  clear to auscultation bilaterally, normal work of  breathing GI: soft, nontender, nondistended, + BS MS: no deformity or atrophy  Skin: warm and dry, no rash Neuro:  Strength and sensation are intact Psych: normal   EKG:  EKG is ordered today. The ekg ordered today demonstrates NSR at  74.  NS ST abn  Recent Labs: 03/12/2016: ALT 26; BUN 16; Creatinine, Ser 1.00; Potassium 4.4; Sodium 139    Lipid Panel    Component Value Date/Time   CHOL 130 03/12/2016 0949   TRIG 119 03/12/2016 0949   HDL 42 03/12/2016 0949   CHOLHDL 3.1 03/12/2016 0949   CHOLHDL 4.3 12/12/2015 1015   VLDL 58 (H) 12/12/2015 1015   LDLCALC  64 03/12/2016 0949   LDLDIRECT 94.0 09/19/2014 0923      Wt Readings from Last 3 Encounters:  08/26/16 224 lb (101.6 kg)  08/20/16 227 lb 3.2 oz (103.1 kg)  04/10/16 220 lb 8 oz (100 kg)      Other studies Reviewed: Additional studies/ records that were reviewed today include: . Review of the above records demonstrates:    ASSESSMENT AND PLAN:  1. Hypercholesterolemia-     Labs are looking ok Will have them drawn at his primary MD soon  2. Palpitations 3. History of chest pain- normal stress Myoview study in July , 2017.   4. Diabetes mellitus  5. Hypertension- blood pressure is  well-controlled.  Current medicines are reviewed at length with the patient today.  The patient does not have concerns regarding medicines.  The following changes have been made:  no change  Labs/ tests ordered today include:   No orders of the defined types were placed in this encounter.    Disposition:   FU with 6 months     Signed, Mertie Moores, MD  08/26/2016 9:33 AM    [facility] [address], [place], [place]  [address] Phone: [phone]; Fax: [phone]

## 2016-09-12 DIAGNOSIS — M5126 Other intervertebral disc displacement, lumbar region: Secondary | ICD-10-CM | POA: Diagnosis not present

## 2016-09-26 ENCOUNTER — Encounter: Payer: Self-pay | Admitting: Family Medicine

## 2016-10-11 ENCOUNTER — Ambulatory Visit (INDEPENDENT_AMBULATORY_CARE_PROVIDER_SITE_OTHER): Payer: Medicare Other | Admitting: Family Medicine

## 2016-10-11 ENCOUNTER — Ambulatory Visit: Payer: Self-pay | Admitting: Family Medicine

## 2016-10-11 ENCOUNTER — Ambulatory Visit: Payer: Medicare Other | Admitting: Family Medicine

## 2016-10-11 ENCOUNTER — Encounter: Payer: Self-pay | Admitting: Family Medicine

## 2016-10-11 VITALS — BP 122/78 | HR 65 | Temp 98.1°F | Wt 224.1 lb

## 2016-10-11 DIAGNOSIS — I2584 Coronary atherosclerosis due to calcified coronary lesion: Secondary | ICD-10-CM

## 2016-10-11 DIAGNOSIS — I2510 Atherosclerotic heart disease of native coronary artery without angina pectoris: Secondary | ICD-10-CM | POA: Diagnosis not present

## 2016-10-11 DIAGNOSIS — E119 Type 2 diabetes mellitus without complications: Secondary | ICD-10-CM

## 2016-10-11 DIAGNOSIS — Z23 Encounter for immunization: Secondary | ICD-10-CM | POA: Diagnosis not present

## 2016-10-11 LAB — POCT GLYCOSYLATED HEMOGLOBIN (HGB A1C): Hemoglobin A1C: 7.4

## 2016-10-11 NOTE — Progress Notes (Signed)
Subjective:     Patient ID: Todd [name], male   DOB: Jun 09, 1945, 71 y.o.   MRN: [id number]  HPI Patient seen for follow-up type 2 diabetes. On metformin thousand grams twice daily. Has sinus dyslipidemia treated with Crestor and fenofibrate. He had lipids last spring. Does get remarried. Somewhat inconsistent with exercise. Eats out frequently. Not monitoring blood sugars regular. No polyuria or polydipsia. Last A1c 7.0%.  Past Medical History:  Diagnosis Date  . DIABETES MELLITUS, TYPE II 08/08/2009  . Hiatal hernia   . HYPERLIPIDEMIA 08/08/2009  . Palpitations   . URINARY INCONTINENCE 08/08/2009   Past Surgical History:  Procedure Laterality Date  . ACHILLES TENDON REPAIR  2002   rupture  . APPENDECTOMY  1976  . CARDIOVASCULAR STRESS TEST  11-02-2003   EF 57%  . TONSILLECTOMY  1961  . US ECHOCARDIOGRAPHY  02-10-2001   EF 60-65%    reports that he quit smoking about 39 years ago. His smoking use included Cigarettes. He has a 20.00 pack-year smoking history. He has never used smokeless tobacco. He reports that he drinks alcohol. He reports that he does not use drugs. family history includes Diabetes in his sister; Mental illness in his mother. Allergies  Allergen Reactions  . Actifed Cold-Sinus     rash  . Lisinopril Swelling  . Pseudoephedrine     REACTION: hives     Review of Systems  Constitutional: Negative for fatigue.  Eyes: Negative for visual disturbance.  Respiratory: Negative for cough, chest tightness and shortness of breath.   Cardiovascular: Negative for chest pain, palpitations and leg swelling.  Neurological: Negative for dizziness, syncope, weakness, light-headedness and headaches.       Objective:   Physical Exam  Constitutional: He is oriented to person, place, and time. He appears well-developed and well-nourished.  HENT:  Right Ear: External ear normal.  Left Ear: External ear normal.  Mouth/Throat: Oropharynx is clear and moist.  Eyes: Pupils are  equal, round, and reactive to light.  Neck: Neck supple. No thyromegaly present.  Cardiovascular: Normal rate and regular rhythm.   Pulmonary/Chest: Effort normal and breath sounds normal. No respiratory distress. He has no wheezes. He has no rales.  Musculoskeletal: He exhibits no edema.  Neurological: He is alert and oriented to person, place, and time.       Assessment:     Type 2 diabetes. A1c today 7.4%. Goal is less than 7    Plan:     -We discussed options including additional medication versus lifestyle modification he prefers given this 3 more months and recheck then -We discussed importance of regular exercise with combination of aerobic and resistance training -Offered follow up with certified diabetes educator and he declines at this point -Flu vaccine given -Recheck in 3 months and if A1c above 7 at that point consider either addition of SGLT 2 class or GLP-1-or even possibly DPP 4 inhibitor since he is not far from goal.  [name] MD [facility]

## 2016-10-11 NOTE — Patient Instructions (Signed)
Your A1C today was 7.4% Would like to see this below 7. Reduce sugars and starches and step up exercise consistency.

## 2016-10-24 DIAGNOSIS — M5416 Radiculopathy, lumbar region: Secondary | ICD-10-CM | POA: Diagnosis not present

## 2016-10-24 DIAGNOSIS — Z6829 Body mass index (BMI) 29.0-29.9, adult: Secondary | ICD-10-CM | POA: Diagnosis not present

## 2016-10-24 DIAGNOSIS — M5126 Other intervertebral disc displacement, lumbar region: Secondary | ICD-10-CM | POA: Diagnosis not present

## 2016-10-24 DIAGNOSIS — I10 Essential (primary) hypertension: Secondary | ICD-10-CM | POA: Diagnosis not present

## 2016-10-30 ENCOUNTER — Other Ambulatory Visit: Payer: Self-pay | Admitting: Family Medicine

## 2016-10-30 ENCOUNTER — Other Ambulatory Visit: Payer: Self-pay | Admitting: Cardiovascular Disease

## 2016-11-27 ENCOUNTER — Ambulatory Visit (INDEPENDENT_AMBULATORY_CARE_PROVIDER_SITE_OTHER): Payer: Medicare Other | Admitting: Cardiovascular Disease

## 2016-11-27 ENCOUNTER — Encounter: Payer: Self-pay | Admitting: Cardiovascular Disease

## 2016-11-27 VITALS — BP 142/68 | HR 76 | Ht 74.0 in | Wt 224.0 lb

## 2016-11-27 DIAGNOSIS — I10 Essential (primary) hypertension: Secondary | ICD-10-CM

## 2016-11-27 DIAGNOSIS — I2584 Coronary atherosclerosis due to calcified coronary lesion: Secondary | ICD-10-CM | POA: Diagnosis not present

## 2016-11-27 DIAGNOSIS — I2510 Atherosclerotic heart disease of native coronary artery without angina pectoris: Secondary | ICD-10-CM

## 2016-11-27 NOTE — Progress Notes (Signed)
Cardiology Office Note   Date:  11/27/2016   ID:  Todd [name], DOB Mar 10, 1945, MRN [id number]  PCP:  [name], MD  Cardiologist:   Mertie Moores, MD   Chief Complaint  Patient presents with  . Hypertension  . Hyperlipidemia   1. Hypercholesterolemia 2. Palpitations 3. History of chest pain- normal stress Myoview study in October, 2005 4. Diabetes mellitus 5. Hypertension 6. Atypical chest pain:    Todd [name] is a 71 year old gentleman with a history as noted above. He is remaining quite active. He is fairly healthy. He's been exercising on a fairly regular basis.  He denies episodes of chest pain or shortness of breath.  Feb. 6, 2014: Todd [name] is doing well. He's not had any episodes of chest pain or shortness breath. He still exercising some but not as much as he would like to. He gained a little bit of weight on a cruise this past fall and still working on getting that weight off.  April, 18, 2014:  He feels ok but has had an unusual sensation across his chest. It is a similar sensation that he gets in his arms with the pravachol. He has decreased his dose and feels better. The sensation is not related to exercise, taking a deep breath, eating or drinking. It is also not related to twisting or turning of his torso.. the sensation is an off and on sensation. to last for a couple of hours.  Nov. 3, 2014:  Todd [name] is doing well. Exercising sporadically .   Sept. 3, 2015:  Todd [name] is doing ok. Not exercising as much. 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 Has gained some weight - lack of exercise, Has not changed his diet.     May 04, 2014:   Todd [name] is a 71 y.o. male who presents for follow up of his HTN and hyperlipidemia.   He's been having some episodes of CP recently.  Worse with deep breath. More fatigued for the past couple of weeks  .  Sleeping well.  Wants to get back into an exercise regimin.    Nov. 8, 2016:  Doing well.  Fairly active.  Walks on occasion.    Dec. 5, 2017:  Todd [name] is doing well. Had a CT of the abdomen that mentioned possible coronary artery calcifications.  Had a low risk myoview in July, 2017. Lipids in Sept. 2016.  are ok.   Trigs = 217, LDL was 94 Has a physical coming up in a week or so   No CP.   Still very active .   Limited by his back pain .   Aug. 20, 2018:  Todd [name] is seen back today  For further evaluation of his hyperlipidemia and coronary calcifications. He had a Myoview study in July, 2017 which was low risk.  Has gotten married since I last saw him .  No CP or dyspnea. Not as much exercise as he would like Has some back issues.   November 27, 2016: Todd [name] is seen today with his new wife, Switalski.  Has had some heart burn recently .   Occurs spontaneously.   Not associated with exertion  No Cp or dyspnea with any exertion   Resolved with Prilosec.    Not getting enough exercise,   Still traveling    Past Medical History:  Diagnosis Date  . DIABETES MELLITUS, TYPE II 08/08/2009  . Hiatal hernia   .  HYPERLIPIDEMIA 08/08/2009  . Palpitations   . URINARY INCONTINENCE 08/08/2009    Past Surgical History:  Procedure Laterality Date  . ACHILLES TENDON REPAIR  2002   rupture  . APPENDECTOMY  1976  . CARDIOVASCULAR STRESS TEST  11-02-2003   EF 57%  . TONSILLECTOMY  1961  . US ECHOCARDIOGRAPHY  02-10-2001   EF 60-65%     Current Outpatient Medications  Medication Sig Dispense Refill  . alfuzosin (UROXATRAL) 10 MG 24 hr tablet Take 10 mg by mouth daily with breakfast.    . aspirin 81 MG tablet Take 81 mg by mouth daily.      . Coenzyme Q10 (CO Q 10 PO) Take 200 mg by mouth daily.    . fenofibrate 160 MG tablet TAKE 1 TABLET BY MOUTH  EVERY DAY 90 tablet 1  . metFORMIN (GLUCOPHAGE) 1000 MG tablet TAKE 1 TABLET BY MOUTH  TWICE A DAY WITH MEALS 180 tablet 1  . polycarbophil (FIBERCON) 625 MG tablet Take 625 mg by mouth daily. Taking 2  Tablet    . rosuvastatin (CRESTOR) 10 MG tablet TAKE 1 TABLET BY MOUTH  DAILY 90 tablet 3  . vitamin B-12 (CYANOCOBALAMIN) 1000 MCG tablet Take 1,000 mcg by mouth daily.    Marland Kitchen VITAMIN D, ERGOCALCIFEROL, PO Take 52 mcg by mouth daily.     No current facility-administered medications for this visit.     Allergies:   Actifed cold-sinus; Lisinopril; and Pseudoephedrine    Social History:  The patient  reports that he quit smoking about 39 years ago. His smoking use included cigarettes. He has a 20.00 pack-year smoking history. he has never used smokeless tobacco. He reports that he drinks alcohol. He reports that he does not use drugs.   Family History:  The patient's family history includes Diabetes in his sister; Mental illness in his mother.    ROS:  Please see the history of present illness.   As noted in current history.  All other systems are negative.  Physical Exam: Blood pressure (!) 142/68, pulse 76, height 6\' 2"  (1.88 m), weight 224 lb (101.6 kg), SpO2 96 %.  GEN:  Well nourished, well developed in no acute distress HEENT: Normal NECK: No JVD; No carotid bruits LYMPHATICS: No lymphadenopathy CARDIAC: RR, no murmurs, rubs, gallops RESPIRATORY:  Clear to auscultation without rales, wheezing or rhonchi  ABDOMEN: Soft, non-tender, non-distended MUSCULOSKELETAL:  No edema; No deformity  SKIN: Warm and dry NEUROLOGIC:  Alert and oriented x 3   EKG:    Recent Labs: 03/12/2016: ALT 26; BUN 16; Creatinine, Ser 1.00; Potassium 4.4; Sodium 139    Lipid Panel    Component Value Date/Time   CHOL 130 03/12/2016 0949   TRIG 119 03/12/2016 0949   HDL 42 03/12/2016 0949   CHOLHDL 3.1 03/12/2016 0949   CHOLHDL 4.3 12/12/2015 1015   VLDL 58 (H) 12/12/2015 1015   LDLCALC 64 03/12/2016 0949   LDLDIRECT 94.0 09/19/2014 0923      Wt Readings from Last 3 Encounters:  11/27/16 224 lb (101.6 kg)  10/11/16 224 lb 1.6 oz (101.7 kg)  08/26/16 224 lb (101.6 kg)      Other studies  Reviewed: Additional studies/ records that were reviewed today include: . Review of the above records demonstrates:    ASSESSMENT AND PLAN:  1. Hypercholesterolemia-   Lipids have been well controlled.  Will check at next visit    2. Palpitations - stable    3. History of chest pain-  4. Diabetes mellitus  5. Hypertension-   blood pressure was a little elevated when he first arrived.  After several minutes it did return back down to normal.   Current medicines are reviewed at length with the patient today.  The patient does not have concerns regarding medicines.  The following changes have been made:  no change  Labs/ tests ordered today include:   No orders of the defined types were placed in this encounter.    Disposition:   FU with 6 months     Signed, Mertie Moores, MD  11/27/2016 3:23 PM    [facility] [address], [place], [place]  [address] Phone: [phone]; Fax: [phone]

## 2016-11-27 NOTE — Patient Instructions (Signed)
Medication Instructions:  Your physician recommends that you continue on your current medications as directed. Please refer to the Current Medication list given to you today.   Labwork: None Ordered   Testing/Procedures: None Ordered   Follow-Up: Your physician wants you to follow-up in: 6 months with Dr. [name].  You will receive a reminder letter in the mail two months in advance. If you don't receive a letter, please call our office to schedule the follow-up appointment.   If you need a refill on your cardiac medications before your next appointment, please call your pharmacy.   Thank you for choosing [facility]! Salimah Martinovich, RN [phone]    

## 2017-01-10 ENCOUNTER — Ambulatory Visit (INDEPENDENT_AMBULATORY_CARE_PROVIDER_SITE_OTHER): Payer: Medicare Other | Admitting: Family Medicine

## 2017-01-10 ENCOUNTER — Encounter: Payer: Self-pay | Admitting: Family Medicine

## 2017-01-10 VITALS — BP 130/80 | HR 60 | Temp 98.3°F | Wt 228.0 lb

## 2017-01-10 DIAGNOSIS — E119 Type 2 diabetes mellitus without complications: Secondary | ICD-10-CM | POA: Diagnosis not present

## 2017-01-10 DIAGNOSIS — I10 Essential (primary) hypertension: Secondary | ICD-10-CM | POA: Diagnosis not present

## 2017-01-10 LAB — POCT GLYCOSYLATED HEMOGLOBIN (HGB A1C): Hemoglobin A1C: 7.6

## 2017-01-10 NOTE — Progress Notes (Signed)
Subjective:     Patient ID: Todd [name], male   DOB: [birth year], 72 y.o.   MRN: [id number]  HPI Patient here follow-up type 2 diabetes. Last A1c was 7.4%. He's not made any major changes lifestyle wise or with medication . Not monitoring blood sugars regularly. Denies any polyuria or polydipsia. Is due for follow-up exam  Past Medical History:  Diagnosis Date  . DIABETES MELLITUS, TYPE II 08/08/2009  . Hiatal hernia   . HYPERLIPIDEMIA 08/08/2009  . Palpitations   . URINARY INCONTINENCE 08/08/2009   Past Surgical History:  Procedure Laterality Date  . ACHILLES TENDON REPAIR  2002   rupture  . APPENDECTOMY  1976  . CARDIOVASCULAR STRESS TEST  11-02-2003   EF 57%  . TONSILLECTOMY  1961  . US ECHOCARDIOGRAPHY  02-10-2001   EF 60-65%    reports that he quit smoking about 39 years ago. His smoking use included cigarettes. He has a 20.00 pack-year smoking history. he has never used smokeless tobacco. He reports that he drinks alcohol. He reports that he does not use drugs. family history includes Diabetes in his sister; Mental illness in his mother. Allergies  Allergen Reactions  . Actifed Cold-Sinus     rash  . Lisinopril Swelling  . Pseudoephedrine     REACTION: hives     Review of Systems  Constitutional: Negative for fatigue.  Eyes: Negative for visual disturbance.  Respiratory: Negative for cough, chest tightness and shortness of breath.   Cardiovascular: Negative for chest pain, palpitations and leg swelling.  Endocrine: Negative for polydipsia and polyuria.  Neurological: Negative for dizziness, syncope, weakness, light-headedness and headaches.       Objective:   Physical Exam  Constitutional: He is oriented to person, place, and time. He appears well-developed and well-nourished.  HENT:  Right Ear: External ear normal.  Left Ear: External ear normal.  Mouth/Throat: Oropharynx is clear and moist.  Eyes: Pupils are equal, round, and reactive to light.  Neck: Neck  supple. No thyromegaly present.  Cardiovascular: Normal rate and regular rhythm.  Pulmonary/Chest: Effort normal and breath sounds normal. No respiratory distress. He has no wheezes. He has no rales.  Musculoskeletal: He exhibits no edema.  Neurological: He is alert and oriented to person, place, and time.       Assessment:     Type 2 diabetes.  A1c stable 7.6%    Plan:     -Repeat A1c= as above -Consider setting up Medicare wellness visit this year -We discussed more aggressive controlled diabetes first as observation recheck in 3 months and he prefers the latter  [name] MD [facility]

## 2017-01-29 DIAGNOSIS — N401 Enlarged prostate with lower urinary tract symptoms: Secondary | ICD-10-CM | POA: Diagnosis not present

## 2017-01-29 DIAGNOSIS — R351 Nocturia: Secondary | ICD-10-CM | POA: Diagnosis not present

## 2017-01-29 DIAGNOSIS — N5201 Erectile dysfunction due to arterial insufficiency: Secondary | ICD-10-CM | POA: Diagnosis not present

## 2017-01-29 DIAGNOSIS — N486 Induration penis plastica: Secondary | ICD-10-CM | POA: Diagnosis not present

## 2017-02-10 DIAGNOSIS — H35373 Puckering of macula, bilateral: Secondary | ICD-10-CM | POA: Diagnosis not present

## 2017-02-10 DIAGNOSIS — E119 Type 2 diabetes mellitus without complications: Secondary | ICD-10-CM | POA: Diagnosis not present

## 2017-02-10 DIAGNOSIS — H2513 Age-related nuclear cataract, bilateral: Secondary | ICD-10-CM | POA: Diagnosis not present

## 2017-02-10 LAB — HM DIABETES EYE EXAM

## 2017-02-14 ENCOUNTER — Encounter: Payer: Self-pay | Admitting: Family Medicine

## 2017-03-13 DIAGNOSIS — M5126 Other intervertebral disc displacement, lumbar region: Secondary | ICD-10-CM | POA: Diagnosis not present

## 2017-03-28 ENCOUNTER — Encounter: Payer: Self-pay | Admitting: Family Medicine

## 2017-03-28 ENCOUNTER — Ambulatory Visit (INDEPENDENT_AMBULATORY_CARE_PROVIDER_SITE_OTHER): Payer: Medicare Other | Admitting: Family Medicine

## 2017-03-28 VITALS — BP 130/80 | HR 68 | Temp 98.2°F | Wt 225.5 lb

## 2017-03-28 DIAGNOSIS — K5900 Constipation, unspecified: Secondary | ICD-10-CM

## 2017-03-28 NOTE — Progress Notes (Signed)
Subjective:     Patient ID: Todd [name], male   DOB: [birth year], 72 y.o.   MRN: [id number]  HPI Patient seen with some constipation and diffuse abdominal bloating for the past week or so. He states he has had a bowel movement about every couple days every few days. Is also having to strain some frequently. He takes Uroxatral and has been on this for several years. His discomfort is more dull and diffuse. No localizing pain. He had EGD and colonoscopy back in November 2017. No fever. No nausea or vomiting. No bloody stools. Has tried to drink more water. Might be getting inadequate fiber.  Past Medical History:  Diagnosis Date  . DIABETES MELLITUS, TYPE II 08/08/2009  . Hiatal hernia   . HYPERLIPIDEMIA 08/08/2009  . Palpitations   . URINARY INCONTINENCE 08/08/2009   Past Surgical History:  Procedure Laterality Date  . ACHILLES TENDON REPAIR  2002   rupture  . APPENDECTOMY  1976  . CARDIOVASCULAR STRESS TEST  11-02-2003   EF 57%  . TONSILLECTOMY  1961  . US ECHOCARDIOGRAPHY  02-10-2001   EF 60-65%    reports that he quit smoking about 39 years ago. His smoking use included cigarettes. He has a 20.00 pack-year smoking history. He has never used smokeless tobacco. He reports that he drinks alcohol. He reports that he does not use drugs. family history includes Diabetes in his sister; Mental illness in his mother. Allergies  Allergen Reactions  . Actifed Cold-Sinus     rash  . Lisinopril Swelling  . Pseudoephedrine     REACTION: hives     Review of Systems  Constitutional: Negative for appetite change and unexpected weight change.  Gastrointestinal: Positive for constipation. Negative for nausea and vomiting.       Objective:   Physical Exam  Constitutional: He appears well-developed and well-nourished.  Cardiovascular: Normal rate and regular rhythm.  Abdominal: Soft. Bowel sounds are normal. He exhibits no distension and no mass. There is no tenderness. There is no rebound and no  guarding.       Assessment:     Constipation.  Still having BMs every 2-3 days but having to strain.  Colonoscopy up to date.    Plan:     -Increase fluid intake -Goal of at least 25-30 g fiber per day- discussed natural sources and potential supplements. -Increase walking and activity -Consider over-the-counter medications such as MiraLAX -consider daily stool softener. -Consider check TSH if symptoms persist  [name] MD [facility]

## 2017-03-28 NOTE — Patient Instructions (Signed)
Constipation, Adult Constipation is when a person has fewer bowel movements in a week than normal, has difficulty having a bowel movement, or has stools that are dry, hard, or larger than normal. Constipation may be caused by an underlying condition. It may become worse with age if a person takes certain medicines and does not take in enough fluids. Follow these instructions at home: Eating and drinking   Eat foods that have a lot of fiber, such as fresh fruits and vegetables, whole grains, and beans.  Limit foods that are high in fat, low in fiber, or overly processed, such as french fries, hamburgers, cookies, candies, and soda.  Drink enough fluid to keep your urine clear or pale yellow. General instructions  Exercise regularly or as told by your health care provider.  Go to the restroom when you have the urge to go. Do not hold it in.  Take over-the-counter and prescription medicines only as told by your health care provider. These include any fiber supplements.  Practice pelvic floor retraining exercises, such as deep breathing while relaxing the lower abdomen and pelvic floor relaxation during bowel movements.  Watch your condition for any changes.  Keep all follow-up visits as told by your health care provider. This is important. Contact a health care provider if:  You have pain that gets worse.  You have a fever.  You do not have a bowel movement after 4 days.  You vomit.  You are not hungry.  You lose weight.  You are bleeding from the anus.  You have thin, pencil-like stools. Get help right away if:  You have a fever and your symptoms suddenly get worse.  You leak stool or have blood in your stool.  Your abdomen is bloated.  You have severe pain in your abdomen.  You feel dizzy or you faint. This information is not intended to replace advice given to you by your health care provider. Make sure you discuss any questions you have with your health care  provider. Document Released: 09/22/2003 Document Revised: 07/14/2015 Document Reviewed: 06/14/2015 Elsevier Interactive Patient Education  2018 [employer].  Drink plenty of fluids Get at least 25 grams of fiber per day Consider OTC Miralax daily as needed Could also try glycerin suppository.

## 2017-04-11 ENCOUNTER — Ambulatory Visit: Payer: Medicare Other | Admitting: Family Medicine

## 2017-04-22 ENCOUNTER — Ambulatory Visit: Payer: Medicare Other | Admitting: Family Medicine

## 2017-04-22 ENCOUNTER — Ambulatory Visit: Payer: Medicare Other

## 2017-04-29 ENCOUNTER — Ambulatory Visit (INDEPENDENT_AMBULATORY_CARE_PROVIDER_SITE_OTHER): Payer: Medicare Other | Admitting: Family Medicine

## 2017-04-29 ENCOUNTER — Encounter: Payer: Self-pay | Admitting: Family Medicine

## 2017-04-29 ENCOUNTER — Ambulatory Visit (INDEPENDENT_AMBULATORY_CARE_PROVIDER_SITE_OTHER): Payer: Medicare Other

## 2017-04-29 ENCOUNTER — Other Ambulatory Visit: Payer: Self-pay

## 2017-04-29 VITALS — BP 136/76 | HR 56 | Temp 97.8°F | Resp 15 | Ht 74.0 in | Wt 228.0 lb

## 2017-04-29 VITALS — BP 136/76 | HR 56 | Ht 74.0 in | Wt 228.0 lb

## 2017-04-29 DIAGNOSIS — E119 Type 2 diabetes mellitus without complications: Secondary | ICD-10-CM

## 2017-04-29 DIAGNOSIS — I10 Essential (primary) hypertension: Secondary | ICD-10-CM

## 2017-04-29 DIAGNOSIS — E785 Hyperlipidemia, unspecified: Secondary | ICD-10-CM | POA: Diagnosis not present

## 2017-04-29 DIAGNOSIS — Z0000 Encounter for general adult medical examination without abnormal findings: Secondary | ICD-10-CM

## 2017-04-29 LAB — COMPREHENSIVE METABOLIC PANEL
ALT: 30 U/L (ref 0–53)
AST: 17 U/L (ref 0–37)
Albumin: 4.7 g/dL (ref 3.5–5.2)
Alkaline Phosphatase: 56 U/L (ref 39–117)
BUN: 15 mg/dL (ref 6–23)
CO2: 34 mEq/L — ABNORMAL HIGH (ref 19–32)
Calcium: 10.4 mg/dL (ref 8.4–10.5)
Chloride: 98 mEq/L (ref 96–112)
Creatinine, Ser: 0.97 mg/dL (ref 0.40–1.50)
GFR: 98.23 mL/min (ref >60.00)
Glucose, Bld: 107 mg/dL — ABNORMAL HIGH (ref 70–99)
Potassium: 4.1 mEq/L (ref 3.5–5.1)
Sodium: 136 mEq/L (ref 135–145)
Total Bilirubin: 0.4 mg/dL (ref 0.2–1.2)
Total Protein: 7.6 g/dL (ref 6.0–8.3)

## 2017-04-29 LAB — MICROALBUMIN / CREATININE URINE RATIO
Creatinine,U: 82.1 mg/dL
Microalb Creat Ratio: 0.9 mg/g (ref 0.0–30.0)
Microalb, Ur: <0.7 mg/dL (ref 0.0–1.9)

## 2017-04-29 LAB — LIPID PANEL
Cholesterol: 144 mg/dL (ref 0–200)
HDL: 39.2 mg/dL
NonHDL: 105
Total CHOL/HDL Ratio: 4
Triglycerides: 242 mg/dL — ABNORMAL HIGH (ref 0.0–149.0)
VLDL: 48.4 mg/dL — ABNORMAL HIGH (ref 0.0–40.0)

## 2017-04-29 LAB — LDL CHOLESTEROL, DIRECT: Direct LDL: 81 mg/dL

## 2017-04-29 LAB — HEMOGLOBIN A1C: Hgb A1c MFr Bld: 8.5 % — ABNORMAL HIGH (ref 4.6–6.5)

## 2017-04-29 NOTE — Progress Notes (Signed)
Subjective:     Patient ID: Todd [name], male   DOB: 1945-06-05, 72 y.o.   MRN: [id number]  HPI Patient seen for medical follow-up. Constipation from last visit has improved with some increase in fiber. He has type 2 diabetes. Last A1c 7.6%. Blood sugars essentially unchanged. No consistent exercise. Remains on metformin. No other diabetes medications.  Hyperlipidemia treated with Crestor and due for follow-up labs. He also takes fenofibrate for high triglycerides. Triglycerides were stable a year ago. He gets regular eye exams. No neuropathy symptoms.  Had some recent heartburn which resolved with Prilosec OTC.   No recent chest pains. Had recent borderline elevated blood pressure cardiology but improved before discharge  Past Medical History:  Diagnosis Date  . DIABETES MELLITUS, TYPE II 08/08/2009  . Hiatal hernia   . HYPERLIPIDEMIA 08/08/2009  . Palpitations   . URINARY INCONTINENCE 08/08/2009   Past Surgical History:  Procedure Laterality Date  . ACHILLES TENDON REPAIR  2002   rupture  . APPENDECTOMY  1976  . CARDIOVASCULAR STRESS TEST  11-02-2003   EF 57%  . TONSILLECTOMY  1961  . US ECHOCARDIOGRAPHY  02-10-2001   EF 60-65%    reports that he quit smoking about 40 years ago. His smoking use included cigarettes. He has a 20.00 pack-year smoking history. He has never used smokeless tobacco. He reports that he drinks alcohol. He reports that he does not use drugs. family history includes Diabetes in his sister; Mental illness in his mother. Allergies  Allergen Reactions  . Actifed Cold-Sinus     rash  . Lisinopril Swelling  . Pseudoephedrine     REACTION: hives     Review of Systems  Constitutional: Negative for fatigue.  Eyes: Negative for visual disturbance.  Respiratory: Negative for cough, chest tightness and shortness of breath.   Cardiovascular: Negative for chest pain, palpitations and leg swelling.  Neurological: Negative for dizziness, syncope, weakness,  light-headedness and headaches.       Objective:   Physical Exam  Constitutional: He is oriented to person, place, and time. He appears well-developed and well-nourished.  HENT:  Right Ear: External ear normal.  Left Ear: External ear normal.  Mouth/Throat: Oropharynx is clear and moist.  Eyes: Pupils are equal, round, and reactive to light.  Neck: Neck supple. No thyromegaly present.  Cardiovascular: Normal rate and regular rhythm.  Pulmonary/Chest: Effort normal and breath sounds normal. No respiratory distress. He has no wheezes. He has no rales.  Musculoskeletal: He exhibits no edema.  Neurological: He is alert and oriented to person, place, and time.       Assessment:     #1 type 2 diabetes. History of fair control  #2 dyslipidemia  #3 recent GERD symptoms resolved with omeprazole     Plan:     -Check further labs with hemoglobin A1c, urine microalbumin screen, lipid panel, comprehensive metabolic panel -Consider addition of SGLT 2 medication if A1c not improving closer to goal -Establish more consistent exercise  [name] MD [facility] at [facility]

## 2017-04-29 NOTE — Progress Notes (Addendum)
Subjective:   Todd [name] is a 72 y.o. male who presents for Medicare Annual/Subsequent preventive examination.  Last OV today Seeing Dr. Elease Hashimoto today Still works as a Optometrist     Diet A1c jan 2019 7.6   Diet  Smoothies - bananas - 3 times a week  Does eat some red meat Lunch salad and sandwich Lots of fruit and chicken Does like beans  Healthy diet  Low sodium watches salt and sugar   BMI 29.3  Exercise Regular exercise will start working on 10000 per day  Will pick up the pace and ride the bike and walk Is trying to hit this every day Wife is a gardener   Health Maintenance Due  Topic Date Due  . URINE MICROALBUMIN  01/11/2017   Had CT of the abd/ pelvis Calcified plaque aorta, iliac arteries and femoral arteries with slight narrowing/ ectasia without focal aneurysm or high-grade occlusion   Diabetic eye exam 02/2017 Colonoscopy 10/2010  shingrix education provided      Objective:    Vitals: BP 136/76   Pulse (!) 56   Ht 6\' 2"  (1.88 m)   Wt 228 lb (103.4 kg)   SpO2 96%   BMI 29.27 kg/m   Body mass index is 29.27 kg/m.  Advanced Directives 04/29/2017  Does Patient Have a Medical Advance Directive? No    Tobacco Social History   Tobacco Use  Smoking Status Former Smoker  . Packs/day: 2.00  . Years: 10.00  . Pack years: 20.00  . Types: Cigarettes  . Last attempt to quit: 04/19/1977  . Years since quitting: 40.0  Smokeless Tobacco Never Used  Tobacco Comment   discussed AAA but think he has had this     Counseling given: Not Answered Comment: discussed AAA but think he has had this   Clinical Intake:      Past Medical History:  Diagnosis Date  . DIABETES MELLITUS, TYPE II 08/08/2009  . Hiatal hernia   . HYPERLIPIDEMIA 08/08/2009  . Palpitations   . URINARY INCONTINENCE 08/08/2009   Past Surgical History:  Procedure Laterality Date  . ACHILLES TENDON REPAIR  2002   rupture  . APPENDECTOMY  1976  . CARDIOVASCULAR STRESS  TEST  11-02-2003   EF 57%  . TONSILLECTOMY  1961  . US ECHOCARDIOGRAPHY  02-10-2001   EF 60-65%   Family History  Problem Relation Age of Onset  . Mental illness Mother   . Diabetes Sister        type ll   Social History   Socioeconomic History  . Marital status: Married    Spouse name: Not on file  . Number of children: Not on file  . Years of education: Not on file  . Highest education level: Not on file  Occupational History  . Not on file  Social Needs  . Financial resource strain: Not on file  . Food insecurity:    Worry: Not on file    Inability: Not on file  . Transportation needs:    Medical: Not on file    Non-medical: Not on file  Tobacco Use  . Smoking status: Former Smoker    Packs/day: 2.00    Years: 10.00    Pack years: 20.00    Types: Cigarettes    Last attempt to quit: 04/19/1977    Years since quitting: 40.0  . Smokeless tobacco: Never Used  . Tobacco comment: discussed AAA but think he has had this  Substance and Sexual  Activity  . Alcohol use: Yes    Comment:  glass of wine x 2 per week  . Drug use: No  . Sexual activity: Not on file  Lifestyle  . Physical activity:    Days per week: Not on file    Minutes per session: Not on file  . Stress: Not on file  Relationships  . Social connections:    Talks on phone: Not on file    Gets together: Not on file    Attends religious service: Not on file    Active member of club or organization: Not on file    Attends meetings of clubs or organizations: Not on file    Relationship status: Not on file  Other Topics Concern  . Not on file  Social History Narrative  . Not on file    Outpatient Encounter Medications as of 04/29/2017  Medication Sig  . alfuzosin (UROXATRAL) 10 MG 24 hr tablet Take 10 mg by mouth daily with breakfast.  . aspirin 81 MG tablet Take 81 mg by mouth daily.    . Coenzyme Q10 (CO Q 10 PO) Take 200 mg by mouth daily.  . fenofibrate 160 MG tablet TAKE 1 TABLET BY MOUTH  EVERY  DAY  . metFORMIN (GLUCOPHAGE) 1000 MG tablet TAKE 1 TABLET BY MOUTH  TWICE A DAY WITH MEALS  . polycarbophil (FIBERCON) 625 MG tablet Take 625 mg by mouth daily. Taking 2 Tablet  . rosuvastatin (CRESTOR) 10 MG tablet TAKE 1 TABLET BY MOUTH  DAILY  . vitamin B-12 (CYANOCOBALAMIN) 1000 MCG tablet Take 1,000 mcg by mouth daily.  Marland Kitchen VITAMIN D, ERGOCALCIFEROL, PO Take 52 mcg by mouth daily.   No facility-administered encounter medications on file as of 04/29/2017.     Activities of Daily Living In your present state of health, do you have any difficulty performing the following activities: 04/29/2017  Hearing? N  Vision? N  Difficulty concentrating or making decisions? N  Walking or climbing stairs? N  Dressing or bathing? N  Doing errands, shopping? N  Preparing Food and eating ? N  Using the Toilet? N  In the past six months, have you accidently leaked urine? N  Do you have problems with loss of bowel control? N  Managing your Medications? N  Managing your Finances? N  Housekeeping or managing your Housekeeping? N  Some recent data might be hidden    Patient Care Team: [name], MD as PCP - General [name], [name], MD as PCP - Cardiology (Cardiology)   Dr. Diona Fanti  - UR Assessment:   This is a routine wellness examination for Todd [name].  Exercise Activities and Dietary recommendations Current Exercise Habits: Home exercise routine, Type of exercise: walking(biking), Time (Minutes): 45, Frequency (Times/Week): 4, Weekly Exercise (Minutes/Week): 180, Intensity: Moderate  Goals    . Exercise 150 min/wk Moderate Activity     Likes to power walk and bike Wife likes to walk as well Has a 2 mile track at his home.        Fall Risk Fall Risk  04/29/2017 01/12/2016 09/25/2015 09/19/2014 06/09/2013  Falls in the past year? No No No No No     Depression Screen PHQ 2/9 Scores 04/29/2017 01/12/2016 09/25/2015 09/19/2014  PHQ - 2 Score 0 0 0 0    Cognitive Function MMSE - Mini  Mental State Exam 04/29/2017 01/12/2016  Not completed: (No Data) (No Data)     Ad8 score reviewed for issues:  Issues making decisions:  Less interest in hobbies / activities:  Repeats questions, stories (family complaining):  Trouble using ordinary gadgets (microwave, computer, phone):  Forgets the month or year:   Mismanaging finances:   Remembering appts:  Daily problems with thinking and/or memory: Ad8 score is= 0       Immunization History  Administered Date(s) Administered  . Influenza Split 10/08/2010, 10/14/2011  . Influenza Whole 09/22/2009  . Influenza, High Dose Seasonal PF 09/25/2015, 10/11/2016  . Influenza,inj,Quad PF,6+ Mos 10/02/2012, 09/06/2013, 09/19/2014  . Pneumococcal Conjugate-13 12/14/2013  . Pneumococcal Polysaccharide-23 10/14/2011  . Tdap 11/08/2007  . Zoster 11/08/2007     Screening Tests Health Maintenance  Topic Date Due  . URINE MICROALBUMIN  01/11/2017  . HEMOGLOBIN A1C  07/10/2017  . INFLUENZA VACCINE  08/07/2017  . TETANUS/TDAP  11/07/2017  . OPHTHALMOLOGY EXAM  02/10/2018  . FOOT EXAM  04/30/2018  . COLONOSCOPY  10/09/2020  . Hepatitis C Screening  Completed  . PNA vac Low Risk Adult  Completed         Plan:      PCP Notes   Health Maintenance Foot exam completed Mild edema to right ankle otherwise WNL Feet warm and dry ;   Colonoscopy due 10/2020 UR following  Td due 11/2017   Abnormal Screens  none  Referrals  none  Patient concerns; None;   Nurse Concerns; Slight ankle edema 1+ right ankle but resolves with lying down  Next PCP apt today      I have personally reviewed and noted the following in the patient's chart:   . Medical and social history . Use of alcohol, tobacco or illicit drugs  . Current medications and supplements . Functional ability and status . Nutritional status . Physical activity . Advanced directives . List of other physicians . Hospitalizations, surgeries, and  ER visits in previous 12 months . Vitals . Screenings to include cognitive, depression, and falls . Referrals and appointments  In addition, I have reviewed and discussed with patient certain preventive protocols, quality metrics, and best practice recommendations. A written personalized care plan for preventive services as well as general preventive health recommendations were provided to patient.     AYOKH,TXHFS, RN  04/29/2017  Agree with assessment and recommendations as above.  [name] MD [facility]

## 2017-04-29 NOTE — Patient Instructions (Addendum)
Mr. Todd [name] , Thank you for taking time to come for your Medicare Wellness Visit. I appreciate your ongoing commitment to your health goals. Please review the following plan we discussed and let me know if I can assist you in the future.   Shingrix is a vaccine for the prevention of Shingles in Adults 50 and older.  If you are on Medicare, the shingrix is covered under your Part D plan, so you will take both of the vaccines in the series at your pharmacy. Please check with your benefits regarding applicable copays or out of pocket expenses.  The Shingrix is given in 2 vaccines approx 8 weeks apart. You must receive the 2nd dose prior to 6 months from receipt of the first. Please have the pharmacist print out you Immunization  dates for our office records  Will try to complete AD; Given copy   Referred to [facility] for questions [facility] offers free advance directive forms, as well as assistance in completing the forms themselves. For assistance, contact the Spiritual Care Department at [phone], or the Clinical Social Work Department at [phone].     These are the goals we discussed: Goals    . Exercise 150 min/wk Moderate Activity     Likes to power walk and bike Wife likes to walk as well Has a 2 mile track at his home.        This is a list of the screening recommended for you and due dates:  Health Maintenance  Topic Date Due  . Complete foot exam   01/11/2017  . Urine Protein Check  01/11/2017  . Hemoglobin A1C  07/10/2017  . Flu Shot  08/07/2017  . Tetanus Vaccine  11/07/2017  . Eye exam for diabetics  02/10/2018  . Colon Cancer Screening  10/09/2020  .  Hepatitis C: One time screening is recommended by Center for Disease Control  (CDC) for  adults born from [birth year] through 1964.   Completed  . Pneumonia vaccines  Completed    Prevention of falls: Remove rugs or any tripping hazards in the home Use Non slip mats in bathtubs and showers Placing grab bars next to  the toilet and or shower Placing handrails on both sides of the stair way Adding extra lighting in the home.   Personal safety issues reviewed:  1. Consider starting a community watch program per [facility] 2.  Changes batteries is smoke detector and/or carbon monoxide detector  3.  If you have firearms; keep them in a safe place 4.  Wear protection when in the sun; Always wear sunscreen or a hat; It is good to have your doctor check your skin annually or review any new areas of concern 5. Driving safety; Keep in the right lane; stay 3 car lengths behind the car in front of you on the highway; look 3 times prior to pulling out; carry your cell phone everywhere you go!     Fall Prevention in the Home Falls can cause injuries. They can happen to people of all ages. There are many things you can do to make your home safe and to help prevent falls. What can I do on the outside of my home?  Regularly fix the edges of walkways and driveways and fix any cracks.  Remove anything that might make you trip as you walk through a door, such as a raised step or threshold.  Trim any bushes or trees on the path to your home.  Use bright outdoor  lighting.  Clear any walking paths of anything that might make someone trip, such as rocks or tools.  Regularly check to see if handrails are loose or broken. Make sure that both sides of any steps have handrails.  Any raised decks and porches should have guardrails on the edges.  Have any leaves, snow, or ice cleared regularly.  Use sand or salt on walking paths during winter.  Clean up any spills in your garage right away. This includes oil or grease spills. What can I do in the bathroom?  Use night lights.  Install grab bars by the toilet and in the tub and shower. Do not use towel bars as grab bars.  Use non-skid mats or decals in the tub or shower.  If you need to sit down in the shower, use a plastic, non-slip stool.  Keep the  floor dry. Clean up any water that spills on the floor as soon as it happens.  Remove soap buildup in the tub or shower regularly.  Attach bath mats securely with double-sided non-slip rug tape.  Do not have throw rugs and other things on the floor that can make you trip. What can I do in the bedroom?  Use night lights.  Make sure that you have a light by your bed that is easy to reach.  Do not use any sheets or blankets that are too big for your bed. They should not hang down onto the floor.  Have a firm chair that has side arms. You can use this for support while you get dressed.  Do not have throw rugs and other things on the floor that can make you trip. What can I do in the kitchen?  Clean up any spills right away.  Avoid walking on wet floors.  Keep items that you use a lot in easy-to-reach places.  If you need to reach something above you, use a strong step stool that has a grab bar.  Keep electrical cords out of the way.  Do not use floor polish or wax that makes floors slippery. If you must use wax, use non-skid floor wax.  Do not have throw rugs and other things on the floor that can make you trip. What can I do with my stairs?  Do not leave any items on the stairs.  Make sure that there are handrails on both sides of the stairs and use them. Fix handrails that are broken or loose. Make sure that handrails are as long as the stairways.  Check any carpeting to make sure that it is firmly attached to the stairs. Fix any carpet that is loose or worn.  Avoid having throw rugs at the top or bottom of the stairs. If you do have throw rugs, attach them to the floor with carpet tape.  Make sure that you have a light switch at the top of the stairs and the bottom of the stairs. If you do not have them, ask someone to add them for you. What else can I do to help prevent falls?  Wear shoes that: ? Do not have high heels. ? Have rubber bottoms. ? Are comfortable and fit  you well. ? Are closed at the toe. Do not wear sandals.  If you use a stepladder: ? Make sure that it is fully opened. Do not climb a closed stepladder. ? Make sure that both sides of the stepladder are locked into place. ? Ask someone to hold it for you, if possible.  Clearly mark and make sure that you can see: ? Any grab bars or handrails. ? First and last steps. ? Where the edge of each step is.  Use tools that help you move around (mobility aids) if they are needed. These include: ? Canes. ? Walkers. ? Scooters. ? Crutches.  Turn on the lights when you go into a dark area. Replace any light bulbs as soon as they burn out.  Set up your furniture so you have a clear path. Avoid moving your furniture around.  If any of your floors are uneven, fix them.  If there are any pets around you, be aware of where they are.  Review your medicines with your doctor. Some medicines can make you feel dizzy. This can increase your chance of falling. Ask your doctor what other things that you can do to help prevent falls. This information is not intended to replace advice given to you by your health care provider. Make sure you discuss any questions you have with your health care provider. Document Released: 10/20/2008 Document Revised: 06/01/2015 Document Reviewed: 01/28/2014 Elsevier Interactive Patient Education  2018 [place] Maintenance, Male A healthy lifestyle and preventive care is important for your health and wellness. Ask your health care provider about what schedule of regular examinations is right for you. What should I know about weight and diet? Eat a Healthy Diet  Eat plenty of vegetables, fruits, whole grains, low-fat dairy products, and lean protein.  Do not eat a lot of foods high in solid fats, added sugars, or salt.  Maintain a Healthy Weight Regular exercise can help you achieve or maintain a healthy weight. You should:  Do at least 150 minutes of  exercise each week. The exercise should increase your heart rate and make you sweat (moderate-intensity exercise).  Do strength-training exercises at least twice a week.  Watch Your Levels of Cholesterol and Blood Lipids  Have your blood tested for lipids and cholesterol every 5 years starting at 71 years of age. If you are at high risk for heart disease, you should start having your blood tested when you are 71 years old. You may need to have your cholesterol levels checked more often if: ? Your lipid or cholesterol levels are high. ? You are older than 71 years of age. ? You are at high risk for heart disease.  What should I know about cancer screening? Many types of cancers can be detected early and may often be prevented. Lung Cancer  You should be screened every year for lung cancer if: ? You are a current smoker who has smoked for at least 30 years. ? You are a former smoker who has quit within the past 15 years.  Talk to your health care provider about your screening options, when you should start screening, and how often you should be screened.  Colorectal Cancer  Routine colorectal cancer screening usually begins at 71 years of age and should be repeated every 5-10 years until you are 71 years old. You may need to be screened more often if early forms of precancerous polyps or small growths are found. Your health care provider may recommend screening at an earlier age if you have risk factors for colon cancer.  Your health care provider may recommend using home test kits to check for hidden blood in the stool.  A small camera at the end of a tube can be used to examine your colon (sigmoidoscopy or colonoscopy). This checks  for the earliest forms of colorectal cancer.  Prostate and Testicular Cancer  Depending on your age and overall health, your health care provider may do certain tests to screen for prostate and testicular cancer.  Talk to your health care provider about  any symptoms or concerns you have about testicular or prostate cancer.  Skin Cancer  Check your skin from head to toe regularly.  Tell your health care provider about any new moles or changes in moles, especially if: ? There is a change in a mole's size, shape, or color. ? You have a mole that is larger than a pencil eraser.  Always use sunscreen. Apply sunscreen liberally and repeat throughout the day.  Protect yourself by wearing long sleeves, pants, a wide-brimmed hat, and sunglasses when outside.  What should I know about heart disease, diabetes, and high blood pressure?  If you are 59-36 years of age, have your blood pressure checked every 3-5 years. If you are 49 years of age or older, have your blood pressure checked every year. You should have your blood pressure measured twice-once when you are at a hospital or clinic, and once when you are not at a hospital or clinic. Record the average of the two measurements. To check your blood pressure when you are not at a hospital or clinic, you can use: ? An automated blood pressure machine at a pharmacy. ? A home blood pressure monitor.  Talk to your health care provider about your target blood pressure.  If you are between 1-16 years old, ask your health care provider if you should take aspirin to prevent heart disease.  Have regular diabetes screenings by checking your fasting blood sugar level. ? If you are at a normal weight and have a low risk for diabetes, have this test once every three years after the age of 51. ? If you are overweight and have a high risk for diabetes, consider being tested at a younger age or more often.  A one-time screening for abdominal aortic aneurysm (AAA) by ultrasound is recommended for men aged 32-75 years who are current or former smokers. What should I know about preventing infection? Hepatitis B If you have a higher risk for hepatitis B, you should be screened for this virus. Talk with your  health care provider to find out if you are at risk for hepatitis B infection. Hepatitis C Blood testing is recommended for:  Everyone born from [birth year] through 1964.  Anyone with known risk factors for hepatitis C.  Sexually Transmitted Diseases (STDs)  You should be screened each year for STDs including gonorrhea and chlamydia if: ? You are sexually active and are younger than 71 years of age. ? You are older than 71 years of age and your health care provider tells you that you are at risk for this type of infection. ? Your sexual activity has changed since you were last screened and you are at an increased risk for chlamydia or gonorrhea. Ask your health care provider if you are at risk.  Talk with your health care provider about whether you are at high risk of being infected with HIV. Your health care provider may recommend a prescription medicine to help prevent HIV infection.  What else can I do?  Schedule regular health, dental, and eye exams.  Stay current with your vaccines (immunizations).  Do not use any tobacco products, such as cigarettes, chewing tobacco, and e-cigarettes. If you need help quitting, ask your health care provider.  Limit alcohol intake to no more than 2 drinks per day. One drink equals 12 ounces of beer, 5 ounces of wine, or 1 ounces of hard liquor.  Do not use street drugs.  Do not share needles.  Ask your health care provider for help if you need support or information about quitting drugs.  Tell your health care provider if you often feel depressed.  Tell your health care provider if you have ever been abused or do not feel safe at home. This information is not intended to replace advice given to you by your health care provider. Make sure you discuss any questions you have with your health care provider. Document Released: 06/22/2007 Document Revised: 08/23/2015 Document Reviewed: 09/27/2014 Elsevier Interactive Patient Education  [employer].

## 2017-04-30 ENCOUNTER — Telehealth: Payer: Self-pay | Admitting: Family Medicine

## 2017-04-30 NOTE — Telephone Encounter (Signed)
Copied from [place] [id number] [phone]. Topic: Quick Communication - Lab Results >> Apr 30, 2017  9:32 AM Dorrene German, RN wrote: Hulen Skains patient to inform them of lab results. When patient returns call, triage nurse may disclose results.  [phone] (

## 2017-05-01 ENCOUNTER — Other Ambulatory Visit: Payer: Self-pay | Admitting: Family Medicine

## 2017-05-01 MED ORDER — EMPAGLIFLOZIN 10 MG PO TABS: 10.0000 mg | ORAL_TABLET | Freq: Every day | ORAL | 1 refills | Status: DC

## 2017-05-01 NOTE — Telephone Encounter (Signed)
Pt given lab results and documented in result note.  

## 2017-05-20 ENCOUNTER — Encounter: Payer: Self-pay | Admitting: Family Medicine

## 2017-05-21 ENCOUNTER — Telehealth: Payer: Self-pay | Admitting: Family Medicine

## 2017-05-21 NOTE — Telephone Encounter (Signed)
Spoke with patient. After taking Jardiance for 3 days he developed some mild irritation near the urethral meatus. No burning with urination. No cloudy urine. No fever. No yeast balanitis type symptoms. He is very reluctant to take Jardiance this time. We'll wait till current symptoms settle down. We discussed potential alternatives to this class of medication including GLP-1 class but he would like to let current symptoms settle down first

## 2017-07-03 ENCOUNTER — Encounter: Payer: Self-pay | Admitting: Cardiovascular Disease

## 2017-07-03 ENCOUNTER — Ambulatory Visit (INDEPENDENT_AMBULATORY_CARE_PROVIDER_SITE_OTHER): Payer: Medicare Other | Admitting: Cardiovascular Disease

## 2017-07-03 VITALS — BP 128/70 | HR 71 | Ht 74.0 in | Wt 229.4 lb

## 2017-07-03 DIAGNOSIS — I10 Essential (primary) hypertension: Secondary | ICD-10-CM

## 2017-07-03 DIAGNOSIS — E782 Mixed hyperlipidemia: Secondary | ICD-10-CM | POA: Diagnosis not present

## 2017-07-03 NOTE — Progress Notes (Signed)
Cardiology Office Note   Date:  07/03/2017   ID:  Shaheen Mende III, DOB October 08, 1945, MRN [id number]  PCP:  [name], MD  Cardiologist:   Mertie Moores, MD   Chief Complaint  Patient presents with  . Hypertension  . Hyperlipidemia   1. Hypercholesterolemia 2. Palpitations 3. History of chest pain- normal stress Myoview study in October, 2005 4. Diabetes mellitus 5. Hypertension 6. Atypical chest pain:    Dequante is a 72 year old gentleman with a history as noted above. He is remaining quite active. He is fairly healthy. He's been exercising on a fairly regular basis.  He denies episodes of chest pain or shortness of breath.  Feb. 6, 2014: Adante is doing well. He's not had any episodes of chest pain or shortness breath. He still exercising some but not as much as he would like to. He gained a little bit of weight on a cruise this past fall and still working on getting that weight off.  April, 18, 2014:  He feels ok but has had an unusual sensation across his chest. It is a similar sensation that he gets in his arms with the pravachol. He has decreased his dose and feels better. The sensation is not related to exercise, taking a deep breath, eating or drinking. It is also not related to twisting or turning of his torso.. the sensation is an off and on sensation. to last for a couple of hours.  Nov. 3, 2014:  Zyron is doing well. Exercising sporadically .   Sept. 3, 2015:  Kendrell is doing ok. Not exercising as much. 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 Has gained some weight - lack of exercise, Has not changed his diet.     May 04, 2014:   Inchelium is a 73 y.o. male who presents for follow up of his HTN and hyperlipidemia.   He's been having some episodes of CP recently.  Worse with deep breath. More fatigued for the past couple of weeks  .  Sleeping well.  Wants to get back into an exercise regimin.    Nov. 8, 2016:  Doing well.  Fairly active.  Walks on occasion.    Dec. 5, 2017:  Adhrit is doing well. Had a CT of the abdomen that mentioned possible coronary artery calcifications.  Had a low risk myoview in July, 2017. Lipids in Sept. 2016.  are ok.   Trigs = 217, LDL was 94 Has a physical coming up in a week or so   No CP.   Still very active .   Limited by his back pain .   Aug. 20, 2018:  Anthonyjames is seen back today  For further evaluation of his hyperlipidemia and coronary calcifications. He had a Myoview study in July, 2017 which was low risk.  Has gotten married since I last saw him .  No CP or dyspnea. Not as much exercise as he would like Has some back issues.   November 27, 2016: Rockford is seen today with his new wife, Berling.  Has had some heart burn recently .   Occurs spontaneously.   Not associated with exertion  No Cp or dyspnea with any exertion   Resolved with Prilosec.    Not getting enough exercise,   Still traveling   July 03, 2017 :  Tushar is seen today  No CP , no dyspnea.    Still working .  Will be moving to [place]  in several weeks.   Past Medical History:  Diagnosis Date  . DIABETES MELLITUS, TYPE II 08/08/2009  . Hiatal hernia   . HYPERLIPIDEMIA 08/08/2009  . Palpitations   . URINARY INCONTINENCE 08/08/2009    Past Surgical History:  Procedure Laterality Date  . ACHILLES TENDON REPAIR  2002   rupture  . APPENDECTOMY  1976  . CARDIOVASCULAR STRESS TEST  11-02-2003   EF 57%  . TONSILLECTOMY  1961  . US ECHOCARDIOGRAPHY  02-10-2001   EF 60-65%     Current Outpatient Medications  Medication Sig Dispense Refill  . alfuzosin (UROXATRAL) 10 MG 24 hr tablet Take 10 mg by mouth daily with breakfast.    . aspirin 81 MG tablet Take 81 mg by mouth daily.      . Coenzyme Q10 (CO Q 10 PO) Take 200 mg by mouth daily.    . fenofibrate 160 MG tablet TAKE 1 TABLET BY MOUTH  EVERY DAY 90 tablet 1  . metFORMIN (GLUCOPHAGE) 1000 MG tablet TAKE 1  TABLET BY MOUTH  TWICE A DAY WITH MEALS 180 tablet 1  . polycarbophil (FIBERCON) 625 MG tablet Take 625 mg by mouth daily. Taking 2 Tablet    . rosuvastatin (CRESTOR) 10 MG tablet TAKE 1 TABLET BY MOUTH  DAILY 90 tablet 3  . vitamin B-12 (CYANOCOBALAMIN) 1000 MCG tablet Take 1,000 mcg by mouth daily.    Marland Kitchen VITAMIN D, ERGOCALCIFEROL, PO Take 52 mcg by mouth daily.     No current facility-administered medications for this visit.     Allergies:   Actifed cold-sinus; Lisinopril; and Pseudoephedrine    Social History:  The patient  reports that he quit smoking about 40 years ago. His smoking use included cigarettes. He has a 20.00 pack-year smoking history. He has never used smokeless tobacco. He reports that he drinks alcohol. He reports that he does not use drugs.   Family History:  The patient's family history includes Diabetes in his sister; Mental illness in his mother.    ROS: Noted in current history, otherwise review of systems is negative.  Physical Exam: Blood pressure 128/70, pulse 71, height 6\' 2"  (1.88 m), weight 229 lb 6.4 oz (104.1 kg), SpO2 94 %.  GEN:  Well nourished, well developed in no acute distress HEENT: Normal NECK: No JVD; No carotid bruits LYMPHATICS: No lymphadenopathy CARDIAC:  RR , occasional premature beats  RESPIRATORY:  Clear to auscultation without rales, wheezing or rhonchi  ABDOMEN: Soft, non-tender, non-distended MUSCULOSKELETAL:  No edema; No deformity  SKIN: Warm and dry NEUROLOGIC:  Alert and oriented x 3   EKG:   NSR  At 71.   PACs   Recent Labs: 04/29/2017: ALT 30; BUN 15; Creatinine, Ser 0.97; Potassium 4.1; Sodium 136    Lipid Panel    Component Value Date/Time   CHOL 144 04/29/2017 1432   CHOL 130 03/12/2016 0949   TRIG 242.0 (H) 04/29/2017 1432   HDL 39.20 04/29/2017 1432   HDL 42 03/12/2016 0949   CHOLHDL 4 04/29/2017 1432   VLDL 48.4 (H) 04/29/2017 1432   LDLCALC 64 03/12/2016 0949   LDLDIRECT 81.0 04/29/2017 1432      Wt  Readings from Last 3 Encounters:  07/03/17 229 lb 6.4 oz (104.1 kg)  04/29/17 228 lb (103.4 kg)  04/29/17 228 lb (103.4 kg)      Other studies Reviewed: Additional studies/ records that were reviewed today include: . Review of the above records demonstrates:  ASSESSMENT AND PLAN:  1. Hypercholesterolemia-   Labs from April look OK, triglyceride levels and glucose levels are elevated.  His hemoglobin A1c is 8.5.  I encouraged him to work on a better diet, exercise, and weight loss program.   2. Palpitations - stable .  He has premature atrial contractions.  These are benign.   3. History of chest pain-   no further episodes of chest pain.  4. Diabetes mellitus A1c is 8.5.  He needs to work on a better diet and exercise program.  He tried [place] but did not tolerate it.  He will continue to work with his primary medical doctor.  5. Hypertension-blood pressure levels have been well controlled.   Current medicines are reviewed at length with the patient today.  The patient does not have concerns regarding medicines.  The following changes have been made:  no change  Labs/ tests ordered today include:   Orders Placed This Encounter  Procedures  . Basic Metabolic Panel (BMET)  . Hepatic function panel  . Lipid Profile  . EKG 12-Lead     Disposition:   FU with 6 months     Signed, Mertie Moores, MD  07/03/2017 9:28 AM    [facility] [address], [place], [place]  [address] Phone: [phone]; Fax: [phone]

## 2017-07-03 NOTE — Patient Instructions (Signed)
Medication Instructions: Your physician recommends that you continue on your current medications as directed. Please refer to the Current Medication list given to you today.  Labwork: Your physician recommends that you return for lab work in: 6 MONTHS for FASTING Liver Function Panel, Lipids, and BMET  Procedures/Testing: None Ordered  Follow-Up: Your physician wants you to follow-up in: 6 MONTHS with Dr. Acie Fredrickson. You will receive a reminder letter in the mail two months in advance. If you don't receive a letter, please call our office to schedule the follow-up appointment.    If you need a refill on your cardiac medications before your next appointment, please call your pharmacy.

## 2017-07-06 ENCOUNTER — Encounter: Payer: Self-pay | Admitting: Family Medicine

## 2017-09-26 ENCOUNTER — Other Ambulatory Visit: Payer: Self-pay | Admitting: Cardiovascular Disease

## 2017-09-26 ENCOUNTER — Other Ambulatory Visit: Payer: Self-pay | Admitting: Family Medicine

## 2017-09-29 ENCOUNTER — Ambulatory Visit (INDEPENDENT_AMBULATORY_CARE_PROVIDER_SITE_OTHER): Payer: Medicare Other | Admitting: Family Medicine

## 2017-09-29 ENCOUNTER — Other Ambulatory Visit: Payer: Self-pay

## 2017-09-29 ENCOUNTER — Encounter: Payer: Self-pay | Admitting: Family Medicine

## 2017-09-29 VITALS — BP 126/74 | HR 78 | Temp 98.1°F | Ht 74.0 in | Wt 226.7 lb

## 2017-09-29 DIAGNOSIS — Z23 Encounter for immunization: Secondary | ICD-10-CM | POA: Diagnosis not present

## 2017-09-29 DIAGNOSIS — E119 Type 2 diabetes mellitus without complications: Secondary | ICD-10-CM | POA: Diagnosis not present

## 2017-09-29 DIAGNOSIS — M5416 Radiculopathy, lumbar region: Secondary | ICD-10-CM | POA: Diagnosis not present

## 2017-09-29 LAB — POCT GLYCOSYLATED HEMOGLOBIN (HGB A1C): Hemoglobin A1C: 7.3 % — AB (ref 4.0–5.6)

## 2017-09-29 NOTE — Progress Notes (Signed)
  Subjective:     Patient ID: Todd [name], male   DOB: [birth year], 72 y.o.   MRN: [id number]  HPI Follow-up type 2 diabetes. Recent poor control with A1c 8.5%. We started Jardiance but unfortunately he had some redness of the foreskin and stopped taking this. At this point he only takes metformin for his diabetes. He has made some positive dietary changes with reducing sugars and starches. Feels good overall. No polyuria or polydipsia.  Still not exercising much.  Past Medical History:  Diagnosis Date  . DIABETES MELLITUS, TYPE II 08/08/2009  . Hiatal hernia   . HYPERLIPIDEMIA 08/08/2009  . Palpitations   . URINARY INCONTINENCE 08/08/2009   Past Surgical History:  Procedure Laterality Date  . ACHILLES TENDON REPAIR  2002   rupture  . APPENDECTOMY  1976  . CARDIOVASCULAR STRESS TEST  11-02-2003   EF 57%  . TONSILLECTOMY  1961  . US ECHOCARDIOGRAPHY  02-10-2001   EF 60-65%    reports that he quit smoking about 40 years ago. His smoking use included cigarettes. He has a 20.00 pack-year smoking history. He has never used smokeless tobacco. He reports that he drinks alcohol. He reports that he does not use drugs. family history includes Diabetes in his sister; Mental illness in his mother. Allergies  Allergen Reactions  . Jardiance [Empagliflozin]     Yeast infections  . Actifed Cold-Sinus     rash  . Lisinopril Swelling  . Pseudoephedrine     REACTION: hives     Review of Systems  Constitutional: Negative for fatigue.  Eyes: Negative for visual disturbance.  Respiratory: Negative for cough, chest tightness and shortness of breath.   Cardiovascular: Negative for chest pain, palpitations and leg swelling.  Neurological: Negative for dizziness, syncope, weakness, light-headedness and headaches.       Objective:   Physical Exam  Constitutional: He appears well-developed and well-nourished.  Cardiovascular: Normal rate and regular rhythm.  Pulmonary/Chest: Effort normal and breath  sounds normal.  Musculoskeletal: He exhibits no edema.       Assessment:     Type 2 diabetes improved with A1c today 7.3%    Plan:     -flu vaccine given -Continue with metformin -We discussed options regarding diabetes management. We discussed option of lifestyle management and reassess in 3 months versus additional medication and he prefers the latter. If A1C starts climbing again, consider either addition of DPP-4 medication or GLP-1 medication  Todd Post MD [facility] at [facility]

## 2017-12-09 ENCOUNTER — Encounter: Payer: Self-pay | Admitting: Family Medicine

## 2017-12-19 ENCOUNTER — Encounter: Payer: Self-pay | Admitting: Cardiovascular Disease

## 2018-01-05 ENCOUNTER — Encounter: Payer: Self-pay | Admitting: Family Medicine

## 2018-01-14 ENCOUNTER — Other Ambulatory Visit: Payer: Self-pay

## 2018-01-14 ENCOUNTER — Encounter: Payer: Self-pay | Admitting: Cardiovascular Disease

## 2018-01-14 ENCOUNTER — Ambulatory Visit (INDEPENDENT_AMBULATORY_CARE_PROVIDER_SITE_OTHER): Payer: Medicare Other | Admitting: Cardiovascular Disease

## 2018-01-14 ENCOUNTER — Encounter: Payer: Self-pay | Admitting: Family Medicine

## 2018-01-14 ENCOUNTER — Ambulatory Visit (INDEPENDENT_AMBULATORY_CARE_PROVIDER_SITE_OTHER): Payer: Medicare Other | Admitting: Family Medicine

## 2018-01-14 VITALS — BP 120/72 | HR 52 | Ht 74.0 in | Wt 230.8 lb

## 2018-01-14 VITALS — BP 136/70 | HR 85 | Temp 98.1°F | Ht 74.0 in | Wt 231.2 lb

## 2018-01-14 DIAGNOSIS — I10 Essential (primary) hypertension: Secondary | ICD-10-CM | POA: Diagnosis not present

## 2018-01-14 DIAGNOSIS — E782 Mixed hyperlipidemia: Secondary | ICD-10-CM

## 2018-01-14 DIAGNOSIS — E119 Type 2 diabetes mellitus without complications: Secondary | ICD-10-CM | POA: Diagnosis not present

## 2018-01-14 LAB — LIPID PANEL
Chol/HDL Ratio: 3.9 ratio (ref 0.0–5.0)
Cholesterol, Total: 143 mg/dL (ref 100–199)
HDL: 37 mg/dL — ABNORMAL LOW (ref >39)
LDL Calculated: 72 mg/dL (ref 0–99)
Triglycerides: 170 mg/dL — ABNORMAL HIGH (ref 0–149)
VLDL Cholesterol Cal: 34 mg/dL (ref 5–40)

## 2018-01-14 LAB — HEPATIC FUNCTION PANEL
ALT: 28 IU/L (ref 0–44)
AST: 17 IU/L (ref 0–40)
Albumin: 4.5 g/dL (ref 3.5–4.8)
Alkaline Phosphatase: 60 IU/L (ref 39–117)
Bilirubin Total: <0.2 mg/dL (ref 0.0–1.2)
Bilirubin, Direct: 0.08 mg/dL (ref 0.00–0.40)
Total Protein: 7.2 g/dL (ref 6.0–8.5)

## 2018-01-14 LAB — BASIC METABOLIC PANEL
BUN/Creatinine Ratio: 12 (ref 10–24)
BUN: 12 mg/dL (ref 8–27)
CO2: 21 mmol/L (ref 20–29)
Calcium: 9.9 mg/dL (ref 8.6–10.2)
Chloride: 101 mmol/L (ref 96–106)
Creatinine, Ser: 1.03 mg/dL (ref 0.76–1.27)
GFR calc Af Amer: 84 mL/min/{1.73_m2} (ref >59)
GFR calc non Af Amer: 73 mL/min/{1.73_m2} (ref >59)
Glucose: 170 mg/dL — ABNORMAL HIGH (ref 65–99)
Potassium: 4.8 mmol/L (ref 3.5–5.2)
Sodium: 136 mmol/L (ref 134–144)

## 2018-01-14 LAB — POCT GLYCOSYLATED HEMOGLOBIN (HGB A1C): Hemoglobin A1C: 8.1 % — AB (ref 4.0–5.6)

## 2018-01-14 NOTE — Patient Instructions (Signed)
Lose some weight and increase exercise  Let's plan on 3 month follow up and if not better at that time we should look at additional medications.

## 2018-01-14 NOTE — Progress Notes (Signed)
Cardiology Office Note   Date:  01/14/2018   ID:  Todd [name], DOB 1945/04/23, MRN [id number]  PCP:  [name], MD  Cardiologist:   Mertie Moores, MD   Chief Complaint  Patient presents with  . Hypertension  . Hyperlipidemia   1. Hypercholesterolemia 2. Palpitations 3. History of chest pain- normal stress Myoview study in October, 2005 4. Diabetes mellitus 5. Hypertension 6. Atypical chest pain:    Todd [name] is a 73 year old gentleman with a history as noted above. He is remaining quite active. He is fairly healthy. He's been exercising on a fairly regular basis.  He denies episodes of chest pain or shortness of breath.  Feb. 6, 2014: Todd [name] is doing well. He's not had any episodes of chest pain or shortness breath. He still exercising some but not as much as he would like to. He gained a little bit of weight on a cruise this past fall and still working on getting that weight off.  April, 18, 2014:  He feels ok but has had an unusual sensation across his chest. It is a similar sensation that he gets in his arms with the pravachol. He has decreased his dose and feels better. The sensation is not related to exercise, taking a deep breath, eating or drinking. It is also not related to twisting or turning of his torso.. the sensation is an off and on sensation. to last for a couple of hours.  Nov. 3, 2014:  Todd [name] is doing well. Exercising sporadically .   Sept. 3, 2015:  Todd [name] is doing ok. Not exercising as much. 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 Has gained some weight - lack of exercise, Has not changed his diet.     May 04, 2014:   Todd [name] is a 73 y.o. male who presents for follow up of his HTN and hyperlipidemia.   He's been having some episodes of CP recently.  Worse with deep breath. More fatigued for the past couple of weeks  .  Sleeping well.  Wants to get back into an exercise regimin.    Nov. 8, 2016:  Doing well.  Fairly active.  Walks on occasion.    Dec. 5, 2017:  Todd [name] is doing well. Had a CT of the abdomen that mentioned possible coronary artery calcifications.  Had a low risk myoview in July, 2017. Lipids in Sept. 2016.  are ok.   Trigs = 217, LDL was 94 Has a physical coming up in a week or so   No CP.   Still very active .   Limited by his back pain .   Aug. 20, 2018:  Todd [name] is seen back today  For further evaluation of his hyperlipidemia and coronary calcifications. He had a Myoview study in July, 2017 which was low risk.  Has gotten married since I last saw him .  No CP or dyspnea. Not as much exercise as he would like Has some back issues.   November 27, 2016: Todd [name] is seen today with his new wife, Creveling.  Has had some heart burn recently .   Occurs spontaneously.   Not associated with exertion  No Cp or dyspnea with any exertion   Resolved with Prilosec.    Not getting enough exercise,   Still traveling   July 03, 2017 :  Todd [name] is seen today  No CP , no dyspnea.    Still working .  Will be moving to [place]  in several weeks.  January 14, 8313: Todd [name] seen today for follow-up visit.  He has a history of hypertension and hyperlipidemia. Has moved to [facility] since I last saw him. No CP or dyspnea.  Has not been exercising as much .   Past Medical History:  Diagnosis Date  . DIABETES MELLITUS, TYPE II 08/08/2009  . Hiatal hernia   . HYPERLIPIDEMIA 08/08/2009  . Palpitations   . URINARY INCONTINENCE 08/08/2009    Past Surgical History:  Procedure Laterality Date  . ACHILLES TENDON REPAIR  2002   rupture  . APPENDECTOMY  1976  . CARDIOVASCULAR STRESS TEST  11-02-2003   EF 57%  . TONSILLECTOMY  1961  . US ECHOCARDIOGRAPHY  02-10-2001   EF 60-65%     Current Outpatient Medications  Medication Sig Dispense Refill  . alfuzosin (UROXATRAL) 10 MG 24 hr tablet Take 10 mg by mouth 2 (two) times daily.    Marland Kitchen aspirin 81 MG tablet  Take 81 mg by mouth daily.      . Coenzyme Q10 (CO Q 10 PO) Take 200 mg by mouth daily.    . fenofibrate 160 MG tablet TAKE 1 TABLET BY MOUTH  EVERY DAY 90 tablet 1  . metFORMIN (GLUCOPHAGE) 1000 MG tablet TAKE 1 TABLET BY MOUTH  TWICE A DAY WITH MEALS 180 tablet 1  . polycarbophil (FIBERCON) 625 MG tablet Take 625 mg by mouth daily. Taking 2 Tablet    . rosuvastatin (CRESTOR) 10 MG tablet TAKE 1 TABLET BY MOUTH  DAILY 90 tablet 3  . vitamin B-12 (CYANOCOBALAMIN) 1000 MCG tablet Take 1,000 mcg by mouth daily.    Marland Kitchen VITAMIN D, ERGOCALCIFEROL, PO Take 52 mcg by mouth daily.     No current facility-administered medications for this visit.     Allergies:   Jardiance [empagliflozin]; Actifed cold-sinus; Lisinopril; and Pseudoephedrine    Social History:  The patient  reports that he quit smoking about 40 years ago. His smoking use included cigarettes. He has a 20.00 pack-year smoking history. He has never used smokeless tobacco. He reports current alcohol use. He reports that he does not use drugs.   Family History:  The patient's family history includes Diabetes in his sister; Mental illness in his mother.    ROS: Noted in current history, otherwise review of systems is negative.  Physical Exam: Blood pressure 120/72, pulse (!) 52, height 6\' 2"  (1.88 m), weight 230 lb 12.8 oz (104.7 kg), SpO2 92 %.  GEN:   Middle-aged gentleman.  Appears to be very healthy. HEENT: Normal NECK: No JVD; No carotid bruits LYMPHATICS: No lymphadenopathy CARDIAC: RRR   RESPIRATORY:  Clear to auscultation without rales, wheezing or rhonchi  ABDOMEN: Soft, non-tender, non-distended MUSCULOSKELETAL:  No edema; No deformity  SKIN: Warm and dry NEUROLOGIC:  Alert and oriented x 3    EKG:      Recent Labs: 04/29/2017: ALT 30; BUN 15; Creatinine, Ser 0.97; Potassium 4.1; Sodium 136    Lipid Panel    Component Value Date/Time   CHOL 144 04/29/2017 1432   CHOL 130 03/12/2016 0949   TRIG 242.0 (H)  04/29/2017 1432   HDL 39.20 04/29/2017 1432   HDL 42 03/12/2016 0949   CHOLHDL 4 04/29/2017 1432   VLDL 48.4 (H) 04/29/2017 1432   LDLCALC 64 03/12/2016 0949   LDLDIRECT 81.0 04/29/2017 1432      Wt Readings from Last 3 Encounters:  01/14/18 230 lb 12.8 oz (104.7 kg)  09/29/17 226 lb 11.2 oz (102.8 kg)  07/03/17 229 lb 6.4 oz (104.1 kg)      Other studies Reviewed: Additional studies/ records that were reviewed today include: . Review of the above records demonstrates:    ASSESSMENT AND PLAN:  1. Hypercholesterolemia-   continue fenofibrate 160 mg a day as well as rosuvastatin 10 mg a day.  We will draw labs today.  2. Palpitations -    notations have been well controlled.  3. History of chest pain-     Not had any coronary disease.  Denies chest pain.  We will DC the aspirin.  4. Diabetes mellitus Follow-up with Dr. Elease Hashimoto who we will see later this afternoon..  5. Hypertension-pressure is well controlled.   Current medicines are reviewed at length with the patient today.  The patient does not have concerns regarding medicines.  The following changes have been made:  no change  Labs/ tests ordered today include:   No orders of the defined types were placed in this encounter.    Disposition:   FU with me in 1 year    Signed, Mertie Moores, MD  01/14/2018 11:32 AM    [facility] [address], [place], [place]  [address] Phone: [phone]; Fax: [phone]

## 2018-01-14 NOTE — Patient Instructions (Signed)
Medication Instructions:  1) STOP ASPIRIN  Labwork: TODAY: BMET, LFTs, Lipids  Testing/Procedures: None  Follow-Up: Your provider wants you to follow-up in: 1 year with Dr. Acie Fredrickson. You will receive a reminder letter in the mail two months in advance. If you don't receive a letter, please call our office to schedule the follow-up appointment.    Any Other Special Instructions Will Be Listed Below (If Applicable).     If you need a refill on your cardiac medications before your next appointment, please call your pharmacy.

## 2018-01-14 NOTE — Progress Notes (Signed)
  Subjective:     Patient ID: Todd [name], male   DOB: October 19, 1945, 73 y.o.   MRN: [id number]  HPI Patient has hypertension, type 2 diabetes, dyslipidemia.  Just saw cardiologist this morning.  His aspirin was discontinued.  He has type 2 diabetes but not monitoring blood sugars regularly.  Last A1c 7.3%.  Takes metformin.  He had previous intolerance with Jardiance.  He has not tried DPP 4 inhibitor or GLP-1 medication.  He has just joined a new gym and plans to start exercising more consistently.  Past Medical History:  Diagnosis Date  . DIABETES MELLITUS, TYPE II 08/08/2009  . Hiatal hernia   . HYPERLIPIDEMIA 08/08/2009  . Palpitations   . URINARY INCONTINENCE 08/08/2009   Past Surgical History:  Procedure Laterality Date  . ACHILLES TENDON REPAIR  2002   rupture  . APPENDECTOMY  1976  . CARDIOVASCULAR STRESS TEST  11-02-2003   EF 57%  . TONSILLECTOMY  1961  . US ECHOCARDIOGRAPHY  02-10-2001   EF 60-65%    reports that he quit smoking about 40 years ago. His smoking use included cigarettes. He has a 20.00 pack-year smoking history. He has never used smokeless tobacco. He reports current alcohol use. He reports that he does not use drugs. family history includes Diabetes in his sister; Mental illness in his mother. Allergies  Allergen Reactions  . Jardiance [Empagliflozin]     Yeast infections  . Actifed Cold-Sinus     rash  . Lisinopril Swelling  . Pseudoephedrine     REACTION: hives     Review of Systems  Constitutional: Negative for fatigue.  Eyes: Negative for visual disturbance.  Respiratory: Negative for cough, chest tightness and shortness of breath.   Cardiovascular: Negative for chest pain, palpitations and leg swelling.  Endocrine: Negative for polydipsia and polyuria.  Neurological: Negative for dizziness, syncope, weakness, light-headedness and headaches.       Objective:   Physical Exam Constitutional:      Appearance: He is well-developed.  HENT:   Right Ear: External ear normal.     Left Ear: External ear normal.  Eyes:     Pupils: Pupils are equal, round, and reactive to light.  Neck:     Musculoskeletal: Neck supple.     Thyroid: No thyromegaly.  Cardiovascular:     Rate and Rhythm: Normal rate and regular rhythm.  Pulmonary:     Effort: Pulmonary effort is normal. No respiratory distress.     Breath sounds: Normal breath sounds. No wheezing or rales.  Neurological:     Mental Status: He is alert and oriented to person, place, and time.        Assessment:     Type 2 diabetes poorly controlled with A1c 8.1%    Plan:     -We discussed additional options including DPP 4 inhibitor or GLP-1 medication.  Previous intolerance with SGLT2 medication.  Currently on metformin. -He is reluctant to add medication at this point.  He is aware of long-term complication risk with poorly controlled type 2 diabetes.  He is requesting 27-month trial of increased exercise and weight loss.  If A1c not further to goal at that point consider medication options such as Januvia or Ozempic  [name] MD [facility] at [facility]

## 2018-01-28 ENCOUNTER — Telehealth: Payer: Self-pay

## 2018-01-28 DIAGNOSIS — E119 Type 2 diabetes mellitus without complications: Secondary | ICD-10-CM | POA: Diagnosis not present

## 2018-01-28 DIAGNOSIS — H2513 Age-related nuclear cataract, bilateral: Secondary | ICD-10-CM | POA: Diagnosis not present

## 2018-01-28 DIAGNOSIS — H35373 Puckering of macula, bilateral: Secondary | ICD-10-CM | POA: Diagnosis not present

## 2018-01-28 LAB — HM DIABETES EYE EXAM

## 2018-01-28 NOTE — Telephone Encounter (Addendum)
Author phoned pt. To attempt to reschedule AWV. Appointment rescheduled.

## 2018-02-12 DIAGNOSIS — M5416 Radiculopathy, lumbar region: Secondary | ICD-10-CM | POA: Diagnosis not present

## 2018-02-13 ENCOUNTER — Encounter: Payer: Self-pay | Admitting: Family Medicine

## 2018-03-24 ENCOUNTER — Other Ambulatory Visit: Payer: Self-pay | Admitting: Family Medicine

## 2018-03-25 DIAGNOSIS — R509 Fever, unspecified: Secondary | ICD-10-CM | POA: Diagnosis not present

## 2018-03-25 DIAGNOSIS — Z03818 Encounter for observation for suspected exposure to other biological agents ruled out: Secondary | ICD-10-CM | POA: Diagnosis not present

## 2018-04-06 ENCOUNTER — Encounter: Payer: Self-pay | Admitting: Family Medicine

## 2018-04-08 ENCOUNTER — Ambulatory Visit (INDEPENDENT_AMBULATORY_CARE_PROVIDER_SITE_OTHER): Payer: Medicare Other | Admitting: Family Medicine

## 2018-04-08 ENCOUNTER — Other Ambulatory Visit: Payer: Self-pay

## 2018-04-08 DIAGNOSIS — J019 Acute sinusitis, unspecified: Secondary | ICD-10-CM

## 2018-04-08 MED ORDER — AZITHROMYCIN 250 MG PO TABS: ORAL_TABLET | ORAL | 0 refills | Status: AC

## 2018-04-08 NOTE — Progress Notes (Signed)
Patient ID: Todd [name], male   DOB: [birth year], 73 y.o.   MRN: [id number]  Virtual Visit via Video Note  I connected with [facility] on 04/08/18 at 10:15 AM EDT by a video enabled telemedicine application and verified that I am speaking with the correct person using two identifiers.  Location patient: home Location provider:work or home office Persons participating in the virtual visit: patient, provider  I discussed the limitations of evaluation and management by telemedicine and the availability of in person appointments. The patient expressed understanding and agreed to proceed.   HPI: Patient is seen by video encounter for upper respiratory symptoms.  He has had 2 weeks of cough and sneezing now has some bilateral ear pressure and increasing maxillary facial pressure.  2 weeks ago he was screened by the [place] [facility] and had negative influenza and coronavirus screens.  He does have mostly dry cough.  No dyspnea.  No fever.  No recent travel.   ROS: See pertinent positives and negatives per HPI.  Past Medical History:  Diagnosis Date  . DIABETES MELLITUS, TYPE II 08/08/2009  . Hiatal hernia   . HYPERLIPIDEMIA 08/08/2009  . Palpitations   . URINARY INCONTINENCE 08/08/2009    Past Surgical History:  Procedure Laterality Date  . ACHILLES TENDON REPAIR  2002   rupture  . APPENDECTOMY  1976  . CARDIOVASCULAR STRESS TEST  11-02-2003   EF 57%  . TONSILLECTOMY  1961  . US ECHOCARDIOGRAPHY  02-10-2001   EF 60-65%    Family History  Problem Relation Age of Onset  . Mental illness Mother   . Diabetes Sister        type ll    SOCIAL HX: Non-smoker.   Current Outpatient Medications:  .  alfuzosin (UROXATRAL) 10 MG 24 hr tablet, Take 10 mg by mouth 2 (two) times daily., Disp: , Rfl:  .  azithromycin (ZITHROMAX Z-PAK) 250 MG tablet, Take 2 tablets (500 mg) on  Day 1,  followed by 1 tablet (250 mg) once daily on Days 2 through 5., Disp: 6 each, Rfl: 0 .  Coenzyme Q10 (CO Q 10 PO),  Take 200 mg by mouth daily., Disp: , Rfl:  .  fenofibrate 160 MG tablet, TAKE 1 TABLET BY MOUTH  EVERY DAY, Disp: 90 tablet, Rfl: 1 .  metFORMIN (GLUCOPHAGE) 1000 MG tablet, TAKE 1 TABLET BY MOUTH  TWICE A DAY WITH MEALS, Disp: 180 tablet, Rfl: 0 .  polycarbophil (FIBERCON) 625 MG tablet, Take 625 mg by mouth daily. Taking 2 Tablet, Disp: , Rfl:  .  rosuvastatin (CRESTOR) 10 MG tablet, TAKE 1 TABLET BY MOUTH  DAILY, Disp: 90 tablet, Rfl: 3 .  vitamin B-12 (CYANOCOBALAMIN) 1000 MCG tablet, Take 1,000 mcg by mouth daily., Disp: , Rfl:  .  VITAMIN D, ERGOCALCIFEROL, PO, Take 52 mcg by mouth daily., Disp: , Rfl:   EXAM:  VITALS per patient if applicable:  GENERAL: alert, oriented, appears well and in no acute distress  HEENT: atraumatic, conjunttiva clear, no obvious abnormalities on inspection of external nose and ears  NECK: normal movements of the head and neck  LUNGS: on inspection no signs of respiratory distress, breathing rate appears normal, no obvious gross SOB, gasping or wheezing  CV: no obvious cyanosis  MS: moves all visible extremities without noticeable abnormality  PSYCH/NEURO: pleasant and cooperative, no obvious depression or anxiety, speech and thought processing grossly intact  ASSESSMENT AND PLAN:  Discussed the following assessment and plan:  Acute respiratory illness.  Screening for influenza and Covid 19 negative.  May be all viral but he has had some persistent and somewhat progressive sinus pressure and pain -Agreed to send in Zithromax for 5 days -Plenty of fluids and rest -Continue social isolation to reduce risk of acquiring coronavirus infection     I discussed the assessment and treatment plan with the patient. The patient was provided an opportunity to ask questions and all were answered. The patient agreed with the plan and demonstrated an understanding of the instructions.   The patient was advised to call back or seek an in-person evaluation if  the symptoms worsen or if the condition fails to improve as anticipated.   Carolann Littler, MD

## 2018-04-14 ENCOUNTER — Encounter: Payer: Self-pay | Admitting: Family Medicine

## 2018-04-15 ENCOUNTER — Other Ambulatory Visit: Payer: Self-pay

## 2018-04-15 ENCOUNTER — Ambulatory Visit (INDEPENDENT_AMBULATORY_CARE_PROVIDER_SITE_OTHER): Payer: Medicare Other | Admitting: Family Medicine

## 2018-04-15 DIAGNOSIS — J019 Acute sinusitis, unspecified: Secondary | ICD-10-CM | POA: Diagnosis not present

## 2018-04-15 MED ORDER — AMOXICILLIN-POT CLAVULANATE 875-125 MG PO TABS: 1.0000 | ORAL_TABLET | Freq: Two times a day (BID) | ORAL | 0 refills | Status: DC

## 2018-04-15 NOTE — Progress Notes (Signed)
Patient ID: Todd [name], male   DOB: 1945-08-12, 73 y.o.   MRN: [id number]  Virtual Visit via Video Note  I connected with [facility] on 04/15/18 at  8:45 AM EDT by a video enabled telemedicine application and verified that I am speaking with the correct person using two identifiers.  Location patient: home Location provider:work or home office Persons participating in the virtual visit: patient, provider  I discussed the limitations of evaluation and management by telemedicine and the availability of in person appointments. The patient expressed understanding and agreed to proceed.   HPI: Patient called about a week ago with sinusitis type symptoms.  He was treated with Zithromax.  He felt better for several days but now is developed some dull constant mild discomfort left chest area.  No exertional chest pain.  Occasional cough.  No fever.  He has had recurrent sore throat and some bilateral ear pressure and recurrent sinus congestion.  Occasional headaches.  Denies any chills.  Recent coronavirus and influenza screening was negative.   ROS: See pertinent positives and negatives per HPI.  Past Medical History:  Diagnosis Date  . DIABETES MELLITUS, TYPE II 08/08/2009  . Hiatal hernia   . HYPERLIPIDEMIA 08/08/2009  . Palpitations   . URINARY INCONTINENCE 08/08/2009    Past Surgical History:  Procedure Laterality Date  . ACHILLES TENDON REPAIR  2002   rupture  . APPENDECTOMY  1976  . CARDIOVASCULAR STRESS TEST  11-02-2003   EF 57%  . TONSILLECTOMY  1961  . US ECHOCARDIOGRAPHY  02-10-2001   EF 60-65%    Family History  Problem Relation Age of Onset  . Mental illness Mother   . Diabetes Sister        type ll    SOCIAL HX: Non-smoker   Current Outpatient Medications:  .  alfuzosin (UROXATRAL) 10 MG 24 hr tablet, Take 10 mg by mouth 2 (two) times daily., Disp: , Rfl:  .  amoxicillin-clavulanate (AUGMENTIN) 875-125 MG tablet, Take 1 tablet by mouth 2 (two) times daily., Disp: 20  tablet, Rfl: 0 .  Coenzyme Q10 (CO Q 10 PO), Take 200 mg by mouth daily., Disp: , Rfl:  .  fenofibrate 160 MG tablet, TAKE 1 TABLET BY MOUTH  EVERY DAY, Disp: 90 tablet, Rfl: 1 .  metFORMIN (GLUCOPHAGE) 1000 MG tablet, TAKE 1 TABLET BY MOUTH  TWICE A DAY WITH MEALS, Disp: 180 tablet, Rfl: 0 .  polycarbophil (FIBERCON) 625 MG tablet, Take 625 mg by mouth daily. Taking 2 Tablet, Disp: , Rfl:  .  rosuvastatin (CRESTOR) 10 MG tablet, TAKE 1 TABLET BY MOUTH  DAILY, Disp: 90 tablet, Rfl: 3 .  vitamin B-12 (CYANOCOBALAMIN) 1000 MCG tablet, Take 1,000 mcg by mouth daily., Disp: , Rfl:  .  VITAMIN D, ERGOCALCIFEROL, PO, Take 52 mcg by mouth daily., Disp: , Rfl:   EXAM:  VITALS per patient if applicable:  GENERAL: alert, oriented, appears well and in no acute distress  HEENT: atraumatic, conjunttiva clear, no obvious abnormalities on inspection of external nose and ears  NECK: normal movements of the head and neck  LUNGS: on inspection no signs of respiratory distress, breathing rate appears normal, no obvious gross SOB, gasping or wheezing  CV: no obvious cyanosis  MS: moves all visible extremities without noticeable abnormality  PSYCH/NEURO: pleasant and cooperative, no obvious depression or anxiety, speech and thought processing grossly intact  ASSESSMENT AND PLAN:  Discussed the following assessment and plan:  Probable persistent sinusitis -We will broaden  coverage for possible anaerobes with Augmentin 875 mg twice daily for 10 days -Continue good hydration -Follow-up promptly for any fever, increased shortness of breath, or other concerns -Consider over-the-counter probiotic     I discussed the assessment and treatment plan with the patient. The patient was provided an opportunity to ask questions and all were answered. The patient agreed with the plan and demonstrated an understanding of the instructions.   The patient was advised to call back or seek an in-person evaluation if  the symptoms worsen or if the condition fails to improve as anticipated.  Carolann Littler, MD

## 2018-04-16 ENCOUNTER — Encounter: Payer: Self-pay | Admitting: Family Medicine

## 2018-05-01 ENCOUNTER — Ambulatory Visit: Payer: Medicare Other

## 2018-05-14 DIAGNOSIS — M5416 Radiculopathy, lumbar region: Secondary | ICD-10-CM | POA: Diagnosis not present

## 2018-05-26 ENCOUNTER — Other Ambulatory Visit: Payer: Self-pay | Admitting: Family Medicine

## 2018-05-26 NOTE — Telephone Encounter (Signed)
Patient has a Doxy appointment tomorrow

## 2018-05-27 ENCOUNTER — Other Ambulatory Visit: Payer: Self-pay

## 2018-05-27 ENCOUNTER — Ambulatory Visit (INDEPENDENT_AMBULATORY_CARE_PROVIDER_SITE_OTHER): Payer: Medicare Other | Admitting: Family Medicine

## 2018-05-27 DIAGNOSIS — E119 Type 2 diabetes mellitus without complications: Secondary | ICD-10-CM

## 2018-05-27 NOTE — Progress Notes (Signed)
Patient ID: Todd [name], male   DOB: 1945/07/18, 73 y.o.   MRN: [id number]  This visit type was conducted due to national recommendations for restrictions regarding the COVID-19 pandemic in an effort to limit this patient's exposure and mitigate transmission in our community.   Virtual Visit via Video Note  I connected with Cross Bess on 05/27/18 at  8:30 AM EDT by a video enabled telemedicine application and verified that I am speaking with the correct person using two identifiers.  Location patient: home Location provider:work or home office Persons participating in the virtual visit: patient, provider  I discussed the limitations of evaluation and management by telemedicine and the availability of in person appointments. The patient expressed understanding and agreed to proceed.   HPI:  Patient has type 2 diabetes and hypertension.  Last A1c 8.1%.  He takes metformin.  He has been intolerance of Jardiance in the past.  He had requested giving of another few months back in January to try to tighten up his A1c.  Not monitoring blood sugars regularly.  He had recent sinusitis which improved following Augmentin.  He had cough back in March and coronavirus screen negative.  He remains on Crestor for hyperlipidemia and also takes fenofibrate.  Recent lipids stable   ROS: See pertinent positives and negatives per HPI.  Past Medical History:  Diagnosis Date  . DIABETES MELLITUS, TYPE II 08/08/2009  . Hiatal hernia   . HYPERLIPIDEMIA 08/08/2009  . Palpitations   . URINARY INCONTINENCE 08/08/2009    Past Surgical History:  Procedure Laterality Date  . ACHILLES TENDON REPAIR  2002   rupture  . APPENDECTOMY  1976  . CARDIOVASCULAR STRESS TEST  11-02-2003   EF 57%  . TONSILLECTOMY  1961  . US ECHOCARDIOGRAPHY  02-10-2001   EF 60-65%    Family History  Problem Relation Age of Onset  . Mental illness Mother   . Diabetes Sister        type ll    SOCIAL HX: Non-smoker.  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  alfuzosin (UROXATRAL) 10 MG 24 hr tablet, Take 10 mg by mouth 2 (two) times daily., Disp: , Rfl:  .  amoxicillin-clavulanate (AUGMENTIN) 875-125 MG tablet, Take 1 tablet by mouth 2 (two) times daily., Disp: 20 tablet, Rfl: 0 .  Coenzyme Q10 (CO Q 10 PO), Take 200 mg by mouth daily., Disp: , Rfl:  .  fenofibrate 160 MG tablet, TAKE 1 TABLET BY MOUTH  EVERY DAY, Disp: 90 tablet, Rfl: 1 .  metFORMIN (GLUCOPHAGE) 1000 MG tablet, TAKE 1 TABLET BY MOUTH  TWICE A DAY WITH MEALS, Disp: 180 tablet, Rfl: 0 .  polycarbophil (FIBERCON) 625 MG tablet, Take 625 mg by mouth daily. Taking 2 Tablet, Disp: , Rfl:  .  rosuvastatin (CRESTOR) 10 MG tablet, TAKE 1 TABLET BY MOUTH  DAILY, Disp: 90 tablet, Rfl: 3 .  vitamin B-12 (CYANOCOBALAMIN) 1000 MCG tablet, Take 1,000 mcg by mouth daily., Disp: , Rfl:  .  VITAMIN D, ERGOCALCIFEROL, PO, Take 52 mcg by mouth daily., Disp: , Rfl:   EXAM:  VITALS per patient if applicable:  GENERAL: alert, oriented, appears well and in no acute distress  HEENT: atraumatic, conjunttiva clear, no obvious abnormalities on inspection of external nose and ears  NECK: normal movements of the head and neck  LUNGS: on inspection no signs of respiratory distress, breathing rate  appears normal, no obvious gross SOB, gasping or wheezing  CV: no obvious cyanosis  MS: moves all visible extremities without noticeable abnormality  PSYCH/NEURO: pleasant and cooperative, no obvious depression or anxiety, speech and thought processing grossly intact  ASSESSMENT AND PLAN:  Discussed the following assessment and plan:  Controlled type 2 diabetes mellitus without complication, without long-term current use of insulin ([place]) - Plan: Hemoglobin A1c   -Future lab for June 4 for A1c.  If A1c not further to goal at that point consider other options such as DPP 4 inhibitor or GLP-1  medication.  Previous intolerance with Jardiance.     I discussed the assessment and treatment plan with the patient. The patient was provided an opportunity to ask questions and all were answered. The patient agreed with the plan and demonstrated an understanding of the instructions.   The patient was advised to call back or seek an in-person evaluation if the symptoms worsen or if the condition fails to improve as anticipated.   Carolann Littler, MD

## 2018-06-04 ENCOUNTER — Other Ambulatory Visit: Payer: Self-pay

## 2018-06-11 ENCOUNTER — Other Ambulatory Visit (INDEPENDENT_AMBULATORY_CARE_PROVIDER_SITE_OTHER): Payer: Medicare Other

## 2018-06-11 ENCOUNTER — Other Ambulatory Visit: Payer: Self-pay

## 2018-06-11 DIAGNOSIS — I10 Essential (primary) hypertension: Secondary | ICD-10-CM | POA: Diagnosis not present

## 2018-06-11 DIAGNOSIS — E119 Type 2 diabetes mellitus without complications: Secondary | ICD-10-CM | POA: Diagnosis not present

## 2018-06-11 DIAGNOSIS — E782 Mixed hyperlipidemia: Secondary | ICD-10-CM | POA: Diagnosis not present

## 2018-06-11 LAB — BASIC METABOLIC PANEL
BUN: 16 mg/dL (ref 6–23)
CO2: 28 mEq/L (ref 19–32)
Calcium: 10 mg/dL (ref 8.4–10.5)
Chloride: 101 mEq/L (ref 96–112)
Creatinine, Ser: 1.01 mg/dL (ref 0.40–1.50)
GFR: 87.93 mL/min (ref >60.00)
Glucose, Bld: 176 mg/dL — ABNORMAL HIGH (ref 70–99)
Potassium: 4.5 mEq/L (ref 3.5–5.1)
Sodium: 138 mEq/L (ref 135–145)

## 2018-06-11 LAB — HEPATIC FUNCTION PANEL
ALT: 31 U/L (ref 0–53)
AST: 15 U/L (ref 0–37)
Albumin: 4.5 g/dL (ref 3.5–5.2)
Alkaline Phosphatase: 53 U/L (ref 39–117)
Bilirubin, Direct: 0.1 mg/dL (ref 0.0–0.3)
Total Bilirubin: 0.3 mg/dL (ref 0.2–1.2)
Total Protein: 7.5 g/dL (ref 6.0–8.3)

## 2018-06-11 LAB — LDL CHOLESTEROL, DIRECT: Direct LDL: 84 mg/dL

## 2018-06-11 LAB — LIPID PANEL
Cholesterol: 148 mg/dL (ref 0–200)
HDL: 40.8 mg/dL (ref >39.00)
NonHDL: 106.95
Total CHOL/HDL Ratio: 4
Triglycerides: 218 mg/dL — ABNORMAL HIGH (ref 0.0–149.0)
VLDL: 43.6 mg/dL — ABNORMAL HIGH (ref 0.0–40.0)

## 2018-06-11 LAB — HEMOGLOBIN A1C: Hgb A1c MFr Bld: 9.4 % — ABNORMAL HIGH (ref 4.6–6.5)

## 2018-06-11 NOTE — Addendum Note (Signed)
Addended by: Gwynne Edinger on: 06/11/2018 10:55 AM   Modules accepted: Orders

## 2018-06-16 DIAGNOSIS — M5126 Other intervertebral disc displacement, lumbar region: Secondary | ICD-10-CM | POA: Diagnosis not present

## 2018-06-16 DIAGNOSIS — M5416 Radiculopathy, lumbar region: Secondary | ICD-10-CM | POA: Diagnosis not present

## 2018-06-16 MED ORDER — DULAGLUTIDE 0.75 MG/0.5ML ~~LOC~~ SOAJ: 0.7500 mg | SUBCUTANEOUS | 2 refills | Status: DC

## 2018-06-16 NOTE — Addendum Note (Signed)
Addended by: Rebecca Eaton on: 06/16/2018 02:58 PM   Modules accepted: Orders

## 2018-07-14 DIAGNOSIS — M542 Cervicalgia: Secondary | ICD-10-CM | POA: Diagnosis not present

## 2018-07-14 DIAGNOSIS — M545 Low back pain: Secondary | ICD-10-CM | POA: Diagnosis not present

## 2018-07-15 ENCOUNTER — Ambulatory Visit: Payer: Medicare Other

## 2018-07-16 DIAGNOSIS — M545 Low back pain: Secondary | ICD-10-CM | POA: Diagnosis not present

## 2018-07-17 DIAGNOSIS — M47812 Spondylosis without myelopathy or radiculopathy, cervical region: Secondary | ICD-10-CM | POA: Diagnosis not present

## 2018-07-17 DIAGNOSIS — M5126 Other intervertebral disc displacement, lumbar region: Secondary | ICD-10-CM | POA: Diagnosis not present

## 2018-07-17 DIAGNOSIS — M48061 Spinal stenosis, lumbar region without neurogenic claudication: Secondary | ICD-10-CM | POA: Diagnosis not present

## 2018-07-23 ENCOUNTER — Other Ambulatory Visit: Payer: Self-pay | Admitting: Family Medicine

## 2018-07-23 DIAGNOSIS — M542 Cervicalgia: Secondary | ICD-10-CM | POA: Diagnosis not present

## 2018-07-23 DIAGNOSIS — M545 Low back pain: Secondary | ICD-10-CM | POA: Diagnosis not present

## 2018-07-23 DIAGNOSIS — M5126 Other intervertebral disc displacement, lumbar region: Secondary | ICD-10-CM | POA: Diagnosis not present

## 2018-07-24 ENCOUNTER — Other Ambulatory Visit: Payer: Self-pay | Admitting: Cardiovascular Disease

## 2018-07-30 DIAGNOSIS — M5126 Other intervertebral disc displacement, lumbar region: Secondary | ICD-10-CM | POA: Diagnosis not present

## 2018-08-26 ENCOUNTER — Other Ambulatory Visit: Payer: Self-pay | Admitting: Family Medicine

## 2018-08-26 ENCOUNTER — Telehealth: Payer: Self-pay | Admitting: Family Medicine

## 2018-08-26 ENCOUNTER — Other Ambulatory Visit: Payer: Self-pay

## 2018-08-26 ENCOUNTER — Encounter: Payer: Self-pay | Admitting: Family Medicine

## 2018-08-26 DIAGNOSIS — E119 Type 2 diabetes mellitus without complications: Secondary | ICD-10-CM

## 2018-08-26 NOTE — Telephone Encounter (Signed)
Patient called and cancelled an appointment with you and made a lab appointment only for his A1c. Do you want me to make sure he has an appointment with you??

## 2018-08-26 NOTE — Telephone Encounter (Signed)
Order has been placed.

## 2018-08-26 NOTE — Telephone Encounter (Signed)
Patient is requesting to get his A1C done.  He has a lab appt on Monday, 08/31/18.  Can he get a lab order put in for this.

## 2018-08-26 NOTE — Telephone Encounter (Signed)
Made lab appt per patient request.

## 2018-08-26 NOTE — Telephone Encounter (Signed)
Let's get lab first.

## 2018-08-30 ENCOUNTER — Encounter: Payer: Self-pay | Admitting: Family Medicine

## 2018-08-31 ENCOUNTER — Encounter: Payer: Self-pay | Admitting: Family Medicine

## 2018-08-31 ENCOUNTER — Other Ambulatory Visit: Payer: Self-pay | Admitting: Family Medicine

## 2018-08-31 ENCOUNTER — Other Ambulatory Visit (INDEPENDENT_AMBULATORY_CARE_PROVIDER_SITE_OTHER): Payer: Medicare Other

## 2018-08-31 ENCOUNTER — Ambulatory Visit: Payer: Medicare Other | Admitting: Family Medicine

## 2018-08-31 ENCOUNTER — Other Ambulatory Visit: Payer: Self-pay

## 2018-08-31 DIAGNOSIS — E119 Type 2 diabetes mellitus without complications: Secondary | ICD-10-CM

## 2018-08-31 DIAGNOSIS — Z23 Encounter for immunization: Secondary | ICD-10-CM | POA: Diagnosis not present

## 2018-08-31 LAB — POCT GLYCOSYLATED HEMOGLOBIN (HGB A1C): HbA1c, POC (prediabetic range): 7.4 % — AB (ref 5.7–6.4)

## 2018-09-28 IMAGING — CT CT ABD-PELV W/ CM
2 of 5 series · 16 of 46 positions shown, 18 images · IV contrast (APPLIED)
Comparison: No comparison CT.

CLINICAL DATA: 70-year-old diabetic male with mid abdominal pain
for 2 months. Post appendectomy. Initial encounter.

Creatinine was obtained on site at [HOSPITAL] at [HOSPITAL].Results: Creatinine 1.2 mg/dL.
EXAM:
CT ABDOMEN AND PELVIS WITH CONTRAST
TECHNIQUE: Multidetector CT imaging of the abdomen and pelvis was performed
using the standard protocol following bolus administration of
intravenous contrast.
CONTRAST:  100mL [id number] IOPAMIDOL ([id number]) INJECTION 61%

[Series 2: abd/pelvis w/cm · axial · 0.82mm/px · z∈[+724,+1149]mm · 13 of 97 slices shown, 15 images]
[im 6/97  soft-tissue]
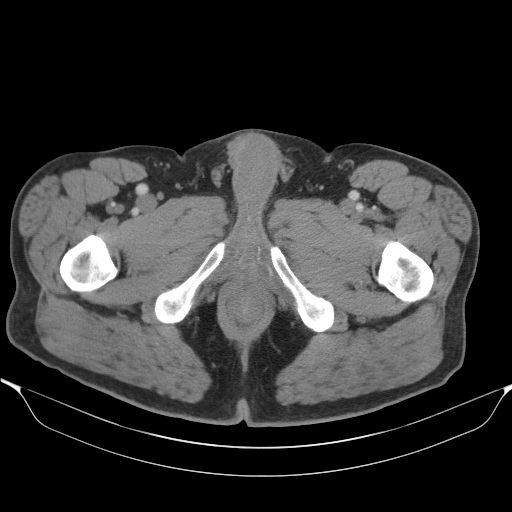
[im 6/97  bone]
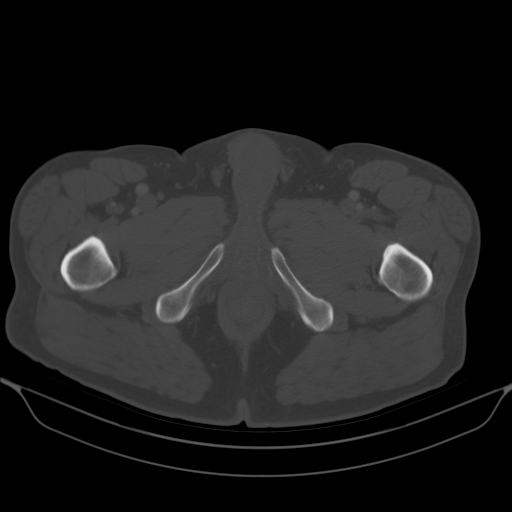
[im 12/97  soft-tissue]
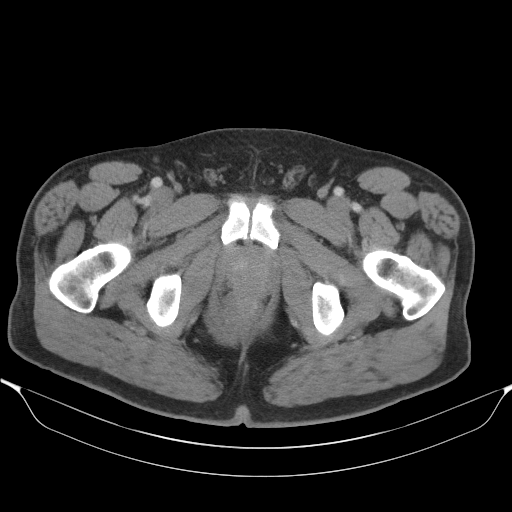
[im 23/97  soft-tissue]
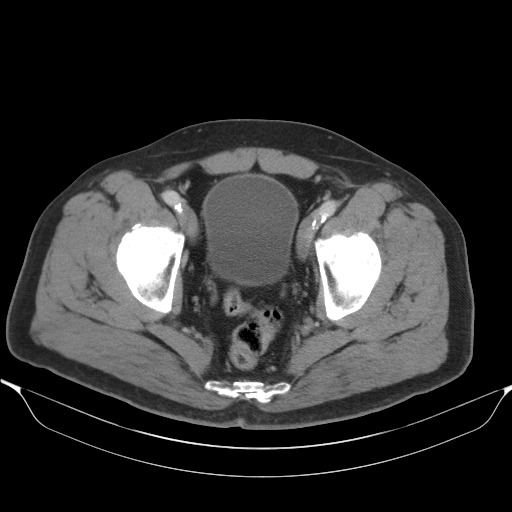
[im 29/97  soft-tissue]
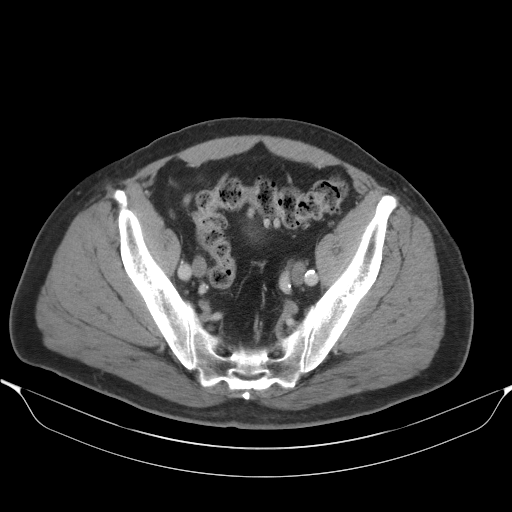
[im 34/97  soft-tissue]
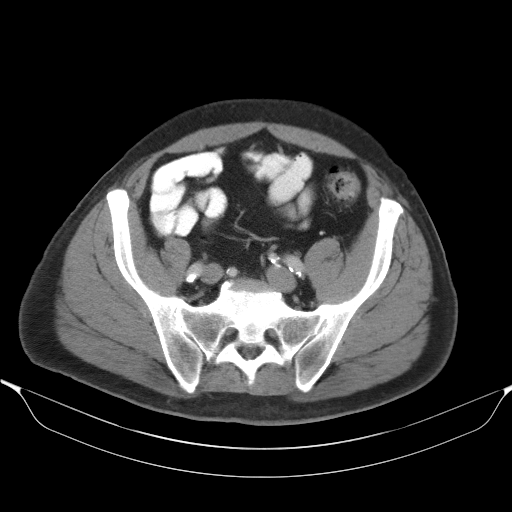
[im 40/97  soft-tissue]
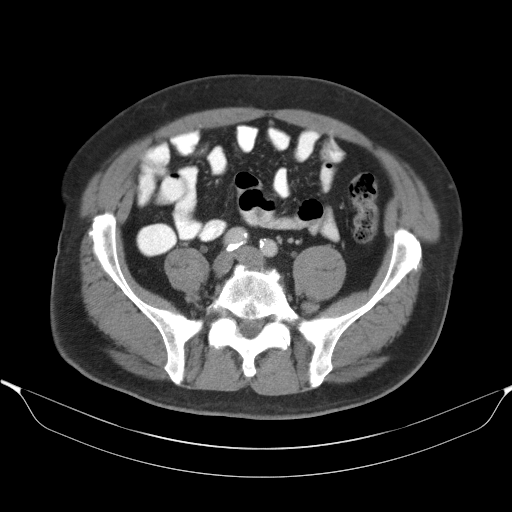
[im 51/97  soft-tissue]
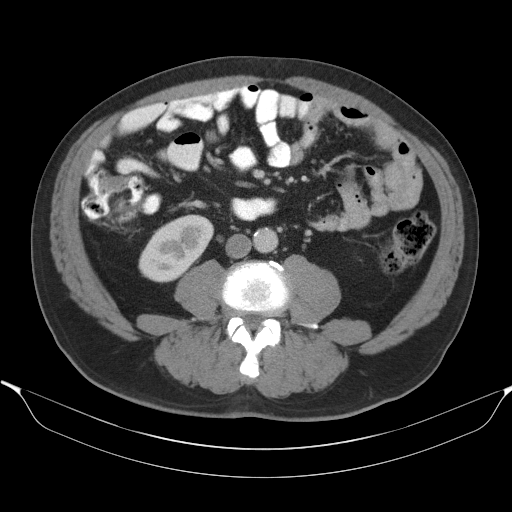
[im 57/97  soft-tissue]
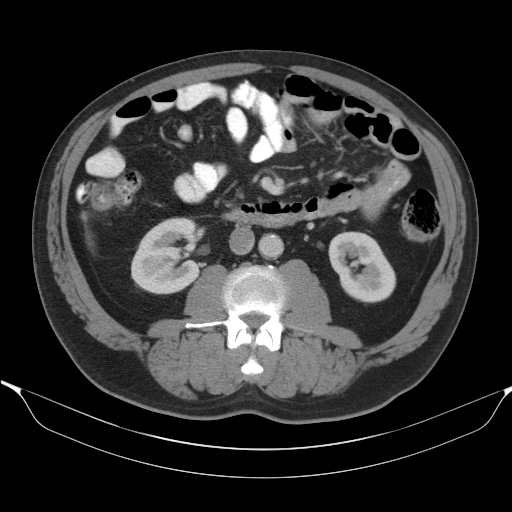
[im 63/97  soft-tissue]
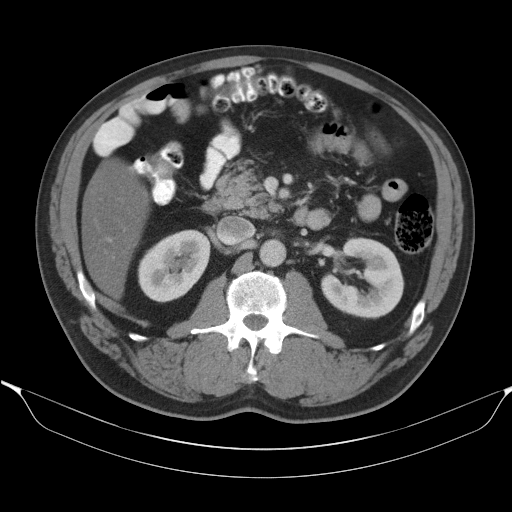
[im 63/97  bone]
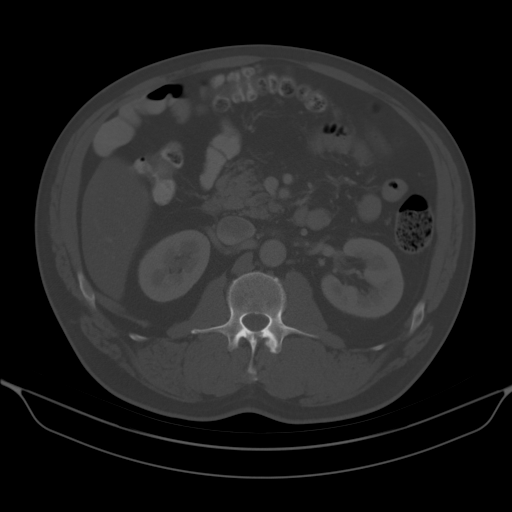
[im 68/97  soft-tissue]
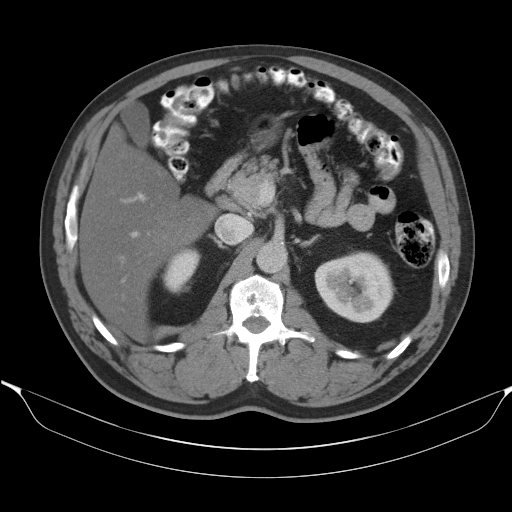
[im 74/97  soft-tissue]
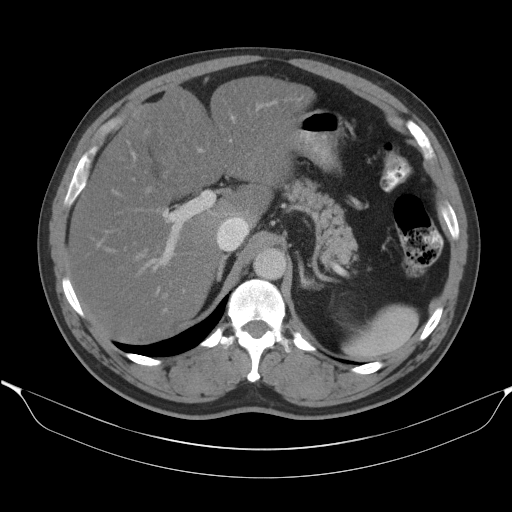
[im 85/97  soft-tissue]
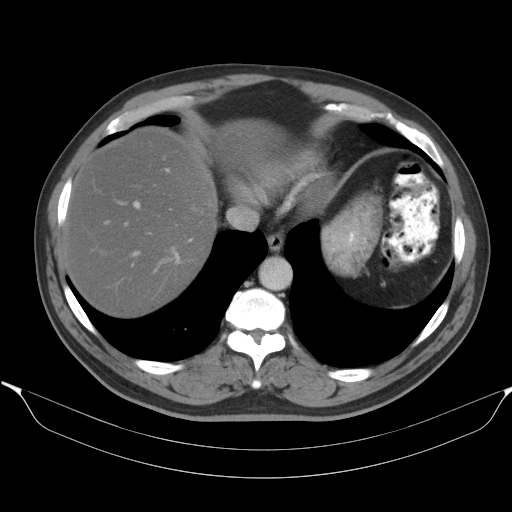
[im 91/97  soft-tissue]
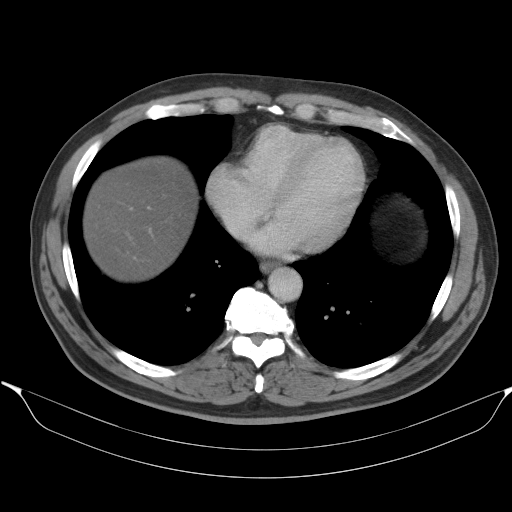

[Series 3: cor · coronal · 0.72mm/px · 3 of 102 slices shown]
[im 34/102  soft-tissue]
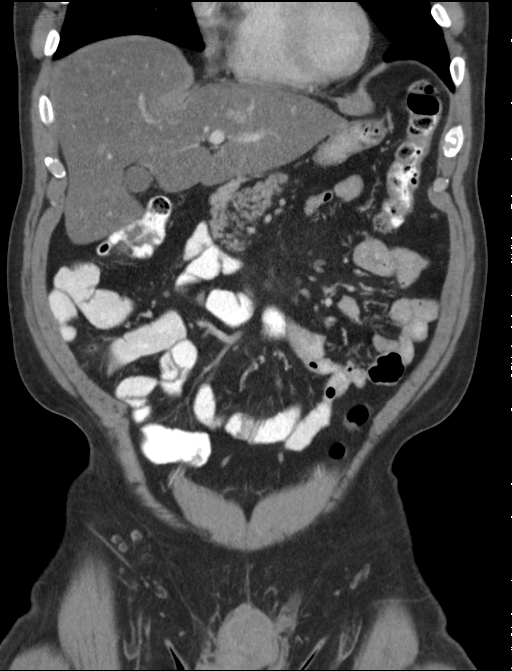
[im 45/102  soft-tissue]
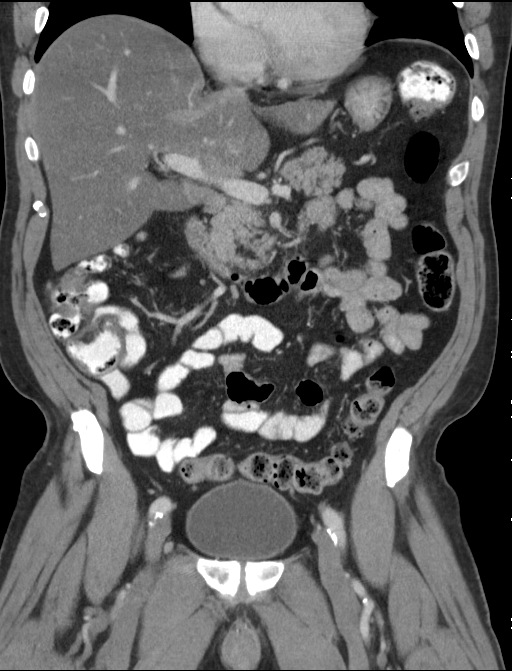
[im 57/102  soft-tissue]
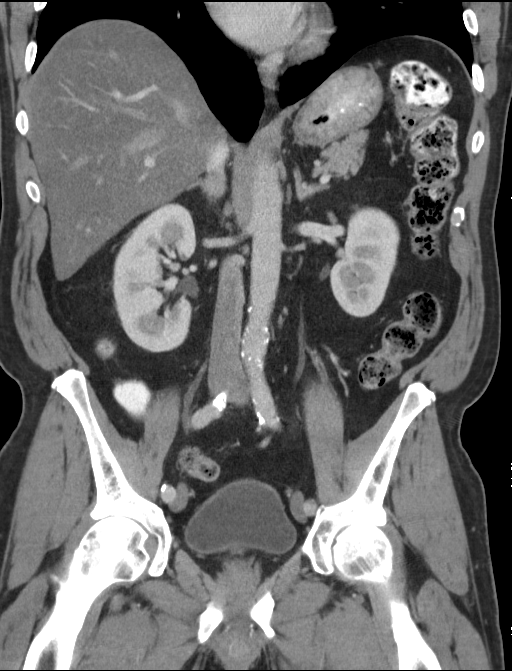

[16 of 46 positions shown; findings below may reference images not displayed]

FINDINGS: Lower chest: Lung bases clear. Question coronary artery
calcifications. Heart size within normal limits.

Hepatobiliary: Elongated liver spanning over 19.2 cm. Diffuse fatty
infiltration of liver without focal worrisome lesion. No calcified
gallstones.

Pancreas: Regions of mild fatty infiltration sparing portions of the
pancreatic tail and pancreatic head/neck region without discrete
mass or inflammation noted.

Spleen: No mass or enlargement.

Adrenals/Urinary Tract: No renal or ureteral obstructing stone. No
hydronephrosis. No renal or adrenal mass.

Stomach/Bowel: Scattered sigmoid diverticula. No extra luminal bowel
inflammatory process, free fluid or free air. Portions of stomach
under distended and evaluation limited.

Vascular/Lymphatic: Calcified plaque aorta, iliac arteries and
femoral arteries with slight narrowing/ ectasia without focal
aneurysm or high-grade occlusion. No adenopathy.

Reproductive: Slight asymmetric prostate gland with mild lobularity.
Clinical and laboratory correlation recommended.

Other: Negative

Musculoskeletal: Degenerative changes L4-5.
IMPRESSION: Scattered sigmoid diverticula without inflammation.

Elongated fatty liver.

Atherosclerotic changes as noted above.

Slightly asymmetric lobulated prostate gland. Clinical and
laboratory correlation recommended.

## 2018-10-13 ENCOUNTER — Encounter: Payer: Self-pay | Admitting: Family Medicine

## 2018-11-05 DIAGNOSIS — H6123 Impacted cerumen, bilateral: Secondary | ICD-10-CM | POA: Diagnosis not present

## 2018-11-05 DIAGNOSIS — R079 Chest pain, unspecified: Secondary | ICD-10-CM | POA: Diagnosis not present

## 2018-11-05 DIAGNOSIS — R05 Cough: Secondary | ICD-10-CM | POA: Diagnosis not present

## 2018-12-12 DIAGNOSIS — R079 Chest pain, unspecified: Secondary | ICD-10-CM | POA: Diagnosis not present

## 2018-12-12 DIAGNOSIS — Z888 Allergy status to other drugs, medicaments and biological substances status: Secondary | ICD-10-CM | POA: Diagnosis not present

## 2018-12-12 DIAGNOSIS — R0789 Other chest pain: Secondary | ICD-10-CM | POA: Diagnosis not present

## 2018-12-12 DIAGNOSIS — I493 Ventricular premature depolarization: Secondary | ICD-10-CM | POA: Diagnosis not present

## 2018-12-12 DIAGNOSIS — Z7984 Long term (current) use of oral hypoglycemic drugs: Secondary | ICD-10-CM | POA: Diagnosis not present

## 2018-12-12 DIAGNOSIS — E119 Type 2 diabetes mellitus without complications: Secondary | ICD-10-CM | POA: Diagnosis not present

## 2018-12-17 ENCOUNTER — Encounter: Payer: Self-pay | Admitting: Family Medicine

## 2018-12-23 ENCOUNTER — Ambulatory Visit: Payer: Medicare Other | Admitting: Family Medicine

## 2018-12-25 ENCOUNTER — Other Ambulatory Visit: Payer: Self-pay

## 2018-12-28 ENCOUNTER — Other Ambulatory Visit: Payer: Self-pay

## 2018-12-28 ENCOUNTER — Encounter: Payer: Self-pay | Admitting: Family Medicine

## 2018-12-28 ENCOUNTER — Ambulatory Visit (INDEPENDENT_AMBULATORY_CARE_PROVIDER_SITE_OTHER): Payer: Medicare Other | Admitting: Family Medicine

## 2018-12-28 VITALS — BP 126/80 | HR 86 | Temp 97.1°F | Ht 74.0 in | Wt 225.9 lb

## 2018-12-28 DIAGNOSIS — E119 Type 2 diabetes mellitus without complications: Secondary | ICD-10-CM | POA: Diagnosis not present

## 2018-12-28 LAB — POCT GLYCOSYLATED HEMOGLOBIN (HGB A1C): Hemoglobin A1C: 7.1 % — AB (ref 4.0–5.6)

## 2018-12-28 NOTE — Progress Notes (Signed)
Subjective:     Patient ID: Todd [name], male   DOB: [birth year], 73 y.o.   MRN: [id number]  HPI   Todd [name] is seen today for medical follow-up.  He has type 2 diabetes which had been poorly controlled.  Last summer A1c was 9.4%.  We added Trulicity and he had follow-up in August 7.4% and down today to 7.1%.  Still not exercising regularly.  No polyuria or polydipsia.  Generally feels well.  He did have recent episode of atypical chest pain.  He went to urgent care and had subsequently ER.  He had chest x-ray, EKG, multiple labs.  These were all unremarkable.  D-dimer was normal.  Symptoms were very atypical and has had none since then.  No exertional symptoms.  He had nuclear stress test 2017 which was normal.  Past Medical History:  Diagnosis Date  . DIABETES MELLITUS, TYPE II 08/08/2009  . Hiatal hernia   . HYPERLIPIDEMIA 08/08/2009  . Palpitations   . URINARY INCONTINENCE 08/08/2009   Past Surgical History:  Procedure Laterality Date  . ACHILLES TENDON REPAIR  2002   rupture  . APPENDECTOMY  1976  . CARDIOVASCULAR STRESS TEST  11-02-2003   EF 57%  . TONSILLECTOMY  1961  . US ECHOCARDIOGRAPHY  02-10-2001   EF 60-65%    reports that he quit smoking about 41 years ago. His smoking use included cigarettes. He has a 20.00 pack-year smoking history. He has never used smokeless tobacco. He reports current alcohol use. He reports that he does not use drugs. family history includes Diabetes in his sister; Mental illness in his mother. Allergies  Allergen Reactions  . Jardiance [Empagliflozin]     Yeast infections  . Sudafed Pe Sinus Cong Day-Nght  [Diphenhydramine-Phenylephrine] Other (See Comments)  . Actifed Cold-Sinus     rash  . Lisinopril Swelling  . Pseudoephedrine     REACTION: hives     Review of Systems  Constitutional: Negative for fatigue and unexpected weight change.  Eyes: Negative for visual disturbance.  Respiratory: Negative for cough, chest tightness and shortness of  breath.   Cardiovascular: Negative for chest pain, palpitations and leg swelling.  Endocrine: Negative for polydipsia and polyuria.  Neurological: Negative for dizziness, syncope, weakness, light-headedness and headaches.       Objective:   Physical Exam Constitutional:      Appearance: He is well-developed.  HENT:     Right Ear: External ear normal.     Left Ear: External ear normal.  Eyes:     Pupils: Pupils are equal, round, and reactive to light.  Neck:     Thyroid: No thyromegaly.  Cardiovascular:     Rate and Rhythm: Normal rate and regular rhythm.  Pulmonary:     Effort: Pulmonary effort is normal. No respiratory distress.     Breath sounds: Normal breath sounds. No wheezing or rales.  Musculoskeletal:     Cervical back: Neck supple.  Skin:    Comments: Feet reveal no skin lesions. Good distal foot pulses. Good capillary refill. No calluses. Normal sensation with monofilament testing   Neurological:     Mental Status: He is alert and oriented to person, place, and time.        Assessment:     #1 type 2 diabetes improving with A1c 7.1%  #2 recent atypical chest pain-resolved.  Suspected reflux related    Plan:     -We encouraged him to step up exercise and we will plan on 44-month reassessment.  Hopefully we can get A1c below 7 at that time -follow up sooner for any recurrent chest symptoms.  [name] MD [facility]

## 2019-01-18 NOTE — Progress Notes (Signed)
Cardiology Office Note:    Date:  01/19/2019   ID:  Todd [name], DOB Mar 30, 1945, MRN [id number]  PCP:  Todd Post, MD  Cardiologist:  Todd Moores, MD  Electrophysiologist:  None   Referring MD: Todd Post, MD   Chief Complaint  Patient presents with  . Follow-up    HTN, coronary Ca    History of Present Illness:    Todd [name] is a 74 y.o. male with:   Chest pain   Myoview 2005, 2015, - normal  Coronary calcifications on CT  abd CT in 2017  Hypertension   Diabetes mellitus   Hyperlipidemia   Palpitations   Todd [name] was last seen by Dr. Acie [name] in 01/2018.    He returns for annual follow-up.  He had a trip to the emergency room about 6 weeks ago with chest discomfort.  His troponins were negative.  He was placed on antacid Rx and has not had a recurrence.  However, he is not as active as he usually is and has not been to exercise.  He has not had any recent chest pain, shortness of breath, syncope, orthopnea, PND.  Prior CV studies:   The following studies were reviewed today:  Myoview 07/21/15  Nuclear stress EF: 54%. normal LV function  The study is normal. no reversible ischemia . normal LV function  This is a low risk study.  Myoview 05/12/12  Low risk stress nuclear study.  Fixed medium-sized, mild inferior perfusion defect extending from base to apex.  Normal wall motion suggests that this could be diaphragmatic attenuation but cannot rule out prior inferior MI.  No ischemia.  LV Ejection Fraction: 61%.    AAA [place] 10/2011 Normal caliber abd aorta  Echocardiogram 02/2001 EF 60-65, mild to mod IVS LVH, normal wall motion, mild to mod BAE  Past Medical History:  Diagnosis Date  . DIABETES MELLITUS, TYPE II 08/08/2009  . Hiatal hernia   . HYPERLIPIDEMIA 08/08/2009  . Palpitations   . URINARY INCONTINENCE 08/08/2009   Surgical Hx: The patient  has a past surgical history that includes Appendectomy (1976); Tonsillectomy (1961); Achilles  tendon repair (2002); US ECHOCARDIOGRAPHY (02-10-2001); and Cardiovascular stress test (11-02-2003).   Current Medications: Current Meds  Medication Sig  . alfuzosin (UROXATRAL) 10 MG 24 hr tablet Take 10 mg by mouth 2 (two) times daily.  . Coenzyme Q10 (CO Q 10 PO) Take 200 mg by mouth daily.  . fenofibrate 160 MG tablet TAKE 1 TABLET BY MOUTH  DAILY  . metFORMIN (GLUCOPHAGE) 1000 MG tablet TAKE 1 TABLET BY MOUTH  TWICE DAILY WITH MEALS  . polycarbophil (FIBERCON) 625 MG tablet Take 625 mg by mouth daily. Taking 2 Tablet  . rosuvastatin (CRESTOR) 10 MG tablet TAKE 1 TABLET BY MOUTH  DAILY  . TRULICITY [id number] [id number] SOPN INJECT 0.75 MG INTO THE  SKIN ONCE A WEEK.  . vitamin B-12 (CYANOCOBALAMIN) 1000 MCG tablet Take 1,000 mcg by mouth daily.  Marland Kitchen VITAMIN D, ERGOCALCIFEROL, PO Take 52 mcg by mouth daily.     Allergies:   Jardiance [empagliflozin], Sudafed pe sinus cong day-nght  [diphenhydramine-phenylephrine], Actifed cold-sinus, Lisinopril, and Pseudoephedrine   Social History   Tobacco Use  . Smoking status: Former Smoker    Packs/day: 2.00    Years: 10.00    Pack years: 20.00    Types: Cigarettes    Quit date: 04/19/1977    Years since quitting: 41.7  . Smokeless tobacco: Never Used  . Tobacco  comment: discussed AAA but think he has had this  Substance Use Topics  . Alcohol use: Yes    Comment:  glass of wine x 2 per week  . Drug use: No     Family Hx: The patient's family history includes Diabetes in his sister; Mental illness in his mother.  ROS:   Please see the history of present illness.    ROS All other systems reviewed and are negative.   EKGs/Labs/Other Test Reviewed:    EKG:  EKG is ordered today.  The ekg ordered today demonstrates NSR, inf TW inversions, no significant changes   Recent Labs: 06/11/2018: ALT 31; BUN 16; Creatinine, Ser 1.01; Potassium 4.5; Sodium 138   Recent Lipid Panel Lab Results  Component Value Date/Time   CHOL 148 06/11/2018 10:52 AM     CHOL 143 01/14/2018 11:46 AM   TRIG 218.0 (H) 06/11/2018 10:52 AM   HDL 40.80 06/11/2018 10:52 AM   HDL 37 (L) 01/14/2018 11:46 AM   CHOLHDL 4 06/11/2018 10:52 AM   LDLCALC 72 01/14/2018 11:46 AM   LDLDIRECT 84.0 06/11/2018 10:52 AM    Physical Exam:    VS:  BP 126/80   Pulse 72   Ht 6\' 2"  (1.88 m)   Wt 227 lb 12.8 oz (103.3 kg)   BMI 29.25 kg/m     Wt Readings from Last 3 Encounters:  01/19/19 227 lb 12.8 oz (103.3 kg)  12/28/18 225 lb 14.4 oz (102.5 kg)  01/14/18 231 lb 3.2 oz (104.9 kg)     Physical Exam  Constitutional: He is oriented to person, place, and time. He appears well-developed and well-nourished. No distress.  HENT:  Head: Normocephalic and atraumatic.  Eyes: No scleral icterus.  Neck: No JVD present. Carotid bruit is not present. No thyromegaly present.  Cardiovascular: Normal rate, regular rhythm and normal heart sounds.  No murmur heard. Pulmonary/Chest: Effort normal and breath sounds normal. He has no rales.  Abdominal: Soft. There is no hepatomegaly.  Musculoskeletal:        General: Edema (1+ R ankle edema) present.  Lymphadenopathy:    He has no cervical adenopathy.  Neurological: He is alert and oriented to person, place, and time.  Skin: Skin is warm and dry.  Psychiatric: He has a normal mood and affect.    ASSESSMENT & PLAN:    1. Precordial chest pain He had an episode of chest discomfort went to the emergency room about 6 weeks ago.  He had a negative work-up.  He does have a history of diabetes.  There are some T wave inversions inferiorly on his EKG.  However, these have been evident in the past.  It has been several years since his last assessment for ischemia.  As he is a diabetic, I have suggested we go ahead and proceed with a Lexiscan Myoview to rule out ischemia.  Continue statin therapy.  2. Essential hypertension The patient's blood pressure is controlled on his current regimen.  Continue current therapy.   3. Mixed  hyperlipidemia Most recent LDL 84.  He should have another lipid panel done with primary care in June.  If his LDL continues to increase, consider increasing rosuvastatin to 20 mg daily.   Dispo:  Return in about 1 year (around 01/19/2020) for Routine Follow Up, w/ Dr. Acie [name], or Richardson Dopp, PA-C, in person.   Medication Adjustments/Labs and Tests Ordered: Current medicines are reviewed at length with the patient today.  Concerns regarding medicines are outlined above.  Tests Ordered: Orders Placed This Encounter  Procedures  . Myocardial Perfusion Imaging  . EKG 12-Lead   Medication Changes: No orders of the defined types were placed in this encounter.   Signed, Richardson Dopp, PA-C  01/19/2019 5:05 PM    [facility] [place], [place],   [address] Phone: [phone]; Fax: [phone]

## 2019-01-19 ENCOUNTER — Ambulatory Visit (INDEPENDENT_AMBULATORY_CARE_PROVIDER_SITE_OTHER): Payer: Medicare Other | Admitting: Physician Assistant

## 2019-01-19 ENCOUNTER — Encounter: Payer: Self-pay | Admitting: Physician Assistant

## 2019-01-19 ENCOUNTER — Other Ambulatory Visit: Payer: Self-pay

## 2019-01-19 VITALS — BP 126/80 | HR 72 | Ht 74.0 in | Wt 227.8 lb

## 2019-01-19 DIAGNOSIS — E782 Mixed hyperlipidemia: Secondary | ICD-10-CM

## 2019-01-19 DIAGNOSIS — I10 Essential (primary) hypertension: Secondary | ICD-10-CM | POA: Diagnosis not present

## 2019-01-19 DIAGNOSIS — R072 Precordial pain: Secondary | ICD-10-CM

## 2019-01-19 NOTE — Patient Instructions (Signed)
Medication Instructions:   Your physician recommends that you continue on your current medications as directed. Please refer to the Current Medication list given to you today.  *If you need a refill on your cardiac medications before your next appointment, please call your pharmacy*  Lab Work:  None ordered today  If you have labs (blood work) drawn today and your tests are completely normal, you will receive your results only by: Marland Kitchen MyChart Message (if you have MyChart) OR . A paper copy in the mail If you have any lab test that is abnormal or we need to change your treatment, we will call you to review the results.  Testing/Procedures:  Your physician has requested that you have a lexiscan myoview. For further information please visit [email or web address]. Please follow instruction sheet, as given.   Follow-Up: At [facility], you and your health needs are our priority.  As part of our continuing mission to provide you with exceptional heart care, we have created designated Provider Care Teams.  These Care Teams include your primary Cardiologist (physician) and Advanced Practice Providers (APPs -  Physician Assistants and Nurse Practitioners) who all work together to provide you with the care you need, when you need it.  Your next appointment:   12 month(s)  The format for your next appointment:   In Person  Provider:   You may see Mertie Moores, MD or one of the following Advanced Practice Providers on your designated Care Team:    Richardson Dopp, PA-C  [place], [place]  Daune Perch, [place]

## 2019-01-22 ENCOUNTER — Encounter: Payer: Self-pay | Admitting: Family Medicine

## 2019-01-23 MED ORDER — PANTOPRAZOLE SODIUM 20 MG PO TBEC: 20.0000 mg | DELAYED_RELEASE_TABLET | Freq: Every day | ORAL | 0 refills | Status: DC

## 2019-01-23 NOTE — Telephone Encounter (Signed)
I sent in one month refill to [facility].  I would suggest not remaining on this indefinitely.  Would try to taper off over next few weeks.   Can offer follow up if he would like to discuss further.  protonix can reduce absorption of B12, calcium, and magnesium with long term use.

## 2019-01-26 ENCOUNTER — Encounter: Payer: Self-pay | Admitting: Family Medicine

## 2019-01-27 DIAGNOSIS — H35373 Puckering of macula, bilateral: Secondary | ICD-10-CM | POA: Diagnosis not present

## 2019-01-27 DIAGNOSIS — H2513 Age-related nuclear cataract, bilateral: Secondary | ICD-10-CM | POA: Diagnosis not present

## 2019-01-27 DIAGNOSIS — E119 Type 2 diabetes mellitus without complications: Secondary | ICD-10-CM | POA: Diagnosis not present

## 2019-01-27 LAB — HM DIABETES EYE EXAM

## 2019-02-02 ENCOUNTER — Telehealth (HOSPITAL_COMMUNITY): Payer: Self-pay | Admitting: *Deleted

## 2019-02-02 ENCOUNTER — Encounter (HOSPITAL_COMMUNITY): Payer: Self-pay | Admitting: *Deleted

## 2019-02-02 NOTE — Telephone Encounter (Signed)
Left message on voicemail per DPR in reference to upcoming appointment scheduled on 02/10/2019 at 1000 with detailed instructions given per Myocardial Perfusion Study Information Sheet for the test. LM to arrive 15 minutes early, and that it is imperative to arrive on time for appointment to keep from having the test rescheduled. If you need to cancel or reschedule your appointment, please call the office within 24 hours of your appointment. Failure to do so may result in a cancellation of your appointment, and a $50 no show fee. Phone number given for call back for any questions.   mychart letter sent with instructions.Todd [name], Ranae Palms

## 2019-02-10 ENCOUNTER — Other Ambulatory Visit: Payer: Self-pay

## 2019-02-10 ENCOUNTER — Ambulatory Visit (HOSPITAL_COMMUNITY): Payer: Medicare Other | Attending: Cardiology

## 2019-02-10 VITALS — Ht 74.0 in | Wt 227.0 lb

## 2019-02-10 DIAGNOSIS — I10 Essential (primary) hypertension: Secondary | ICD-10-CM

## 2019-02-10 DIAGNOSIS — E782 Mixed hyperlipidemia: Secondary | ICD-10-CM

## 2019-02-10 DIAGNOSIS — R110 Nausea: Secondary | ICD-10-CM | POA: Insufficient documentation

## 2019-02-10 DIAGNOSIS — R072 Precordial pain: Secondary | ICD-10-CM

## 2019-02-10 LAB — MYOCARDIAL PERFUSION IMAGING
LV dias vol: 123 mL (ref 62–150)
LV sys vol: 57 mL
Peak HR: 92 {beats}/min
Rest HR: 67 {beats}/min
SDS: 1
SRS: 2
SSS: 3
TID: 1.15

## 2019-02-10 MED ORDER — TECHNETIUM TC 99M TETROFOSMIN IV KIT
10.8000 | PACK | Freq: Once | INTRAVENOUS | Status: AC | PRN
  Administered 2019-02-10: 10.8 via INTRAVENOUS
  Filled 2019-02-10: qty 11

## 2019-02-10 MED ORDER — REGADENOSON 0.4 MG/5ML IV SOLN
0.4000 mg | Freq: Once | INTRAVENOUS | Status: AC
  Administered 2019-02-10: 0.4 mg via INTRAVENOUS

## 2019-02-10 MED ORDER — AMINOPHYLLINE 25 MG/ML IV SOLN
75.0000 mg | Freq: Once | INTRAVENOUS | Status: AC
  Administered 2019-02-10: 75 mg via INTRAVENOUS

## 2019-02-10 MED ORDER — TECHNETIUM TC 99M TETROFOSMIN IV KIT
31.9000 | PACK | Freq: Once | INTRAVENOUS | Status: AC | PRN
  Administered 2019-02-10: 31.9 via INTRAVENOUS
  Filled 2019-02-10: qty 32

## 2019-02-11 ENCOUNTER — Encounter: Payer: Self-pay | Admitting: Family Medicine

## 2019-02-11 ENCOUNTER — Encounter: Payer: Self-pay | Admitting: Physician Assistant

## 2019-04-12 DIAGNOSIS — M5126 Other intervertebral disc displacement, lumbar region: Secondary | ICD-10-CM | POA: Diagnosis not present

## 2019-04-15 DIAGNOSIS — M5126 Other intervertebral disc displacement, lumbar region: Secondary | ICD-10-CM | POA: Diagnosis not present

## 2019-04-20 ENCOUNTER — Other Ambulatory Visit: Payer: Self-pay

## 2019-04-21 ENCOUNTER — Encounter: Payer: Self-pay | Admitting: Family Medicine

## 2019-04-21 ENCOUNTER — Ambulatory Visit (INDEPENDENT_AMBULATORY_CARE_PROVIDER_SITE_OTHER): Payer: Medicare Other | Admitting: Family Medicine

## 2019-04-21 VITALS — BP 118/72 | HR 86 | Temp 98.0°F | Wt 222.2 lb

## 2019-04-21 DIAGNOSIS — E119 Type 2 diabetes mellitus without complications: Secondary | ICD-10-CM

## 2019-04-21 LAB — POCT GLYCOSYLATED HEMOGLOBIN (HGB A1C): Hemoglobin A1C: 7.4 % — AB (ref 4.0–5.6)

## 2019-04-21 NOTE — Patient Instructions (Signed)
Diabetes Mellitus and Exercise Exercising regularly is important for your overall health, especially when you have diabetes (diabetes mellitus). Exercising is not only about losing weight. It has many other health benefits, such as increasing muscle strength and bone density and reducing body fat and stress. This leads to improved fitness, flexibility, and endurance, all of which result in better overall health. Exercise has additional benefits for people with diabetes, including:  Reducing appetite.  Helping to lower and control blood glucose.  Lowering blood pressure.  Helping to control amounts of fatty substances (lipids) in the blood, such as cholesterol and triglycerides.  Helping the body to respond better to insulin (improving insulin sensitivity).  Reducing how much insulin the body needs.  Decreasing the risk for heart disease by: ? Lowering cholesterol and triglyceride levels. ? Increasing the levels of good cholesterol. ? Lowering blood glucose levels. What is my activity plan? Your health care provider or certified diabetes educator can help you make a plan for the type and frequency of exercise (activity plan) that works for you. Make sure that you:  Do at least 150 minutes of moderate-intensity or vigorous-intensity exercise each week. This could be brisk walking, biking, or water aerobics. ? Do stretching and strength exercises, such as yoga or weightlifting, at least 2 times a week. ? Spread out your activity over at least 3 days of the week.  Get some form of physical activity every day. ? Do not go more than 2 days in a row without some kind of physical activity. ? Avoid being inactive for more than 30 minutes at a time. Take frequent breaks to walk or stretch.  Choose a type of exercise or activity that you enjoy, and set realistic goals.  Start slowly, and gradually increase the intensity of your exercise over time. What do I need to know about managing my  diabetes?   Check your blood glucose before and after exercising. ? If your blood glucose is 240 mg/dL (13.3 mmol/L) or higher before you exercise, check your urine for ketones. If you have ketones in your urine, do not exercise until your blood glucose returns to normal. ? If your blood glucose is 100 mg/dL (5.6 mmol/L) or lower, eat a snack containing 15-20 grams of carbohydrate. Check your blood glucose 15 minutes after the snack to make sure that your level is above 100 mg/dL (5.6 mmol/L) before you start your exercise.  Know the symptoms of low blood glucose (hypoglycemia) and how to treat it. Your risk for hypoglycemia increases during and after exercise. Common symptoms of hypoglycemia can include: ? Hunger. ? Anxiety. ? Sweating and feeling clammy. ? Confusion. ? Dizziness or feeling light-headed. ? Increased heart rate or palpitations. ? Blurry vision. ? Tingling or numbness around the mouth, lips, or tongue. ? Tremors or shakes. ? Irritability.  Keep a rapid-acting carbohydrate snack available before, during, and after exercise to help prevent or treat hypoglycemia.  Avoid injecting insulin into areas of the body that are going to be exercised. For example, avoid injecting insulin into: ? The arms, when playing tennis. ? The legs, when jogging.  Keep records of your exercise habits. Doing this can help you and your health care provider adjust your diabetes management plan as needed. Write down: ? Food that you eat before and after you exercise. ? Blood glucose levels before and after you exercise. ? The type and amount of exercise you have done. ? When your insulin is expected to peak, if you use   insulin. Avoid exercising at times when your insulin is peaking.  When you start a new exercise or activity, work with your health care provider to make sure the activity is safe for you, and to adjust your insulin, medicines, or food intake as needed.  Drink plenty of water while  you exercise to prevent dehydration or heat stroke. Drink enough fluid to keep your urine clear or pale yellow. Summary  Exercising regularly is important for your overall health, especially when you have diabetes (diabetes mellitus).  Exercising has many health benefits, such as increasing muscle strength and bone density and reducing body fat and stress.  Your health care provider or certified diabetes educator can help you make a plan for the type and frequency of exercise (activity plan) that works for you.  When you start a new exercise or activity, work with your health care provider to make sure the activity is safe for you, and to adjust your insulin, medicines, or food intake as needed. This information is not intended to replace advice given to you by your health care provider. Make sure you discuss any questions you have with your health care provider. Document Revised: 07/18/2016 Document Reviewed: 06/05/2015 Elsevier Patient Education  2020 Elsevier Inc.  

## 2019-04-21 NOTE — Progress Notes (Signed)
Subjective:     Patient ID: Todd [name], male   DOB: 25-Jun-1945, 74 y.o.   MRN: [id number]  HPI [place] has history of hypertension, type 2 diabetes, dyslipidemia.  He had stress test through cardiology recently that came back normal.  He had poorly controlled diabetes last year and we added Trulicity to his Metformin and he had very good response.  His A1c declined from 9.4% down to 7.1%.  He has not been on exercise much recently because of some low back pain.  He had recent epidural injection.  Couple weeks ago he had episode of some abdominal cramping and mild diarrhea which occurred a couple days after his Trulicity.  He has not had any consistent nausea and certainly not immediately after injections.  Not clear this is related to Trulicity and he has been on this for over 6 months.  He skipped his Trulicity dose this past week.  He had eye exam back in January with no retinopathy changes  Past Medical History:  Diagnosis Date  . DIABETES MELLITUS, TYPE II 08/08/2009  . Hiatal hernia   . History of nuclear stress test    Myoview 02/2019: EF 54, apical inf and apical artifact, normal perfusion, low risk  . HYPERLIPIDEMIA 08/08/2009  . Palpitations   . URINARY INCONTINENCE 08/08/2009   Past Surgical History:  Procedure Laterality Date  . ACHILLES TENDON REPAIR  2002   rupture  . APPENDECTOMY  1976  . CARDIOVASCULAR STRESS TEST  11-02-2003   EF 57%  . TONSILLECTOMY  1961  . US ECHOCARDIOGRAPHY  02-10-2001   EF 60-65%    reports that he quit smoking about 42 years ago. His smoking use included cigarettes. He has a 20.00 pack-year smoking history. He has never used smokeless tobacco. He reports current alcohol use. He reports that he does not use drugs. family history includes Diabetes in his sister; Mental illness in his mother. Allergies  Allergen Reactions  . Jardiance [Empagliflozin]     Yeast infections  . Sudafed Pe Sinus Cong Day-Nght  [Diphenhydramine-Phenylephrine] Other (See  Comments)  . Actifed Cold-Sinus     rash  . Lisinopril Swelling  . Pseudoephedrine     REACTION: hives     Review of Systems  Constitutional: Negative for appetite change and unexpected weight change.  Respiratory: Negative for shortness of breath.   Cardiovascular: Negative for chest pain.  Endocrine: Negative for polydipsia and polyuria.       Objective:   Physical Exam Vitals reviewed.  Constitutional:      Appearance: He is well-developed.  Cardiovascular:     Rate and Rhythm: Normal rate and regular rhythm.  Pulmonary:     Effort: Pulmonary effort is normal.     Breath sounds: Normal breath sounds.  Neurological:     Mental Status: He is alert.        Assessment:     Type 2 diabetes stable with A1c 7.4%.  This is up slightly from most recent of 7.1%.  He had episode of some diarrhea and mild nausea but this occurred a couple days after his Trulicity injection and doubt related    Plan:     -We recommend a trial of Trulicity and he will be due for next injection on Sunday  -Continue exercise as tolerated and hopefully this can improve after his back pain is settled down  -Plan 58-month follow-up.  We will repeat A1c then along with urine microalbumin and lipids and chemistries  Alinda Sierras  Czar Ysaguirre MD [facility] at [facility]

## 2019-04-28 ENCOUNTER — Other Ambulatory Visit: Payer: Self-pay | Admitting: Family Medicine

## 2019-04-30 DIAGNOSIS — Z7689 Persons encountering health services in other specified circumstances: Secondary | ICD-10-CM | POA: Diagnosis not present

## 2019-05-14 ENCOUNTER — Telehealth: Payer: Self-pay | Admitting: Family Medicine

## 2019-05-14 NOTE — Chronic Care Management (AMB) (Signed)
  Chronic Care Management   Outreach Note  05/14/2019 Name: Todd [name] MRN: [id number] DOB: Nov 02, 1945  Referred by: [name], MD Reason for referral : Chronic Care Management   An unsuccessful telephone outreach was attempted today. The patient was referred to the pharmacist for assistance with care management and care coordination.   Follow Up Plan:   [place]

## 2019-06-23 ENCOUNTER — Telehealth: Payer: Self-pay | Admitting: Family Medicine

## 2019-06-23 NOTE — Progress Notes (Signed)
  Chronic Care Management   Outreach Note  06/23/2019 Name: Todd [name] MRN: [id number] DOB: 1945/03/21  Referred by: [name], MD Reason for referral : No chief complaint on file.   A second unsuccessful telephone outreach was attempted today. The patient was referred to pharmacist for assistance with care management and care coordination.  Follow Up Plan:   [place]

## 2019-07-15 ENCOUNTER — Ambulatory Visit: Payer: Medicare Other | Admitting: Family Medicine

## 2019-07-15 ENCOUNTER — Ambulatory Visit (INDEPENDENT_AMBULATORY_CARE_PROVIDER_SITE_OTHER): Payer: Medicare Other | Admitting: Family Medicine

## 2019-07-15 ENCOUNTER — Other Ambulatory Visit: Payer: Self-pay

## 2019-07-15 ENCOUNTER — Encounter: Payer: Self-pay | Admitting: Family Medicine

## 2019-07-15 VITALS — BP 122/70 | HR 70 | Temp 97.2°F | Ht 74.0 in | Wt 223.0 lb

## 2019-07-15 DIAGNOSIS — H6123 Impacted cerumen, bilateral: Secondary | ICD-10-CM | POA: Diagnosis not present

## 2019-07-15 DIAGNOSIS — E119 Type 2 diabetes mellitus without complications: Secondary | ICD-10-CM | POA: Diagnosis not present

## 2019-07-15 DIAGNOSIS — I10 Essential (primary) hypertension: Secondary | ICD-10-CM | POA: Diagnosis not present

## 2019-07-15 LAB — POCT GLYCOSYLATED HEMOGLOBIN (HGB A1C): Hemoglobin A1C: 8.1 % — AB (ref 4.0–5.6)

## 2019-07-15 NOTE — Progress Notes (Signed)
   Subjective:    Patient ID: Todd [name], male    DOB: [birth year], 74 y.o.   MRN: [id number]  HPI Here to follow up on diabetes and HTN , and to have his ears cleaned out. He has frequent wax build ups and has this cleaned at our office. His BP has been stable. His diabetes had been under control since his PCP, Dr. Elease Hashimoto, had started him on Trulicity. However in the past few months he has received an epidural steroid injection to the spine and he is getting the RESUME treatments on his prostate. He thought that the Trulicity might interfere with these procedures, so he stopped taking Trulicity about 2 months ago. Consequently his A1c went from 7.4 in April to 8.1 today. He feels well in general.    Review of Systems  Constitutional: Negative.   HENT: Positive for hearing loss. Negative for ear pain.   Respiratory: Negative.   Cardiovascular: Negative.        Objective:   Physical Exam Constitutional:      Appearance: Normal appearance.  HENT:     Right Ear: There is impacted cerumen.     Left Ear: There is impacted cerumen.  Cardiovascular:     Rate and Rhythm: Normal rate and regular rhythm.     Pulses: Normal pulses.     Heart sounds: Normal heart sounds.  Pulmonary:     Effort: Pulmonary effort is normal.     Breath sounds: Normal breath sounds.  Musculoskeletal:     Right lower leg: No edema.     Left lower leg: No edema.  Lymphadenopathy:     Cervical: No cervical adenopathy.  Neurological:     Mental Status: He is alert.           Assessment & Plan:  For the cerumen impactions, these were irrigated clear with water. His HTN is stable. His diabetes is not well controlled currently, and no doubt this is the result of being off Trulicity for the past 2 months. I advised him to get back on this at his previous dosing regimen, and he agreed. He will follow up with Dr. Elease Hashimoto in 3 months.  Alysia Penna, MD

## 2019-07-15 NOTE — Patient Instructions (Signed)
Health Maintenance Due  Topic Date Due  . TETANUS/TDAP  11/07/2017  . URINE MICROALBUMIN  04/30/2018    Depression screen [facility] 2/9 04/21/2019 04/29/2017 01/12/2016  Decreased Interest 0 0 0  Down, Depressed, Hopeless 0 0 0  PHQ - 2 Score 0 0 0

## 2019-07-21 ENCOUNTER — Other Ambulatory Visit: Payer: Self-pay | Admitting: Cardiovascular Disease

## 2019-08-10 ENCOUNTER — Telehealth: Payer: Self-pay | Admitting: Family Medicine

## 2019-08-10 NOTE — Telephone Encounter (Signed)
Left message for patient to schedule Annual Wellness Visit.  Please schedule with Nurse Health Advisor Shannon Crews, RN at [facility]  

## 2019-08-17 DIAGNOSIS — M9905 Segmental and somatic dysfunction of pelvic region: Secondary | ICD-10-CM | POA: Diagnosis not present

## 2019-08-17 DIAGNOSIS — M9901 Segmental and somatic dysfunction of cervical region: Secondary | ICD-10-CM | POA: Diagnosis not present

## 2019-08-20 DIAGNOSIS — M9901 Segmental and somatic dysfunction of cervical region: Secondary | ICD-10-CM | POA: Diagnosis not present

## 2019-08-20 DIAGNOSIS — M5134 Other intervertebral disc degeneration, thoracic region: Secondary | ICD-10-CM | POA: Diagnosis not present

## 2019-08-20 DIAGNOSIS — M5136 Other intervertebral disc degeneration, lumbar region: Secondary | ICD-10-CM | POA: Diagnosis not present

## 2019-08-20 DIAGNOSIS — M9902 Segmental and somatic dysfunction of thoracic region: Secondary | ICD-10-CM | POA: Diagnosis not present

## 2019-08-20 DIAGNOSIS — M9905 Segmental and somatic dysfunction of pelvic region: Secondary | ICD-10-CM | POA: Diagnosis not present

## 2019-08-24 DIAGNOSIS — M9901 Segmental and somatic dysfunction of cervical region: Secondary | ICD-10-CM | POA: Diagnosis not present

## 2019-08-24 DIAGNOSIS — M9905 Segmental and somatic dysfunction of pelvic region: Secondary | ICD-10-CM | POA: Diagnosis not present

## 2019-08-27 DIAGNOSIS — M9902 Segmental and somatic dysfunction of thoracic region: Secondary | ICD-10-CM | POA: Diagnosis not present

## 2019-08-27 DIAGNOSIS — M5134 Other intervertebral disc degeneration, thoracic region: Secondary | ICD-10-CM | POA: Diagnosis not present

## 2019-08-27 DIAGNOSIS — M5136 Other intervertebral disc degeneration, lumbar region: Secondary | ICD-10-CM | POA: Diagnosis not present

## 2019-08-27 DIAGNOSIS — M9901 Segmental and somatic dysfunction of cervical region: Secondary | ICD-10-CM | POA: Diagnosis not present

## 2019-08-27 DIAGNOSIS — M9905 Segmental and somatic dysfunction of pelvic region: Secondary | ICD-10-CM | POA: Diagnosis not present

## 2019-09-01 DIAGNOSIS — M5134 Other intervertebral disc degeneration, thoracic region: Secondary | ICD-10-CM | POA: Diagnosis not present

## 2019-09-01 DIAGNOSIS — M9901 Segmental and somatic dysfunction of cervical region: Secondary | ICD-10-CM | POA: Diagnosis not present

## 2019-09-01 DIAGNOSIS — M9905 Segmental and somatic dysfunction of pelvic region: Secondary | ICD-10-CM | POA: Diagnosis not present

## 2019-09-01 DIAGNOSIS — M9902 Segmental and somatic dysfunction of thoracic region: Secondary | ICD-10-CM | POA: Diagnosis not present

## 2019-09-01 DIAGNOSIS — M5136 Other intervertebral disc degeneration, lumbar region: Secondary | ICD-10-CM | POA: Diagnosis not present

## 2019-09-02 ENCOUNTER — Telehealth: Payer: Self-pay | Admitting: Family Medicine

## 2019-09-02 NOTE — Telephone Encounter (Signed)
Left message for patient to schedule Annual Wellness Visit.  Please schedule with Nurse Health Advisor Shannon Crews, RN at [facility]  

## 2019-09-04 DIAGNOSIS — M9905 Segmental and somatic dysfunction of pelvic region: Secondary | ICD-10-CM | POA: Diagnosis not present

## 2019-09-04 DIAGNOSIS — M9901 Segmental and somatic dysfunction of cervical region: Secondary | ICD-10-CM | POA: Diagnosis not present

## 2019-09-14 DIAGNOSIS — M9902 Segmental and somatic dysfunction of thoracic region: Secondary | ICD-10-CM | POA: Diagnosis not present

## 2019-09-14 DIAGNOSIS — M9905 Segmental and somatic dysfunction of pelvic region: Secondary | ICD-10-CM | POA: Diagnosis not present

## 2019-09-14 DIAGNOSIS — M5134 Other intervertebral disc degeneration, thoracic region: Secondary | ICD-10-CM | POA: Diagnosis not present

## 2019-09-14 DIAGNOSIS — M5136 Other intervertebral disc degeneration, lumbar region: Secondary | ICD-10-CM | POA: Diagnosis not present

## 2019-09-14 DIAGNOSIS — M9901 Segmental and somatic dysfunction of cervical region: Secondary | ICD-10-CM | POA: Diagnosis not present

## 2019-09-17 DIAGNOSIS — M5136 Other intervertebral disc degeneration, lumbar region: Secondary | ICD-10-CM | POA: Diagnosis not present

## 2019-09-17 DIAGNOSIS — M9901 Segmental and somatic dysfunction of cervical region: Secondary | ICD-10-CM | POA: Diagnosis not present

## 2019-09-17 DIAGNOSIS — M5134 Other intervertebral disc degeneration, thoracic region: Secondary | ICD-10-CM | POA: Diagnosis not present

## 2019-09-17 DIAGNOSIS — M9902 Segmental and somatic dysfunction of thoracic region: Secondary | ICD-10-CM | POA: Diagnosis not present

## 2019-09-17 DIAGNOSIS — M9905 Segmental and somatic dysfunction of pelvic region: Secondary | ICD-10-CM | POA: Diagnosis not present

## 2019-09-21 DIAGNOSIS — M5134 Other intervertebral disc degeneration, thoracic region: Secondary | ICD-10-CM | POA: Diagnosis not present

## 2019-09-21 DIAGNOSIS — M5136 Other intervertebral disc degeneration, lumbar region: Secondary | ICD-10-CM | POA: Diagnosis not present

## 2019-09-21 DIAGNOSIS — M9901 Segmental and somatic dysfunction of cervical region: Secondary | ICD-10-CM | POA: Diagnosis not present

## 2019-09-21 DIAGNOSIS — M9905 Segmental and somatic dysfunction of pelvic region: Secondary | ICD-10-CM | POA: Diagnosis not present

## 2019-09-21 DIAGNOSIS — M9902 Segmental and somatic dysfunction of thoracic region: Secondary | ICD-10-CM | POA: Diagnosis not present

## 2019-09-27 DIAGNOSIS — M5134 Other intervertebral disc degeneration, thoracic region: Secondary | ICD-10-CM | POA: Diagnosis not present

## 2019-09-27 DIAGNOSIS — M9905 Segmental and somatic dysfunction of pelvic region: Secondary | ICD-10-CM | POA: Diagnosis not present

## 2019-09-27 DIAGNOSIS — M5136 Other intervertebral disc degeneration, lumbar region: Secondary | ICD-10-CM | POA: Diagnosis not present

## 2019-09-27 DIAGNOSIS — M9902 Segmental and somatic dysfunction of thoracic region: Secondary | ICD-10-CM | POA: Diagnosis not present

## 2019-09-27 DIAGNOSIS — M9901 Segmental and somatic dysfunction of cervical region: Secondary | ICD-10-CM | POA: Diagnosis not present

## 2019-09-30 DIAGNOSIS — M5134 Other intervertebral disc degeneration, thoracic region: Secondary | ICD-10-CM | POA: Diagnosis not present

## 2019-09-30 DIAGNOSIS — M9901 Segmental and somatic dysfunction of cervical region: Secondary | ICD-10-CM | POA: Diagnosis not present

## 2019-09-30 DIAGNOSIS — M5136 Other intervertebral disc degeneration, lumbar region: Secondary | ICD-10-CM | POA: Diagnosis not present

## 2019-09-30 DIAGNOSIS — M9905 Segmental and somatic dysfunction of pelvic region: Secondary | ICD-10-CM | POA: Diagnosis not present

## 2019-09-30 DIAGNOSIS — M9902 Segmental and somatic dysfunction of thoracic region: Secondary | ICD-10-CM | POA: Diagnosis not present

## 2019-10-06 DIAGNOSIS — M9905 Segmental and somatic dysfunction of pelvic region: Secondary | ICD-10-CM | POA: Diagnosis not present

## 2019-10-06 DIAGNOSIS — M5134 Other intervertebral disc degeneration, thoracic region: Secondary | ICD-10-CM | POA: Diagnosis not present

## 2019-10-06 DIAGNOSIS — M9901 Segmental and somatic dysfunction of cervical region: Secondary | ICD-10-CM | POA: Diagnosis not present

## 2019-10-06 DIAGNOSIS — M5136 Other intervertebral disc degeneration, lumbar region: Secondary | ICD-10-CM | POA: Diagnosis not present

## 2019-10-06 DIAGNOSIS — M9902 Segmental and somatic dysfunction of thoracic region: Secondary | ICD-10-CM | POA: Diagnosis not present

## 2019-10-11 ENCOUNTER — Ambulatory Visit (INDEPENDENT_AMBULATORY_CARE_PROVIDER_SITE_OTHER): Payer: Medicare Other | Admitting: Family Medicine

## 2019-10-11 ENCOUNTER — Encounter: Payer: Self-pay | Admitting: Family Medicine

## 2019-10-11 ENCOUNTER — Other Ambulatory Visit: Payer: Self-pay

## 2019-10-11 VITALS — BP 138/78 | HR 74 | Temp 98.6°F | Ht 74.0 in | Wt 224.0 lb

## 2019-10-11 DIAGNOSIS — Z23 Encounter for immunization: Secondary | ICD-10-CM | POA: Diagnosis not present

## 2019-10-11 DIAGNOSIS — E119 Type 2 diabetes mellitus without complications: Secondary | ICD-10-CM

## 2019-10-11 LAB — POCT GLYCOSYLATED HEMOGLOBIN (HGB A1C): Hemoglobin A1C: 8.4 % — AB (ref 4.0–5.6)

## 2019-10-11 NOTE — Patient Instructions (Signed)
A1C not well controlled today at 8.4%- but likely reflects being off the Trulicity  Let's plan on 3 month follow up.

## 2019-10-11 NOTE — Progress Notes (Signed)
Established Patient Office Visit  Subjective:  Patient ID: Todd [name], male    DOB: December 22, 1945  Age: 74 y.o. MRN: [id number]  CC:  Chief Complaint  Patient presents with  . Diabetes    Doing well    HPI Todd [name] presents for follow-up type 2 diabetes.  Patient had recent urologic procedure and was off his Trulicity for 6 weeks.  He is just starting back at this time.  He states he knew his blood sugars will be "up" today.  He had been doing well on Trulicity with no side effects.  He also remains on Metformin and has been consistently taking this.  Has dyslipidemia and is treated with fenofibrate and rosuvastatin.  Overdue for follow-up labs.  He is nonfasting today.  Requesting flu vaccine.  Past Medical History:  Diagnosis Date  . DIABETES MELLITUS, TYPE II 08/08/2009  . Hiatal hernia   . History of nuclear stress test    Myoview 02/2019: EF 54, apical inf and apical artifact, normal perfusion, low risk  . HYPERLIPIDEMIA 08/08/2009  . Palpitations   . URINARY INCONTINENCE 08/08/2009    Past Surgical History:  Procedure Laterality Date  . ACHILLES TENDON REPAIR  2002   rupture  . APPENDECTOMY  1976  . CARDIOVASCULAR STRESS TEST  11-02-2003   EF 57%  . TONSILLECTOMY  1961  . US ECHOCARDIOGRAPHY  02-10-2001   EF 60-65%    Family History  Problem Relation Age of Onset  . Mental illness Mother   . Diabetes Sister        type ll    Social History   Socioeconomic History  . Marital status: Married    Spouse name: Not on file  . Number of children: Not on file  . Years of education: Not on file  . Highest education level: Not on file  Occupational History  . Not on file  Tobacco Use  . Smoking status: Former Smoker    Packs/day: 2.00    Years: 10.00    Pack years: 20.00    Types: Cigarettes    Quit date: 04/19/1977    Years since quitting: 42.5  . Smokeless tobacco: Never Used  . Tobacco comment: discussed AAA but think he has had this  Vaping Use  .  Vaping Use: Never used  Substance and Sexual Activity  . Alcohol use: Yes    Comment:  glass of wine x 2 per week  . Drug use: No  . Sexual activity: Not on file  Other Topics Concern  . Not on file  Social History Narrative  . Not on file   Social Determinants of Health   Financial Resource Strain:   . Difficulty of Paying Living Expenses: Not on file  Food Insecurity:   . Worried About Charity fundraiser in the Last Year: Not on file  . Ran Out of Food in the Last Year: Not on file  Transportation Needs:   . Lack of Transportation (Medical): Not on file  . Lack of Transportation (Non-Medical): Not on file  Physical Activity:   . Days of Exercise per Week: Not on file  . Minutes of Exercise per Session: Not on file  Stress:   . Feeling of Stress : Not on file  Social Connections:   . Frequency of Communication with Friends and Family: Not on file  . Frequency of Social Gatherings with Friends and Family: Not on file  . Attends Religious Services: Not on file  .  Active Member of Clubs or Organizations: Not on file  . Attends Archivist Meetings: Not on file  . Marital Status: Not on file  Intimate Partner Violence:   . Fear of Current or Ex-Partner: Not on file  . Emotionally Abused: Not on file  . Physically Abused: Not on file  . Sexually Abused: Not on file    Outpatient Medications Prior to Visit  Medication Sig Dispense Refill  . Coenzyme Q10 (CO Q 10 PO) Take 200 mg by mouth daily.    . fenofibrate 160 MG tablet TAKE 1 TABLET BY MOUTH  DAILY 90 tablet 1  . metFORMIN (GLUCOPHAGE) 1000 MG tablet TAKE 1 TABLET BY MOUTH  TWICE DAILY WITH MEALS 180 tablet 1  . pantoprazole (PROTONIX) 20 MG tablet Take 1 tablet (20 mg total) by mouth daily. 30 tablet 0  . polycarbophil (FIBERCON) 625 MG tablet Take 625 mg by mouth daily. Taking 2 Tablet    . rosuvastatin (CRESTOR) 10 MG tablet TAKE 1 TABLET BY MOUTH  DAILY 90 tablet 1  . TRULICITY 1.61 WR/6.0AV SOPN INJECT  0.75 MG INTO THE  SKIN ONCE A WEEK. 6 mL 3  . vitamin B-12 (CYANOCOBALAMIN) 1000 MCG tablet Take 1,000 mcg by mouth daily.    Marland Kitchen VITAMIN D, ERGOCALCIFEROL, PO Take 52 mcg by mouth daily.    Marland Kitchen alfuzosin (UROXATRAL) 10 MG 24 hr tablet Take 10 mg by mouth 2 (two) times daily.     No facility-administered medications prior to visit.    Allergies  Allergen Reactions  . Jardiance [Empagliflozin]     Yeast infections  . Sudafed Pe Sinus Cong Day-Nght  [Diphenhydramine-Phenylephrine] Other (See Comments)  . Actifed Cold-Sinus     rash  . Lisinopril Swelling  . Pseudoephedrine     REACTION: hives    ROS Review of Systems  Constitutional: Negative for chills and fever.  Endocrine: Negative for polydipsia and polyuria.      Objective:    Physical Exam Vitals reviewed.  Constitutional:      Appearance: Normal appearance.  Cardiovascular:     Rate and Rhythm: Normal rate and regular rhythm.  Pulmonary:     Effort: Pulmonary effort is normal.     Breath sounds: Normal breath sounds.  Neurological:     Mental Status: He is alert.     BP 138/78   Pulse 74   Temp 98.6 F (37 C) (Oral)   Ht 6\' 2"  (1.88 m)   Wt 224 lb (101.6 kg)   SpO2 96%   BMI 28.76 kg/m  Wt Readings from Last 3 Encounters:  10/11/19 224 lb (101.6 kg)  07/15/19 223 lb (101.2 kg)  04/21/19 222 lb 3.2 oz (100.8 kg)     Health Maintenance Due  Topic Date Due  . TETANUS/TDAP  11/07/2017  . URINE MICROALBUMIN  04/30/2018    There are no preventive care reminders to display for this patient.  Lab Results  Component Value Date   TSH 0.80 09/08/2013   Lab Results  Component Value Date   WBC 5.8 02/26/2013   HGB 14.2 02/26/2013   HCT 44.0 02/26/2013   MCV 88.6 02/26/2013   PLT 252.0 02/26/2013   Lab Results  Component Value Date   NA 138 06/11/2018   K 4.5 06/11/2018   CO2 28 06/11/2018   GLUCOSE 176 (H) 06/11/2018   BUN 16 06/11/2018   CREATININE 1.01 06/11/2018   BILITOT 0.3 06/11/2018    ALKPHOS 53 06/11/2018  AST 15 06/11/2018   ALT 31 06/11/2018   PROT 7.5 06/11/2018   ALBUMIN 4.5 06/11/2018   CALCIUM 10.0 06/11/2018   GFR 87.93 06/11/2018   Lab Results  Component Value Date   CHOL 148 06/11/2018   Lab Results  Component Value Date   HDL 40.80 06/11/2018   Lab Results  Component Value Date   LDLCALC 72 01/14/2018   Lab Results  Component Value Date   TRIG 218.0 (H) 06/11/2018   Lab Results  Component Value Date   CHOLHDL 4 06/11/2018   Lab Results  Component Value Date   HGBA1C 8.4 (A) 10/11/2019      Assessment & Plan:   Type 2 diabetes.  Suboptimally controlled with A1c today 8.4% but patient off Trulicity past 6 weeks  -Start back Trulicity 7.94 mg once weekly and continue Metformin.  Will plan 80-month follow-up.  If A1c not close to goal at that point consider either further titration of Trulicity or possible additional medication.  He has had previous intolerance with Jardiance  Also needs follow-up labs for lipids.  He will plan to get early morning appointment at follow-up and obtain lipid panel then   No orders of the defined types were placed in this encounter.   Follow-up: Return in about 3 months (around 01/11/2020).    Carolann Littler, MD

## 2019-10-12 ENCOUNTER — Other Ambulatory Visit: Payer: Self-pay | Admitting: Family Medicine

## 2019-10-13 DIAGNOSIS — M5134 Other intervertebral disc degeneration, thoracic region: Secondary | ICD-10-CM | POA: Diagnosis not present

## 2019-10-13 DIAGNOSIS — M9905 Segmental and somatic dysfunction of pelvic region: Secondary | ICD-10-CM | POA: Diagnosis not present

## 2019-10-13 DIAGNOSIS — M9902 Segmental and somatic dysfunction of thoracic region: Secondary | ICD-10-CM | POA: Diagnosis not present

## 2019-10-13 DIAGNOSIS — M5136 Other intervertebral disc degeneration, lumbar region: Secondary | ICD-10-CM | POA: Diagnosis not present

## 2019-10-13 DIAGNOSIS — M9901 Segmental and somatic dysfunction of cervical region: Secondary | ICD-10-CM | POA: Diagnosis not present

## 2019-10-20 ENCOUNTER — Telehealth: Payer: Self-pay | Admitting: Family Medicine

## 2019-10-20 DIAGNOSIS — M9902 Segmental and somatic dysfunction of thoracic region: Secondary | ICD-10-CM | POA: Diagnosis not present

## 2019-10-20 DIAGNOSIS — M5136 Other intervertebral disc degeneration, lumbar region: Secondary | ICD-10-CM | POA: Diagnosis not present

## 2019-10-20 DIAGNOSIS — M5134 Other intervertebral disc degeneration, thoracic region: Secondary | ICD-10-CM | POA: Diagnosis not present

## 2019-10-20 DIAGNOSIS — M9905 Segmental and somatic dysfunction of pelvic region: Secondary | ICD-10-CM | POA: Diagnosis not present

## 2019-10-20 DIAGNOSIS — M9901 Segmental and somatic dysfunction of cervical region: Secondary | ICD-10-CM | POA: Diagnosis not present

## 2019-10-20 NOTE — Progress Notes (Signed)
  Chronic Care Management   Note  10/20/2019 Name: Todd [name] MRN: [id number] DOB: Dec 22, 1945  [name] is a 74 y.o. year old male who is a primary care patient of Burchette, Alinda Sierras, MD. I reached out to [facility] [name] by phone today in response to a referral sent by [name]'s PCP, Burchette, Alinda Sierras, MD.   [name] was given information about Chronic Care Management services today including:  1. CCM service includes personalized support from designated clinical staff supervised by his physician, including individualized plan of care and coordination with other care providers 2. 24/7 contact phone numbers for assistance for urgent and routine care needs. 3. Service will only be billed when office clinical staff spend 20 minutes or more in a month to coordinate care. 4. Only one practitioner may furnish and bill the service in a calendar month. 5. The patient may stop CCM services at any time (effective at the end of the month) by phone call to the office staff.   Patient agreed to services and verbal consent obtained.   Follow up plan:   Carley Perdue [employer] Scheduler

## 2019-10-20 NOTE — Progress Notes (Signed)
  Chronic Care Management   Outreach Note  10/20/2019 Name: Todd [name] MRN: [id number] DOB: 1945-11-12  Referred by: [name], MD Reason for referral : No chief complaint on file.   An unsuccessful telephone outreach was attempted today. The patient was referred to the pharmacist for assistance with care management and care coordination.   Follow Up Plan:   Carley Perdue [employer] Scheduler

## 2019-10-27 DIAGNOSIS — M9905 Segmental and somatic dysfunction of pelvic region: Secondary | ICD-10-CM | POA: Diagnosis not present

## 2019-10-27 DIAGNOSIS — M9901 Segmental and somatic dysfunction of cervical region: Secondary | ICD-10-CM | POA: Diagnosis not present

## 2019-10-27 DIAGNOSIS — M5134 Other intervertebral disc degeneration, thoracic region: Secondary | ICD-10-CM | POA: Diagnosis not present

## 2019-10-27 DIAGNOSIS — M9902 Segmental and somatic dysfunction of thoracic region: Secondary | ICD-10-CM | POA: Diagnosis not present

## 2019-10-27 DIAGNOSIS — M5136 Other intervertebral disc degeneration, lumbar region: Secondary | ICD-10-CM | POA: Diagnosis not present

## 2019-11-02 DIAGNOSIS — M9901 Segmental and somatic dysfunction of cervical region: Secondary | ICD-10-CM | POA: Diagnosis not present

## 2019-11-02 DIAGNOSIS — M9905 Segmental and somatic dysfunction of pelvic region: Secondary | ICD-10-CM | POA: Diagnosis not present

## 2019-11-09 DIAGNOSIS — M9901 Segmental and somatic dysfunction of cervical region: Secondary | ICD-10-CM | POA: Diagnosis not present

## 2019-11-09 DIAGNOSIS — M9905 Segmental and somatic dysfunction of pelvic region: Secondary | ICD-10-CM | POA: Diagnosis not present

## 2019-11-10 ENCOUNTER — Telehealth: Payer: Self-pay | Admitting: Family Medicine

## 2019-11-10 NOTE — Progress Notes (Signed)
  Chronic Care Management   Outreach Note  11/10/2019 Name: Todd [name] MRN: [id number] DOB: [birth year]  Referred by: [name], MD Reason for referral : No chief complaint on file.   An unsuccessful telephone outreach was attempted today. The patient was referred to the pharmacist for assistance with care management and care coordination.   Follow Up Plan:   Carley Perdue [employer] Scheduler

## 2019-11-15 ENCOUNTER — Telehealth: Payer: Self-pay | Admitting: Pharmacist

## 2019-11-16 ENCOUNTER — Telehealth: Payer: Self-pay | Admitting: *Deleted

## 2019-11-16 DIAGNOSIS — E119 Type 2 diabetes mellitus without complications: Secondary | ICD-10-CM

## 2019-11-16 DIAGNOSIS — R32 Unspecified urinary incontinence: Secondary | ICD-10-CM

## 2019-11-16 DIAGNOSIS — M5010 Cervical disc disorder with radiculopathy, unspecified cervical region: Secondary | ICD-10-CM

## 2019-11-16 DIAGNOSIS — I10 Essential (primary) hypertension: Secondary | ICD-10-CM

## 2019-11-16 DIAGNOSIS — E782 Mixed hyperlipidemia: Secondary | ICD-10-CM

## 2019-11-16 NOTE — Telephone Encounter (Signed)
-----   Message from Viona Gilmore, [facility] sent at 11/16/2019  1:40 PM EST ----- Regarding: CCM referral Good afternoon Apolonio Schneiders,  Can you please put in a CCM referral for Mr. Todd [name]? He is a patient of Dr. Elease Hashimoto.  Thank you, Maddie

## 2019-11-16 NOTE — Chronic Care Management (AMB) (Signed)
    Chronic Care Management Pharmacy Assistant   Name: Todd [name]  MRN: [id number] DOB: 12/18/1945  Reason for Encounter: Medication Review/Initial Questions for Pharmacist visit on 11/17/2019  Patient Questions: 1. Have you seen any other providers since your last visit? No 2. Any changes in your medications or health? No 3. Any side effects from any medications? No 4. Do you have any symptoms or problems not managed by your medications?  No 5. Any concerns about your health right now? No 6. Has your provider asked that you check blood pressure, blood sugar, or follow a special diet at home? No 7. Do you get any type of exercise regularly? No 8. Can you think of a goal you would like to reach for your health? He would like to lose about 10 lbs. 9. Do you have any problems getting your medications? No  10. Is there anything that you would like to discuss during the appointment? No  The patient was asked to please bring medications, blood pressure/ blood sugar log, and supplements to your appointment.  PCP : Todd Post, MD  Allergies:   Allergies  Allergen Reactions  . Jardiance [Empagliflozin]     Yeast infections  . Sudafed Pe Sinus Cong Day-Nght  [Diphenhydramine-Phenylephrine] Other (See Comments)  . Actifed Cold-Sinus     rash  . Lisinopril Swelling  . Pseudoephedrine     REACTION: hives    Medications: Outpatient Encounter Medications as of 11/15/2019  Medication Sig  . Coenzyme Q10 (CO Q 10 PO) Take 200 mg by mouth daily.  . fenofibrate 160 MG tablet TAKE 1 TABLET BY MOUTH  DAILY  . metFORMIN (GLUCOPHAGE) 1000 MG tablet TAKE 1 TABLET BY MOUTH  TWICE DAILY WITH MEALS  . pantoprazole (PROTONIX) 20 MG tablet Take 1 tablet (20 mg total) by mouth daily.  . polycarbophil (FIBERCON) 625 MG tablet Take 625 mg by mouth daily. Taking 2 Tablet  . rosuvastatin (CRESTOR) 10 MG tablet TAKE 1 TABLET BY MOUTH  DAILY  . TRULICITY 9.93 TT/0.1XB SOPN INJECT 0.75 MG INTO THE   SKIN WEEKLY  . vitamin B-12 (CYANOCOBALAMIN) 1000 MCG tablet Take 1,000 mcg by mouth daily.  Marland Kitchen VITAMIN D, ERGOCALCIFEROL, PO Take 52 mcg by mouth daily.   No facility-administered encounter medications on file as of 11/15/2019.    Current Diagnosis: Patient Active Problem List   Diagnosis Date Noted  . Mixed hyperlipidemia 08/26/2016  . 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 Shoulder pain, bilateral 06/16/2013  . Neck strain 10/03/2012  . Chest discomfort 04/24/2012  . HTN (hypertension) 02/13/2012  . Right ankle pain 10/23/2011  . Controlled type 2 diabetes mellitus without complication, without long-term current use of insulin ([place]) 08/08/2009  . Dyslipidemia 08/08/2009  . URINARY INCONTINENCE 08/08/2009    Goals Addressed   None     Follow-Up:  Pharmacist Review   Maia Breslow, [place] Assistant [phone]

## 2019-11-17 ENCOUNTER — Ambulatory Visit: Payer: Medicare Other | Admitting: Pharmacist

## 2019-11-17 DIAGNOSIS — M9902 Segmental and somatic dysfunction of thoracic region: Secondary | ICD-10-CM | POA: Diagnosis not present

## 2019-11-17 DIAGNOSIS — M9905 Segmental and somatic dysfunction of pelvic region: Secondary | ICD-10-CM | POA: Diagnosis not present

## 2019-11-17 DIAGNOSIS — M5134 Other intervertebral disc degeneration, thoracic region: Secondary | ICD-10-CM | POA: Diagnosis not present

## 2019-11-17 DIAGNOSIS — E119 Type 2 diabetes mellitus without complications: Secondary | ICD-10-CM

## 2019-11-17 DIAGNOSIS — E782 Mixed hyperlipidemia: Secondary | ICD-10-CM

## 2019-11-17 DIAGNOSIS — M5136 Other intervertebral disc degeneration, lumbar region: Secondary | ICD-10-CM | POA: Diagnosis not present

## 2019-11-17 DIAGNOSIS — M9901 Segmental and somatic dysfunction of cervical region: Secondary | ICD-10-CM | POA: Diagnosis not present

## 2019-11-17 NOTE — Chronic Care Management (AMB) (Signed)
Chronic Care Management Pharmacy  Name: Todd [name]  MRN: [id number] DOB: December 17, 1945  Initial Planning Appointment: completed 11/16/19  Initial Questions: 1. Have you seen any other providers since your last visit? n/a 2. Any changes in your medicines or health? No   Chief Complaint/ HPI  Todd [name],  74 y.o. , male presents for their Initial CCM visit with the clinical pharmacist via telephone due to COVID-19 Pandemic.  PCP : [name], MD  Their chronic conditions include: DM, HTN, HLD, GERD  Office Visits: -10/11/19 Carolann Littler, MD: Patient presented for DM follow up. A1C increased to 8.4% as patient has been off Trulicity for 6 weeks. Plan to increase Trulicity at next visit if A1C is not close to goal or additional medication. Patient overdue for lipid panel and will be rechecked at that time. Influenza vaccine given. Follow up in 3 months.   07/15/19 Alysia Penna, MD: Patient presented for cerumen impaction irrigation. A1C increased to 8.1% and patient was off Trulicity for 2 months. Plan to restart Trulicity and follow up with Dr. Elease Hashimoto in 3 months.   Consult Visit: -05/06/19 Brayton Layman (urology): Patient presented for follow up. Unable to access notes.  -04/15/19 Ernie Hew (physical medicine and rehab): Patient presented for steroid injection for back. Unable to access notes.  Medications: Outpatient Encounter Medications as of 11/17/2019  Medication Sig  . Coenzyme Q10 (CO Q 10 PO) Take 200 mg by mouth daily.  . fenofibrate 160 MG tablet TAKE 1 TABLET BY MOUTH  DAILY  . metFORMIN (GLUCOPHAGE) 1000 MG tablet TAKE 1 TABLET BY MOUTH  TWICE DAILY WITH MEALS  . pantoprazole (PROTONIX) 20 MG tablet Take 1 tablet (20 mg total) by mouth daily.  . polycarbophil (FIBERCON) 625 MG tablet Take 625 mg by mouth daily. Taking 2 Tablet  . rosuvastatin (CRESTOR) 10 MG tablet TAKE 1 TABLET BY MOUTH  DAILY  . TRULICITY 9.14 NW/2.9FA SOPN INJECT 0.75 MG INTO THE  SKIN  WEEKLY  . vitamin B-12 (CYANOCOBALAMIN) 1000 MCG tablet Take 1,000 mcg by mouth daily.  Marland Kitchen VITAMIN D, ERGOCALCIFEROL, PO Take 2,000 Units by mouth daily.    No facility-administered encounter medications on file as of 11/17/2019.   Patient works at a [employer] and does phone work and client work and sits at [employer] for a majority of the day. He current lives with his wife and a small dog and has a daughter that lives nearby with grandkids. He does not do as much exercise as he'd like due to time and back pain, but does love to walk and bike.  Patient and his wife eat out a few times a week but mostly eats at home. Last night he had pasta and sometimes eats steak but fish and chicken are his preferences. He eats a lot of salads and greens and salads and a starch and he eats some sweets such as gelato 3-4 times a week.   Patient reports he gets an adequate amount of sleep about 7-8 hours per night. He denies troubles falling or staying asleep and does take occasional naps but they are not planned.   He denies any current problems with his medications.   Current Diagnosis/Assessment:  Goals Addressed            This Visit's Progress   . Pharmacy Care Plan       CARE PLAN ENTRY (see longitudinal plan of care for additional care plan information)  Current Barriers:  .  Chronic Disease Management support, education, and care coordination needs related to Hypertension, Hyperlipidemia, Diabetes, and GERD   Hypertension BP Readings from Last 3 Encounters:  10/11/19 138/78  07/15/19 122/70  04/21/19 118/72   . Pharmacist Clinical Goal(s): o Over the next 180 days, patient will work with PharmD and providers to maintain BP goal <130/80 . Current regimen:  o No medications . Interventions: o Discussed DASH eating plan recommendations: . Emphasizes vegetables, fruits, and whole-grains . Includes fat-free or low-fat dairy products, fish, poultry, beans, nuts, and vegetable  oils . Limits foods that are high in saturated fat. These foods include fatty meats, full-fat dairy products, and tropical oils such as coconut, palm kernel, and palm oils. . Limits sugar-sweetened beverages and sweets . Limiting sodium intake to < 1500 mg/day . Patient self care activities - Over the next 180 days, patient will: o Check blood pressure as directed, document, and provide at future appointments o Ensure daily salt intake < 2300 mg/day  Hyperlipidemia Lab Results  Component Value Date/Time   LDLCALC 72 01/14/2018 11:46 AM   LDLDIRECT 84.0 06/11/2018 10:52 AM   . Pharmacist Clinical Goal(s): o Over the next 180 days, patient will work with PharmD and providers to maintain LDL goal < 100 . Current regimen:  . Rosuvastatin 10 mg 1 tablet daily . Fenofibrate 160 mg 1 tablet daily . Interventions: o Discussed how triglycerides can increase when: . Eating eat too much food . Eating high-fat foods such as fried foods, red meat, chicken skin, egg yolks, high-fat dairy, butter, lard, shortening, margarine, and fast food . Eating foods high in simple carbohydrates such as fresh and canned fruit, candy, ice cream and sweetened yogurt, sweetened drinks like juices, cereal, jams, foods and drinks with corn syrup, or sugar listed as the first ingredient . Drinking alcohol   . Patient self care activities - Over the next 180 days, patient will: o Continue current medications  Diabetes Lab Results  Component Value Date/Time   HGBA1C 8.4 (A) 10/11/2019 03:18 PM   HGBA1C 8.1 (A) 07/15/2019 10:34 AM   HGBA1C 7.4 (A) 08/31/2018 04:03 PM   HGBA1C 9.4 (H) 06/11/2018 10:55 AM   HGBA1C 8.5 (H) 04/29/2017 02:32 PM   . Pharmacist Clinical Goal(s): o Over the next 180 days, patient will work with PharmD and providers to achieve A1c goal <7% . Current regimen:  . Trulicity 0.75 mg inject once weekly . Metformin 1000 mg 1 tablet twice daily . Interventions: o Discussed recommendations for  moderate aerobic exercise for 150 minutes/week spread out over 5 days for heart healthy lifestyle o Discussed following the healthy plate method which includes: . Fill half of your plate with nonstarchy vegetables, such as spinach, broccoli, carrots and tomatoes. Lenox Ahr a quarter of your plate with a protein, such as tuna, lean pork or chicken. Lenox Ahr the last quarter with a whole-grain item, such as brown rice, or a starchy vegetable, such as green peas or potatoes. . Include "good" fats such as nuts or avocados in small amounts. . Patient self care activities - Over the next 180 days, patient will: o Check blood sugar as directed, document, and provide at future appointments o Contact provider with any episodes of hypoglycemia  GERD . Pharmacist Clinical Goal(s) o Over the next 180 days, patient will work with PharmD and providers to manage symptoms of heartburn . Current regimen:  o Pantoprazole 20 mg 1 tablet daily  . Interventions: o Discussed non-pharmacologic management of symptoms such  as elevating the head of your bed, avoiding eating 2-3 hours before bed, avoiding triggering foods such as acidic, spicy, or fatty foods, eating smaller meals, and wearing clothes that are loose around the waist  . Patient self care activities - Over the next 180 days, patient will: o Continue current medications  Medication management . Pharmacist Clinical Goal(s): o Over the next 180 days, patient will work with PharmD and providers to achieve optimal medication adherence . Current pharmacy: [facility] . Interventions o Comprehensive medication review performed. o Continue current medication management strategy . Patient self care activities - Over the next 180 days, patient will: o Take medications as prescribed o Report any questions or concerns to PharmD and/or provider(s)  Initial goal documentation       SDOH Interventions     Most Recent Value  SDOH Interventions  Financial Strain  Interventions Intervention Not Indicated  Transportation Interventions Intervention Not Indicated      Diabetes   A1c goal <7%   Recent Relevant Labs: Lab Results  Component Value Date/Time   HGBA1C 8.4 (A) 10/11/2019 03:18 PM   HGBA1C 8.1 (A) 07/15/2019 10:34 AM   HGBA1C 7.4 (A) 08/31/2018 04:03 PM   HGBA1C 9.4 (H) 06/11/2018 10:55 AM   HGBA1C 8.5 (H) 04/29/2017 02:32 PM   GFR 87.93 06/11/2018 10:52 AM   GFR 98.23 04/29/2017 02:32 PM   MICROALBUR <0.7 04/29/2017 02:32 PM   MICROALBUR 0.5 01/12/2016 09:54 AM    Last diabetic Eye exam:  Lab Results  Component Value Date/Time   HMDIABEYEEXA No Retinopathy 01/27/2019 12:00 AM    Last diabetic Foot exam:  Lab Results  Component Value Date/Time   HMDIABFOOTEX  normal 09/19/2014 12:00 AM     Checking BG: Never  Patient has failed these meds in past: Jardiance (cost) Patient is currently uncontrolled on the following medications: . Trulicity 8.52 mg inject once weekly . Metformin 1000 mg 1 tablet twice daily  We discussed: diet and exercise extensively and how to recognize and treat signs of hypoglycemia  -Denies symptoms of hypoglycemia -Diet: drinking 2-3 bottles of water; denies drinking soda and sweet tea -Discussed recommendations for moderate aerobic exercise for 150 minutes/week spread out over 5 days for heart healthy lifestyle -Discussed cost of Trulicity (paying $[phone] every 90 days) but does not qualify for PAP based on current income   Plan Formulary investigation showed same cost for Trulicity as alternative GLP1 therapy. Continue current medications  Hypertension   BP goal is:  <140/90  Office blood pressures are  BP Readings from Last 3 Encounters:  10/11/19 138/78  07/15/19 122/70  04/21/19 118/72   Patient checks BP at home never Patient home BP readings are ranging: n/a  Patient has failed these meds in the past: lisinopril (no longer needed) Patient is currently controlled on the  following medications:  . No medications  We discussed diet and exercise extensively  Plan  Continue control with diet and exercise     Hyperlipidemia   LDL goal < 100  Last lipids Lab Results  Component Value Date   CHOL 148 06/11/2018   HDL 40.80 06/11/2018   LDLCALC 72 01/14/2018   LDLDIRECT 84.0 06/11/2018   TRIG 218.0 (H) 06/11/2018   CHOLHDL 4 06/11/2018   Hepatic Function Latest Ref Rng & Units 06/11/2018 01/14/2018 04/29/2017  Total Protein 6.0 - 8.3 g/dL 7.5 7.2 7.6  Albumin 3.5 - 5.2 g/dL 4.5 4.5 4.7  AST 0 - 37 U/L $Remo'15 17 17  'uVZxg$ ALT  0 - 53 U/L $Remo'31 28 30  'pItdk$ Alk Phosphatase 39 - 117 U/L 53 60 56  Total Bilirubin 0.2 - 1.2 mg/dL 0.3 <0.2 0.4  Bilirubin, Direct 0.0 - 0.3 mg/dL 0.1 0.08 -     The 10-year ASCVD risk score Mikey Bussing DC Jr., et al., 2013) is: 25.5%   Values used to calculate the score:     Age: 89 years     Sex: Male     Is Non-Hispanic African American: Yes     Diabetic: Yes     Tobacco smoker: No     Systolic Blood Pressure: 478 mmHg     Is BP treated: No     HDL Cholesterol: 40.8 mg/dL     Total Cholesterol: 148 mg/dL   Patient has failed these meds in past: none Patient is currently uncontrolled on the following medications:  . Rosuvastatin 10 mg 1 tablet daily . Fenofibrate 160 mg 1 tablet daily  We discussed:  diet and exercise extensively  -Discussed how triglycerides can increase when: . Eating eat too much food . Eating high-fat foods such as fried foods, red meat, chicken skin, egg yolks, high-fat dairy, butter, lard, shortening, margarine, and fast food . Eating foods high in simple carbohydrates such as fresh and canned fruit, candy, ice cream and sweetened yogurt, sweetened drinks like juices, cereal, jams, foods and drinks with corn syrup, or sugar listed as the first ingredient . Drinking alcohol    Plan Consider Vascepa for additional TG lowering given DM diagnosis and ASCVD risk of 25.5%.  Continue current medications   GERD    Patient has failed these meds in past: none Patient is currently controlled on the following medications:  . Pantoprazole 20 mg 1 tablet daily   We discussed:  Non-pharmacologic management of symptoms such as elevating the head of your bed, avoiding eating 2-3 hours before bed, avoiding triggering foods such as acidic, spicy, or fatty foods, eating smaller meals, and wearing clothes that are loose around the waist; recommended Tums PRN for breakthrough heartburn   Plan  Continue current medications    Vitamin/Fiber   Last vitamin D No results found for: 25OHVITD2, 25OHVITD3, VD25OH   Vitamin D - level  Patient is currently on the following medications:  Marland Kitchen Vitamin B12 1000 mcg 1 tablet daily . Vitamin D 2000 units 1 tablet daily  . CoQ10 200 mg 1 tablet daily . Fibercon 625 mg 1 tablet daily   We discussed:  Fat soluble vs water soluble vitamins  Plan Recommend vitamin D level.  Continue current medications  Vaccines   Reviewed and discussed patient's vaccination history.    Immunization History  Administered Date(s) Administered  . Fluad Quad(high Dose 65+) 08/31/2018, 10/11/2019  . Influenza Split 10/08/2010, 10/14/2011  . Influenza Whole 09/22/2009  . Influenza, High Dose Seasonal PF 09/25/2015, 10/11/2016  . Influenza,inj,Quad PF,6+ Mos 10/02/2012, 09/06/2013, 09/19/2014, 09/29/2017  . PFIZER SARS-COV-2 Vaccination 02/13/2019, 03/06/2019  . Pneumococcal Conjugate-13 12/14/2013  . Pneumococcal Polysaccharide-23 10/14/2011  . Tdap 11/08/2007  . Zoster 11/08/2007    Plan  Recommended patient receive tetanus and Shingles vaccine in office/at pharmacy.   Medication Management   Pt [facility] for all medications Uses pill box? Yes - weekly Pt endorses 100% compliance  We discussed: Current pharmacy is preferred with insurance plan and patient is satisfied with pharmacy services  Plan  Continue current medication management  strategy   Follow up: 6 month phone visit  (prefers 2 pm)  Jeni Salles, PharmD Clinical Pharmacist [facility]

## 2019-11-24 DIAGNOSIS — M9902 Segmental and somatic dysfunction of thoracic region: Secondary | ICD-10-CM | POA: Diagnosis not present

## 2019-11-24 DIAGNOSIS — M9901 Segmental and somatic dysfunction of cervical region: Secondary | ICD-10-CM | POA: Diagnosis not present

## 2019-11-24 DIAGNOSIS — M5136 Other intervertebral disc degeneration, lumbar region: Secondary | ICD-10-CM | POA: Diagnosis not present

## 2019-11-24 DIAGNOSIS — M5134 Other intervertebral disc degeneration, thoracic region: Secondary | ICD-10-CM | POA: Diagnosis not present

## 2019-11-24 DIAGNOSIS — M9905 Segmental and somatic dysfunction of pelvic region: Secondary | ICD-10-CM | POA: Diagnosis not present

## 2019-12-01 DIAGNOSIS — M9901 Segmental and somatic dysfunction of cervical region: Secondary | ICD-10-CM | POA: Diagnosis not present

## 2019-12-01 DIAGNOSIS — M9905 Segmental and somatic dysfunction of pelvic region: Secondary | ICD-10-CM | POA: Diagnosis not present

## 2019-12-04 NOTE — Patient Instructions (Addendum)
Hi Todd [name],  It was lovely to get to meet you over the phone! Below is a summary of some of the topics we discussed. I also looked into the price of the Trulicity and other equivalent medications and you would be paying the same amount if you switched to alternative one unfortunately. Please reach out if your income changes in the future as we can explore patient assistance options at that time! I apologize I couldn't be more helpful with the cost of medications.  Please don't hesitate to give me a call if you have questions or need anything before our follow up in 6 months!  Best, Maddie  Jeni Salles, PharmD [facility] Clinical Pharmacist [facility] [place]   Visit Information  Goals Addressed            This Visit's Progress   . Pharmacy Care Plan       CARE PLAN ENTRY (see longitudinal plan of care for additional care plan information)  Current Barriers:  . Chronic Disease Management support, education, and care coordination needs related to Hypertension, Hyperlipidemia, Diabetes, and GERD   Hypertension BP Readings from Last 3 Encounters:  10/11/19 138/78  07/15/19 122/70  04/21/19 118/72   . Pharmacist Clinical Goal(s): o Over the next 180 days, patient will work with PharmD and providers to maintain BP goal <130/80 . Current regimen:  o No medications . Interventions: o Discussed DASH eating plan recommendations: . Emphasizes vegetables, fruits, and whole-grains . Includes fat-free or low-fat dairy products, fish, poultry, beans, nuts, and vegetable oils . Limits foods that are high in saturated fat. These foods include fatty meats, full-fat dairy products, and tropical oils such as coconut, palm kernel, and palm oils. . Limits sugar-sweetened beverages and sweets . Limiting sodium intake to < 1500 mg/day . Patient self care activities - Over the next 180 days, patient will: o Check blood pressure as directed, document, and provide at future  appointments o Ensure daily salt intake < 2300 mg/day  Hyperlipidemia Lab Results  Component Value Date/Time   LDLCALC 72 01/14/2018 11:46 AM   LDLDIRECT 84.0 06/11/2018 10:52 AM   . Pharmacist Clinical Goal(s): o Over the next 180 days, patient will work with PharmD and providers to maintain LDL goal < 100 . Current regimen:  . Rosuvastatin 10 mg 1 tablet daily . Fenofibrate 160 mg 1 tablet daily . Interventions: o Discussed how triglycerides can increase when: . Eating eat too much food . Eating high-fat foods such as fried foods, red meat, chicken skin, egg yolks, high-fat dairy, butter, lard, shortening, margarine, and fast food . Eating foods high in simple carbohydrates such as fresh and canned fruit, candy, ice cream and sweetened yogurt, sweetened drinks like juices, cereal, jams, foods and drinks with corn syrup, or sugar listed as the first ingredient . Drinking alcohol   . Patient self care activities - Over the next 180 days, patient will: o Continue current medications  Diabetes Lab Results  Component Value Date/Time   HGBA1C 8.4 (A) 10/11/2019 03:18 PM   HGBA1C 8.1 (A) 07/15/2019 10:34 AM   HGBA1C 7.4 (A) 08/31/2018 04:03 PM   HGBA1C 9.4 (H) 06/11/2018 10:55 AM   HGBA1C 8.5 (H) 04/29/2017 02:32 PM   . Pharmacist Clinical Goal(s): o Over the next 180 days, patient will work with PharmD and providers to achieve A1c goal <7% . Current regimen:  . Trulicity 3.53 mg inject once weekly . Metformin 1000 mg 1 tablet twice daily . Interventions: o  Discussed recommendations for moderate aerobic exercise for 150 minutes/week spread out over 5 days for heart healthy lifestyle o Discussed following the healthy plate method which includes: . Fill half of your plate with nonstarchy vegetables, such as spinach, broccoli, carrots and tomatoes. Todd [name] a quarter of your plate with a protein, such as tuna, lean pork or chicken. Todd [name] the last quarter with a whole-grain item, such  as brown rice, or a starchy vegetable, such as green peas or potatoes. . Include "good" fats such as nuts or avocados in small amounts. . Patient self care activities - Over the next 180 days, patient will: o Check blood sugar as directed, document, and provide at future appointments o Contact provider with any episodes of hypoglycemia  GERD . Pharmacist Clinical Goal(s) o Over the next 180 days, patient will work with PharmD and providers to manage symptoms of heartburn . Current regimen:  o Pantoprazole 20 mg 1 tablet daily  . Interventions: o Discussed non-pharmacologic management of symptoms such as elevating the head of your bed, avoiding eating 2-3 hours before bed, avoiding triggering foods such as acidic, spicy, or fatty foods, eating smaller meals, and wearing clothes that are loose around the waist  . Patient self care activities - Over the next 180 days, patient will: o Continue current medications  Medication management . Pharmacist Clinical Goal(s): o Over the next 180 days, patient will work with PharmD and providers to achieve optimal medication adherence . Current pharmacy: [facility] . Interventions o Comprehensive medication review performed. o Continue current medication management strategy . Patient self care activities - Over the next 180 days, patient will: o Take medications as prescribed o Report any questions or concerns to PharmD and/or provider(s)  Initial goal documentation        [name] was given information about Chronic Care Management services today including:  1. CCM service includes personalized support from designated clinical staff supervised by his physician, including individualized plan of care and coordination with other care providers 2. 24/7 contact phone numbers for assistance for urgent and routine care needs. 3. Standard insurance, coinsurance, copays and deductibles apply for chronic care management only during months in which we  provide at least 20 minutes of these services. Most insurances cover these services at 100%, however patients may be responsible for any copay, coinsurance and/or deductible if applicable. This service may help you avoid the need for more expensive face-to-face services. 4. Only one practitioner may furnish and bill the service in a calendar month. 5. The patient may stop CCM services at any time (effective at the end of the month) by phone call to the office staff.  Patient agreed to services and verbal consent obtained.   The patient verbalized understanding of instructions, educational materials, and care plan provided today and agreed to receive a mailed copy of patient instructions, educational materials, and care plan.  Telephone follow up appointment with pharmacy team member scheduled for: 6 months  Diabetes Mellitus and Nutrition, Adult When you have diabetes (diabetes mellitus), it is very important to have healthy eating habits because your blood sugar (glucose) levels are greatly affected by what you eat and drink. Eating healthy foods in the appropriate amounts, at about the same times every day, can help you:  Control your blood glucose.  Lower your risk of heart disease.  Improve your blood pressure.  Reach or maintain a healthy weight. Every person with diabetes is different, and each person has different needs for a  meal plan. Your health care provider may recommend that you work with a diet and nutrition specialist (dietitian) to make a meal plan that is best for you. Your meal plan may vary depending on factors such as:  The calories you need.  The medicines you take.  Your weight.  Your blood glucose, blood pressure, and cholesterol levels.  Your activity level.  Other health conditions you have, such as heart or kidney disease. How do carbohydrates affect me? Carbohydrates, also called carbs, affect your blood glucose level more than any other type of food.  Eating carbs naturally raises the amount of glucose in your blood. Carb counting is a method for keeping track of how many carbs you eat. Counting carbs is important to keep your blood glucose at a healthy level, especially if you use insulin or take certain oral diabetes medicines. It is important to know how many carbs you can safely have in each meal. This is different for every person. Your dietitian can help you calculate how many carbs you should have at each meal and for each snack. Foods that contain carbs include:  Bread, cereal, rice, pasta, and crackers.  Potatoes and corn.  Peas, beans, and lentils.  Milk and yogurt.  Fruit and juice.  Desserts, such as cakes, cookies, ice cream, and candy. How does alcohol affect me? Alcohol can cause a sudden decrease in blood glucose (hypoglycemia), especially if you use insulin or take certain oral diabetes medicines. Hypoglycemia can be a life-threatening condition. Symptoms of hypoglycemia (sleepiness, dizziness, and confusion) are similar to symptoms of having too much alcohol. If your health care provider says that alcohol is safe for you, follow these guidelines:  Limit alcohol intake to no more than 1 drink per day for nonpregnant women and 2 drinks per day for men. One drink equals 12 oz of beer, 5 oz of wine, or 1 oz of hard liquor.  Do not drink on an empty stomach.  Keep yourself hydrated with water, diet soda, or unsweetened iced tea.  Keep in mind that regular soda, juice, and other mixers may contain a lot of sugar and must be counted as carbs. What are tips for following this plan?  Reading food labels  Start by checking the serving size on the "Nutrition Facts" label of packaged foods and drinks. The amount of calories, carbs, fats, and other nutrients listed on the label is based on one serving of the item. Many items contain more than one serving per package.  Check the total grams (g) of carbs in one serving. You can  calculate the number of servings of carbs in one serving by dividing the total carbs by 15. For example, if a food has 30 g of total carbs, it would be equal to 2 servings of carbs.  Check the number of grams (g) of saturated and trans fats in one serving. Choose foods that have low or no amount of these fats.  Check the number of milligrams (mg) of salt (sodium) in one serving. Most people should limit total sodium intake to less than 2,300 mg per day.  Always check the nutrition information of foods labeled as "low-fat" or "nonfat". These foods may be higher in added sugar or refined carbs and should be avoided.  Talk to your dietitian to identify your daily goals for nutrients listed on the label. Shopping  Avoid buying canned, premade, or processed foods. These foods tend to be high in fat, sodium, and added sugar.  Shop  around the outside edge of the grocery store. This includes fresh fruits and vegetables, bulk grains, fresh meats, and fresh dairy. Cooking  Use low-heat cooking methods, such as baking, instead of high-heat cooking methods like deep frying.  Cook using healthy oils, such as olive, canola, or sunflower oil.  Avoid cooking with butter, cream, or high-fat meats. Meal planning  Eat meals and snacks regularly, preferably at the same times every day. Avoid going long periods of time without eating.  Eat foods high in fiber, such as fresh fruits, vegetables, beans, and whole grains. Talk to your dietitian about how many servings of carbs you can eat at each meal.  Eat 4-6 ounces (oz) of lean protein each day, such as lean meat, chicken, fish, eggs, or tofu. One oz of lean protein is equal to: ? 1 oz of meat, chicken, or fish. ? 1 egg. ?  cup of tofu.  Eat some foods each day that contain healthy fats, such as avocado, nuts, seeds, and fish. Lifestyle  Check your blood glucose regularly.  Exercise regularly as told by your health care provider. This may  include: ? 150 minutes of moderate-intensity or vigorous-intensity exercise each week. This could be brisk walking, biking, or water aerobics. ? Stretching and doing strength exercises, such as yoga or weightlifting, at least 2 times a week.  Take medicines as told by your health care provider.  Do not use any products that contain nicotine or tobacco, such as cigarettes and e-cigarettes. If you need help quitting, ask your health care provider.  Work with a Social worker or diabetes educator to identify strategies to manage stress and any emotional and social challenges. Questions to ask a health care provider  Do I need to meet with a diabetes educator?  Do I need to meet with a dietitian?  What number can I call if I have questions?  When are the best times to check my blood glucose? Where to find more information:  American Diabetes Association: diabetes.org  Academy of Nutrition and Dietetics: www.eatright.[employer] of Diabetes and Digestive and Kidney Diseases (NIH): [employer] Summary  A healthy meal plan will help you control your blood glucose and maintain a healthy lifestyle.  Working with a diet and nutrition specialist (dietitian) can help you make a meal plan that is best for you.  Keep in mind that carbohydrates (carbs) and alcohol have immediate effects on your blood glucose levels. It is important to count carbs and to use alcohol carefully. This information is not intended to replace advice given to you by your health care provider. Make sure you discuss any questions you have with your health care provider. Document Revised: 12/06/2016 Document Reviewed: 01/29/2016 Elsevier Patient Education  2020 [employer].

## 2019-12-05 DIAGNOSIS — H6123 Impacted cerumen, bilateral: Secondary | ICD-10-CM | POA: Diagnosis not present

## 2019-12-09 DIAGNOSIS — M5136 Other intervertebral disc degeneration, lumbar region: Secondary | ICD-10-CM | POA: Diagnosis not present

## 2019-12-09 DIAGNOSIS — M5134 Other intervertebral disc degeneration, thoracic region: Secondary | ICD-10-CM | POA: Diagnosis not present

## 2019-12-09 DIAGNOSIS — M9902 Segmental and somatic dysfunction of thoracic region: Secondary | ICD-10-CM | POA: Diagnosis not present

## 2019-12-09 DIAGNOSIS — M9905 Segmental and somatic dysfunction of pelvic region: Secondary | ICD-10-CM | POA: Diagnosis not present

## 2019-12-09 DIAGNOSIS — M9901 Segmental and somatic dysfunction of cervical region: Secondary | ICD-10-CM | POA: Diagnosis not present

## 2019-12-13 ENCOUNTER — Other Ambulatory Visit: Payer: Self-pay | Admitting: Family Medicine

## 2019-12-15 DIAGNOSIS — M9902 Segmental and somatic dysfunction of thoracic region: Secondary | ICD-10-CM | POA: Diagnosis not present

## 2019-12-15 DIAGNOSIS — M9901 Segmental and somatic dysfunction of cervical region: Secondary | ICD-10-CM | POA: Diagnosis not present

## 2019-12-15 DIAGNOSIS — M5134 Other intervertebral disc degeneration, thoracic region: Secondary | ICD-10-CM | POA: Diagnosis not present

## 2019-12-15 DIAGNOSIS — M9905 Segmental and somatic dysfunction of pelvic region: Secondary | ICD-10-CM | POA: Diagnosis not present

## 2019-12-15 DIAGNOSIS — M5136 Other intervertebral disc degeneration, lumbar region: Secondary | ICD-10-CM | POA: Diagnosis not present

## 2019-12-20 ENCOUNTER — Encounter: Payer: Self-pay | Admitting: Family Medicine

## 2019-12-22 DIAGNOSIS — M9905 Segmental and somatic dysfunction of pelvic region: Secondary | ICD-10-CM | POA: Diagnosis not present

## 2019-12-22 DIAGNOSIS — M9901 Segmental and somatic dysfunction of cervical region: Secondary | ICD-10-CM | POA: Diagnosis not present

## 2019-12-22 DIAGNOSIS — M5134 Other intervertebral disc degeneration, thoracic region: Secondary | ICD-10-CM | POA: Diagnosis not present

## 2019-12-22 DIAGNOSIS — M9902 Segmental and somatic dysfunction of thoracic region: Secondary | ICD-10-CM | POA: Diagnosis not present

## 2019-12-22 DIAGNOSIS — M5136 Other intervertebral disc degeneration, lumbar region: Secondary | ICD-10-CM | POA: Diagnosis not present

## 2019-12-23 ENCOUNTER — Other Ambulatory Visit: Payer: Self-pay | Admitting: Cardiovascular Disease

## 2019-12-29 DIAGNOSIS — M9902 Segmental and somatic dysfunction of thoracic region: Secondary | ICD-10-CM | POA: Diagnosis not present

## 2019-12-29 DIAGNOSIS — M9905 Segmental and somatic dysfunction of pelvic region: Secondary | ICD-10-CM | POA: Diagnosis not present

## 2019-12-29 DIAGNOSIS — M5134 Other intervertebral disc degeneration, thoracic region: Secondary | ICD-10-CM | POA: Diagnosis not present

## 2019-12-29 DIAGNOSIS — M5136 Other intervertebral disc degeneration, lumbar region: Secondary | ICD-10-CM | POA: Diagnosis not present

## 2019-12-29 DIAGNOSIS — M9901 Segmental and somatic dysfunction of cervical region: Secondary | ICD-10-CM | POA: Diagnosis not present

## 2020-01-05 DIAGNOSIS — M5136 Other intervertebral disc degeneration, lumbar region: Secondary | ICD-10-CM | POA: Diagnosis not present

## 2020-01-05 DIAGNOSIS — M9902 Segmental and somatic dysfunction of thoracic region: Secondary | ICD-10-CM | POA: Diagnosis not present

## 2020-01-05 DIAGNOSIS — M5134 Other intervertebral disc degeneration, thoracic region: Secondary | ICD-10-CM | POA: Diagnosis not present

## 2020-01-05 DIAGNOSIS — M9905 Segmental and somatic dysfunction of pelvic region: Secondary | ICD-10-CM | POA: Diagnosis not present

## 2020-01-05 DIAGNOSIS — M9901 Segmental and somatic dysfunction of cervical region: Secondary | ICD-10-CM | POA: Diagnosis not present

## 2020-01-12 ENCOUNTER — Ambulatory Visit: Payer: Medicare Other | Admitting: Family Medicine

## 2020-01-12 DIAGNOSIS — M5136 Other intervertebral disc degeneration, lumbar region: Secondary | ICD-10-CM | POA: Diagnosis not present

## 2020-01-12 DIAGNOSIS — M9902 Segmental and somatic dysfunction of thoracic region: Secondary | ICD-10-CM | POA: Diagnosis not present

## 2020-01-12 DIAGNOSIS — M5030 Other cervical disc degeneration, unspecified cervical region: Secondary | ICD-10-CM | POA: Diagnosis not present

## 2020-01-12 DIAGNOSIS — M9905 Segmental and somatic dysfunction of pelvic region: Secondary | ICD-10-CM | POA: Diagnosis not present

## 2020-01-12 DIAGNOSIS — M5134 Other intervertebral disc degeneration, thoracic region: Secondary | ICD-10-CM | POA: Diagnosis not present

## 2020-01-12 DIAGNOSIS — M9901 Segmental and somatic dysfunction of cervical region: Secondary | ICD-10-CM | POA: Diagnosis not present

## 2020-01-14 ENCOUNTER — Ambulatory Visit: Payer: Medicare Other | Admitting: Cardiovascular Disease

## 2020-01-14 ENCOUNTER — Ambulatory Visit: Payer: Medicare Other | Admitting: Family Medicine

## 2020-01-14 DIAGNOSIS — M9902 Segmental and somatic dysfunction of thoracic region: Secondary | ICD-10-CM | POA: Diagnosis not present

## 2020-01-14 DIAGNOSIS — M5134 Other intervertebral disc degeneration, thoracic region: Secondary | ICD-10-CM | POA: Diagnosis not present

## 2020-01-14 DIAGNOSIS — M5136 Other intervertebral disc degeneration, lumbar region: Secondary | ICD-10-CM | POA: Diagnosis not present

## 2020-01-14 DIAGNOSIS — M9901 Segmental and somatic dysfunction of cervical region: Secondary | ICD-10-CM | POA: Diagnosis not present

## 2020-01-14 DIAGNOSIS — M9905 Segmental and somatic dysfunction of pelvic region: Secondary | ICD-10-CM | POA: Diagnosis not present

## 2020-01-20 DIAGNOSIS — M9902 Segmental and somatic dysfunction of thoracic region: Secondary | ICD-10-CM | POA: Diagnosis not present

## 2020-01-20 DIAGNOSIS — M9905 Segmental and somatic dysfunction of pelvic region: Secondary | ICD-10-CM | POA: Diagnosis not present

## 2020-01-20 DIAGNOSIS — M5136 Other intervertebral disc degeneration, lumbar region: Secondary | ICD-10-CM | POA: Diagnosis not present

## 2020-01-20 DIAGNOSIS — M9901 Segmental and somatic dysfunction of cervical region: Secondary | ICD-10-CM | POA: Diagnosis not present

## 2020-01-20 DIAGNOSIS — M5134 Other intervertebral disc degeneration, thoracic region: Secondary | ICD-10-CM | POA: Diagnosis not present

## 2020-01-22 ENCOUNTER — Other Ambulatory Visit: Payer: Self-pay | Admitting: Cardiovascular Disease

## 2020-01-26 DIAGNOSIS — M9901 Segmental and somatic dysfunction of cervical region: Secondary | ICD-10-CM | POA: Diagnosis not present

## 2020-01-26 DIAGNOSIS — M9905 Segmental and somatic dysfunction of pelvic region: Secondary | ICD-10-CM | POA: Diagnosis not present

## 2020-01-26 DIAGNOSIS — M5030 Other cervical disc degeneration, unspecified cervical region: Secondary | ICD-10-CM | POA: Diagnosis not present

## 2020-01-28 ENCOUNTER — Ambulatory Visit: Payer: Medicare Other | Admitting: Family Medicine

## 2020-01-31 ENCOUNTER — Encounter: Payer: Self-pay | Admitting: Family Medicine

## 2020-02-02 DIAGNOSIS — M5136 Other intervertebral disc degeneration, lumbar region: Secondary | ICD-10-CM | POA: Diagnosis not present

## 2020-02-02 DIAGNOSIS — M5030 Other cervical disc degeneration, unspecified cervical region: Secondary | ICD-10-CM | POA: Diagnosis not present

## 2020-02-02 DIAGNOSIS — M9901 Segmental and somatic dysfunction of cervical region: Secondary | ICD-10-CM | POA: Diagnosis not present

## 2020-02-02 DIAGNOSIS — M9905 Segmental and somatic dysfunction of pelvic region: Secondary | ICD-10-CM | POA: Diagnosis not present

## 2020-02-02 DIAGNOSIS — M5134 Other intervertebral disc degeneration, thoracic region: Secondary | ICD-10-CM | POA: Diagnosis not present

## 2020-02-02 DIAGNOSIS — M9902 Segmental and somatic dysfunction of thoracic region: Secondary | ICD-10-CM | POA: Diagnosis not present

## 2020-02-07 ENCOUNTER — Other Ambulatory Visit: Payer: Self-pay

## 2020-02-07 ENCOUNTER — Ambulatory Visit (INDEPENDENT_AMBULATORY_CARE_PROVIDER_SITE_OTHER): Payer: Medicare Other | Admitting: Cardiovascular Disease

## 2020-02-07 ENCOUNTER — Encounter: Payer: Self-pay | Admitting: Cardiovascular Disease

## 2020-02-07 ENCOUNTER — Ambulatory Visit (INDEPENDENT_AMBULATORY_CARE_PROVIDER_SITE_OTHER): Payer: Medicare Other | Admitting: Family Medicine

## 2020-02-07 ENCOUNTER — Encounter: Payer: Self-pay | Admitting: Family Medicine

## 2020-02-07 VITALS — BP 130/78 | HR 83 | Ht 74.0 in | Wt 220.0 lb

## 2020-02-07 VITALS — BP 138/82 | HR 68 | Ht 74.0 in | Wt 219.0 lb

## 2020-02-07 DIAGNOSIS — R140 Abdominal distension (gaseous): Secondary | ICD-10-CM | POA: Insufficient documentation

## 2020-02-07 DIAGNOSIS — R109 Unspecified abdominal pain: Secondary | ICD-10-CM | POA: Diagnosis not present

## 2020-02-07 DIAGNOSIS — E119 Type 2 diabetes mellitus without complications: Secondary | ICD-10-CM

## 2020-02-07 DIAGNOSIS — I493 Ventricular premature depolarization: Secondary | ICD-10-CM

## 2020-02-07 DIAGNOSIS — E782 Mixed hyperlipidemia: Secondary | ICD-10-CM | POA: Diagnosis not present

## 2020-02-07 DIAGNOSIS — I10 Essential (primary) hypertension: Secondary | ICD-10-CM | POA: Diagnosis not present

## 2020-02-07 DIAGNOSIS — Z1211 Encounter for screening for malignant neoplasm of colon: Secondary | ICD-10-CM | POA: Insufficient documentation

## 2020-02-07 DIAGNOSIS — H35373 Puckering of macula, bilateral: Secondary | ICD-10-CM | POA: Diagnosis not present

## 2020-02-07 DIAGNOSIS — K5900 Constipation, unspecified: Secondary | ICD-10-CM | POA: Insufficient documentation

## 2020-02-07 DIAGNOSIS — Z860100 Personal history of colon polyps, unspecified: Secondary | ICD-10-CM | POA: Insufficient documentation

## 2020-02-07 DIAGNOSIS — H2513 Age-related nuclear cataract, bilateral: Secondary | ICD-10-CM | POA: Diagnosis not present

## 2020-02-07 DIAGNOSIS — K5730 Diverticulosis of large intestine without perforation or abscess without bleeding: Secondary | ICD-10-CM | POA: Insufficient documentation

## 2020-02-07 DIAGNOSIS — R1033 Periumbilical pain: Secondary | ICD-10-CM | POA: Insufficient documentation

## 2020-02-07 LAB — CBC WITH DIFFERENTIAL/PLATELET
Basophils Absolute: 0 10*3/uL (ref 0.0–0.1)
Basophils Relative: 0.5 % (ref 0.0–3.0)
Eosinophils Absolute: 0.1 10*3/uL (ref 0.0–0.7)
Eosinophils Relative: 0.8 % (ref 0.0–5.0)
HCT: 44 % (ref 39.0–52.0)
Hemoglobin: 14.5 g/dL (ref 13.0–17.0)
Lymphocytes Relative: 33 % (ref 12.0–46.0)
Lymphs Abs: 2.2 10*3/uL (ref 0.7–4.0)
MCHC: 33 g/dL (ref 30.0–36.0)
MCV: 84 fl (ref 78.0–100.0)
Monocytes Absolute: 0.4 10*3/uL (ref 0.1–1.0)
Monocytes Relative: 6.8 % (ref 3.0–12.0)
Neutro Abs: 3.9 10*3/uL (ref 1.4–7.7)
Neutrophils Relative %: 58.9 % (ref 43.0–77.0)
Platelets: 271 10*3/uL (ref 150.0–400.0)
RBC: 5.24 Mil/uL (ref 4.22–5.81)
RDW: 14.2 % (ref 11.5–15.5)
WBC: 6.6 10*3/uL (ref 4.0–10.5)

## 2020-02-07 LAB — COMPREHENSIVE METABOLIC PANEL
ALT: 23 U/L (ref 0–53)
AST: 13 U/L (ref 0–37)
Albumin: 4.7 g/dL (ref 3.5–5.2)
Alkaline Phosphatase: 53 U/L (ref 39–117)
BUN: 19 mg/dL (ref 6–23)
CO2: 28 mEq/L (ref 19–32)
Calcium: 10.3 mg/dL (ref 8.4–10.5)
Chloride: 102 mEq/L (ref 96–112)
Creatinine, Ser: 0.94 mg/dL (ref 0.40–1.50)
GFR: 80.48 mL/min (ref >60.00)
Glucose, Bld: 118 mg/dL — ABNORMAL HIGH (ref 70–99)
Potassium: 4.7 mEq/L (ref 3.5–5.1)
Sodium: 139 mEq/L (ref 135–145)
Total Bilirubin: 0.4 mg/dL (ref 0.2–1.2)
Total Protein: 7.7 g/dL (ref 6.0–8.3)

## 2020-02-07 LAB — HM DIABETES EYE EXAM

## 2020-02-07 LAB — LIPID PANEL
Cholesterol: 141 mg/dL (ref 0–200)
HDL: 37.3 mg/dL — ABNORMAL LOW (ref >39.00)
LDL Cholesterol: 63 mg/dL (ref 0–99)
NonHDL: 103.47
Total CHOL/HDL Ratio: 4
Triglycerides: 200 mg/dL — ABNORMAL HIGH (ref 0.0–149.0)
VLDL: 40 mg/dL (ref 0.0–40.0)

## 2020-02-07 LAB — SEDIMENTATION RATE: Sed Rate: 22 mm/hr — ABNORMAL HIGH (ref 0–20)

## 2020-02-07 LAB — POCT GLYCOSYLATED HEMOGLOBIN (HGB A1C): Hemoglobin A1C: 7.6 % — AB (ref 4.0–5.6)

## 2020-02-07 LAB — LIPASE: Lipase: 20 U/L (ref 11.0–59.0)

## 2020-02-07 MED ORDER — ROSUVASTATIN CALCIUM 10 MG PO TABS: ORAL_TABLET | ORAL | 3 refills | Status: DC

## 2020-02-07 NOTE — Progress Notes (Signed)
Established Patient Office Visit  Subjective:  Patient ID: Todd [name], male    DOB: 09/27/1945  Age: 75 y.o. MRN: [id number]  CC:  Chief Complaint  Patient presents with  . Diabetes    HPI [facility] [name] presents for intermittent abdominal pain.  He sent a note on the 24th stating that he had some abdominal discomfort and black stool (once) and chronic constipation unchanged with some intermittent lack of appetite.  He had specific concerns about Crohn's disease and "colitis ".  He actually had taken some Pepto-Bismol recently which probably explains the black stool.  He is not describing any melena.  He has had about 3 weeks of intermittent pain which is actually below the epigastric region in the midline.  He states this is a dull pain and sometimes occurs at night and may be somewhat positional.  No relation to eating.  Denies any fever, chills, chronic diarrhea, nausea, vomiting.  He had CT scan 2017 which showed no abdominal aortic aneurysm.  No radiation of pain.  History of poorly controlled diabetes.  A1c back in October 8.4%.  He is on Trulicity [id number] mg subcutaneous weekly along with Metformin.  He is reluctant to consider other medications.  He is compliant with those medications.  Not monitoring blood sugars regularly.  No polyuria or polydipsia  Past Medical History:  Diagnosis Date  . DIABETES MELLITUS, TYPE II 08/08/2009  . Hiatal hernia   . History of nuclear stress test    Myoview 02/2019: EF 54, apical inf and apical artifact, normal perfusion, low risk  . HYPERLIPIDEMIA 08/08/2009  . Palpitations   . URINARY INCONTINENCE 08/08/2009    Past Surgical History:  Procedure Laterality Date  . ACHILLES TENDON REPAIR  2002   rupture  . APPENDECTOMY  1976  . CARDIOVASCULAR STRESS TEST  11-02-2003   EF 57%  . TONSILLECTOMY  1961  . US ECHOCARDIOGRAPHY  02-10-2001   EF 60-65%    Family History  Problem Relation Age of Onset  . Mental illness Mother   . Diabetes Sister         type ll    Social History   Socioeconomic History  . Marital status: Married    Spouse name: Not on file  . Number of children: Not on file  . Years of education: Not on file  . Highest education level: Not on file  Occupational History  . Not on file  Tobacco Use  . Smoking status: Former Smoker    Packs/day: 2.00    Years: 10.00    Pack years: 20.00    Types: Cigarettes    Quit date: 04/19/1977    Years since quitting: 42.8  . Smokeless tobacco: Never Used  . Tobacco comment: discussed AAA but think he has had this  Vaping Use  . Vaping Use: Never used  Substance and Sexual Activity  . Alcohol use: Yes    Comment:  glass of wine x 2 per week  . Drug use: No  . Sexual activity: Not on file  Other Topics Concern  . Not on file  Social History Narrative  . Not on file   Social Determinants of Health   Financial Resource Strain: Low Risk   . Difficulty of Paying Living Expenses: Not very hard  Food Insecurity: Not on file  Transportation Needs: No Transportation Needs  . Lack of Transportation (Medical): No  . Lack of Transportation (Non-Medical): No  Physical Activity: Not on file  Stress:  Not on file  Social Connections: Not on file  Intimate Partner Violence: Not on file    Outpatient Medications Prior to Visit  Medication Sig Dispense Refill  . Coenzyme Q10 (CO Q 10 PO) Take 200 mg by mouth daily.    . fenofibrate 160 MG tablet TAKE 1 TABLET BY MOUTH  DAILY 90 tablet 3  . metFORMIN (GLUCOPHAGE) 1000 MG tablet TAKE 1 TABLET BY MOUTH  TWICE DAILY WITH MEALS 180 tablet 3  . omeprazole (PRILOSEC) 40 MG capsule 1 cap(s)    . pantoprazole (PROTONIX) 20 MG tablet Take 1 tablet (20 mg total) by mouth daily. 30 tablet 0  . polycarbophil (FIBERCON) 625 MG tablet Take 625 mg by mouth daily. Taking 2 Tablet    . rosuvastatin (CRESTOR) 10 MG tablet TAKE 1 TABLET BY MOUTH  DAILY PT NEEDS TO KEEP  UPCOMING APPOINTMENT IN  JAN, 2022 FOR FURTHER  REFILLS 90 tablet 0  .  TRULICITY 0.24 OX/7.3ZH SOPN INJECT 0.75 MG INTO THE  SKIN WEEKLY 6 mL 3  . vitamin B-12 (CYANOCOBALAMIN) 1000 MCG tablet Take 1,000 mcg by mouth daily.    Marland Kitchen VITAMIN D, ERGOCALCIFEROL, PO Take 2,000 Units by mouth daily.      No facility-administered medications prior to visit.    Allergies  Allergen Reactions  . Jardiance [Empagliflozin]     Yeast infections  . Sudafed Pe Sinus Cong Day-Nght  [Diphenhydramine-Phenylephrine] Other (See Comments)  . Actifed Cold-Sinus     rash  . Lisinopril Swelling  . Pseudoephedrine     REACTION: hives    ROS Review of Systems  Constitutional: Negative for chills and fever.  HENT: Negative for trouble swallowing.   Respiratory: Negative for cough and shortness of breath.   Cardiovascular: Negative for chest pain.  Gastrointestinal: Positive for abdominal pain and constipation. Negative for blood in stool, diarrhea, nausea and vomiting.  Genitourinary: Negative for dysuria.  Neurological: Negative for dizziness.      Objective:    Physical Exam Vitals reviewed.  Constitutional:      Appearance: Normal appearance.  Cardiovascular:     Rate and Rhythm: Normal rate.  Pulmonary:     Effort: Pulmonary effort is normal.     Breath sounds: Normal breath sounds.  Abdominal:     General: Bowel sounds are normal. There is no distension.     Palpations: Abdomen is soft. There is no mass.     Tenderness: There is no abdominal tenderness. There is no guarding or rebound.     Hernia: No hernia is present.  Neurological:     Mental Status: He is alert.     BP 138/82   Pulse 68   Ht 6\' 2"  (1.88 m)   Wt 219 lb (99.3 kg)   SpO2 97%   BMI 28.12 kg/m  Wt Readings from Last 3 Encounters:  02/07/20 219 lb (99.3 kg)  10/11/19 224 lb (101.6 kg)  07/15/19 223 lb (101.2 kg)     Health Maintenance Due  Topic Date Due  . TETANUS/TDAP  11/07/2017  . URINE MICROALBUMIN  04/30/2018  . COVID-19 Vaccine (3 - Booster for Pfizer series) 09/03/2019   . FOOT EXAM  12/28/2019  . OPHTHALMOLOGY EXAM  01/27/2020    There are no preventive care reminders to display for this patient.  Lab Results  Component Value Date   TSH 0.80 09/08/2013   Lab Results  Component Value Date   WBC 5.8 02/26/2013   HGB 14.2 02/26/2013  HCT 44.0 02/26/2013   MCV 88.6 02/26/2013   PLT 252.0 02/26/2013   Lab Results  Component Value Date   NA 138 06/11/2018   K 4.5 06/11/2018   CO2 28 06/11/2018   GLUCOSE 176 (H) 06/11/2018   BUN 16 06/11/2018   CREATININE 1.01 06/11/2018   BILITOT 0.3 06/11/2018   ALKPHOS 53 06/11/2018   AST 15 06/11/2018   ALT 31 06/11/2018   PROT 7.5 06/11/2018   ALBUMIN 4.5 06/11/2018   CALCIUM 10.0 06/11/2018   GFR 87.93 06/11/2018   Lab Results  Component Value Date   CHOL 148 06/11/2018   Lab Results  Component Value Date   HDL 40.80 06/11/2018   Lab Results  Component Value Date   LDLCALC 72 01/14/2018   Lab Results  Component Value Date   TRIG 218.0 (H) 06/11/2018   Lab Results  Component Value Date   CHOLHDL 4 06/11/2018   Lab Results  Component Value Date   HGBA1C 7.6 (A) 02/07/2020      Assessment & Plan:   #1 midline abdominal pain just below the epigastric region.  Symptoms are intermittent past 3 weeks.  Denies any red flags such as recurrent vomiting, significant weight loss, fever.  Explained clinical likelihood of etiology such as Crohn's disease or ulcerative colitis would be very unlikely to be diagnosed at 19.  Doubt gastric or duodenal ulcer given location.  Doubt acute diverticulitis  -Check lab work including CBC, comprehensive metabolic panel, lipase -Reviewed red flags and things to watch for such as fever, recurrent vomiting, progressive pain, etc. -Consider imaging with ultrasound or possibly repeat CT abdomen pelvis if symptoms persist or if symptoms worsen  #2 type 2 diabetes improved with reduction A1c 8.4% in October to 7.6% with dietary and lifestyle management.  He is  currently on Trulicity and Metformin and prefers to avoid further medications at this time if possible -We will plan 64-month follow-up and reassess then  No orders of the defined types were placed in this encounter.   Follow-up: No follow-ups on file.    Carolann Littler, MD

## 2020-02-07 NOTE — Patient Instructions (Signed)
Abdominal Pain, Adult Pain in the abdomen (abdominal pain) can be caused by many things. Often, abdominal pain is not serious and it gets better with no treatment or by being treated at home. However, sometimes abdominal pain is serious. Your health care provider will ask questions about your medical history and do a physical exam to try to determine the cause of your abdominal pain. Follow these instructions at home: Medicines  Take over-the-counter and prescription medicines only as told by your health care provider.  Do not take a laxative unless told by your health care provider. General instructions  Watch your condition for any changes.  Drink enough fluid to keep your urine pale yellow.  Keep all follow-up visits as told by your health care provider. This is important.   Contact a health care provider if:  Your abdominal pain changes or gets worse.  You are not hungry or you lose weight without trying.  You are constipated or have diarrhea for more than 2-3 days.  You have pain when you urinate or have a bowel movement.  Your abdominal pain wakes you up at night.  Your pain gets worse with meals, after eating, or with certain foods.  You are vomiting and cannot keep anything down.  You have a fever.  You have blood in your urine. Get help right away if:  Your pain does not go away as soon as your health care provider told you to expect.  You cannot stop vomiting.  Your pain is only in areas of the abdomen, such as the right side or the left lower portion of the abdomen. Pain on the right side could be caused by appendicitis.  You have bloody or black stools, or stools that look like tar.  You have severe pain, cramping, or bloating in your abdomen.  You have signs of dehydration, such as: ? Dark urine, very little urine, or no urine. ? Cracked lips. ? Dry mouth. ? Sunken eyes. ? Sleepiness. ? Weakness.  You have trouble breathing or chest  pain. Summary  Often, abdominal pain is not serious and it gets better with no treatment or by being treated at home. However, sometimes abdominal pain is serious.  Watch your condition for any changes.  Take over-the-counter and prescription medicines only as told by your health care provider.  Contact a health care provider if your abdominal pain changes or gets worse.  Get help right away if you have severe pain, cramping, or bloating in your abdomen. This information is not intended to replace advice given to you by your health care provider. Make sure you discuss any questions you have with your health care provider. Document Revised: 02/12/2019 Document Reviewed: 05/04/2018 Elsevier Patient Education  [employer].

## 2020-02-07 NOTE — Progress Notes (Signed)
Cardiology Office Note   Date:  02/07/2020   ID:  Todd [name], DOB December 15, 1945, MRN [id number]  PCP:  [name], MD  Cardiologist:   Mertie Moores, MD   Chief Complaint  Patient presents with  . Hypertension  . Hyperlipidemia   1. Hypercholesterolemia 2. Palpitations 3. History of chest pain- normal stress Myoview study in October, 2005 4. Diabetes mellitus 5. Hypertension 6. Atypical chest pain:    Todd [name] is a 75 year old gentleman with a history as noted above. He is remaining quite active. He is fairly healthy. He's been exercising on a fairly regular basis.  He denies episodes of chest pain or shortness of breath.  Feb. 6, 2014: Todd [name] is doing well. He's not had any episodes of chest pain or shortness breath. He still exercising some but not as much as he would like to. He gained a little bit of weight on a cruise this past fall and still working on getting that weight off.  April, 18, 2014:  He feels ok but has had an unusual sensation across his chest. It is a similar sensation that he gets in his arms with the pravachol. He has decreased his dose and feels better. The sensation is not related to exercise, taking a deep breath, eating or drinking. It is also not related to twisting or turning of his torso.. the sensation is an off and on sensation. to last for a couple of hours.  Nov. 3, 2014:  Todd [name] is doing well. Exercising sporadically .   Sept. 3, 2015:  Todd [name] is doing ok. Not exercising as much. 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 Has gained some weight - lack of exercise, Has not changed his diet.     May 04, 2014:   Todd [name] is a 75 y.o. male who presents for follow up of his HTN and hyperlipidemia.   He's been having some episodes of CP recently.  Worse with deep breath. More fatigued for the past couple of weeks  .  Sleeping well.  Wants to get back into an exercise regimin.    Nov. 8, 2016:  Doing well.  Fairly active.  Walks on occasion.    Dec. 5, 2017:  Todd [name] is doing well. Had a CT of the abdomen that mentioned possible coronary artery calcifications.  Had a low risk myoview in July, 2017. Lipids in Sept. 2016.  are ok.   Trigs = 217, LDL was 94 Has a physical coming up in a week or so   No CP.   Still very active .   Limited by his back pain .   Aug. 20, 2018:  Todd [name] is seen back today  For further evaluation of his hyperlipidemia and coronary calcifications. He had a Myoview study in July, 2017 which was low risk.  Has gotten married since I last saw him .  No CP or dyspnea. Not as much exercise as he would like Has some back issues.   November 27, 2016: Todd [name] is seen today with his new wife, [name].  Has had some heart burn recently .   Occurs spontaneously.   Not associated with exertion  No Cp or dyspnea with any exertion   Resolved with Prilosec.    Not getting enough exercise,   Still traveling   July 03, 2017 :  Todd [name] is seen today  No CP , no dyspnea.    Still working .  Will be moving to [place]  in several weeks.  January 8, [id number]: Todd [name] seen today for follow-up visit.  He has a history of hypertension and hyperlipidemia. Has moved to [facility] since I last saw him. No CP or dyspnea.  Has not been exercising as much .   Jan. 31, 2022 Todd [name] is seen today for follow up visit for his HTN and HLD. Has moved to [place], [place]  Has DM.  Is not exercising much .  No CP , dyspnea.   Had lipids drawn this am at Dr. Erick Blinks office   Past Medical History:  Diagnosis Date  . DIABETES MELLITUS, TYPE II 08/08/2009  . Hiatal hernia   . History of nuclear stress test    Myoview 02/2019: EF 54, apical inf and apical artifact, normal perfusion, low risk  . HYPERLIPIDEMIA 08/08/2009  . Palpitations   . URINARY INCONTINENCE 08/08/2009    Past Surgical History:  Procedure Laterality Date  . ACHILLES TENDON REPAIR  2002    rupture  . APPENDECTOMY  1976  . CARDIOVASCULAR STRESS TEST  11-02-2003   EF 57%  . TONSILLECTOMY  1961  . US ECHOCARDIOGRAPHY  02-10-2001   EF 60-65%     Current Outpatient Medications  Medication Sig Dispense Refill  . Coenzyme Q10 (CO Q 10 PO) Take 200 mg by mouth daily.    . fenofibrate 160 MG tablet TAKE 1 TABLET BY MOUTH  DAILY 90 tablet 3  . metFORMIN (GLUCOPHAGE) 1000 MG tablet TAKE 1 TABLET BY MOUTH  TWICE DAILY WITH MEALS 180 tablet 3  . omeprazole (PRILOSEC) 40 MG capsule 1 cap(s)    . pantoprazole (PROTONIX) 20 MG tablet Take 1 tablet (20 mg total) by mouth daily. 30 tablet 0  . polycarbophil (FIBERCON) 625 MG tablet Take 625 mg by mouth daily. Taking 2 Tablet    . TRULICITY [id number] 0000000 SOPN INJECT 0.75 MG INTO THE  SKIN WEEKLY 6 mL 3  . vitamin B-12 (CYANOCOBALAMIN) 1000 MCG tablet Take 1,000 mcg by mouth daily.    Marland Kitchen VITAMIN D, ERGOCALCIFEROL, PO Take 2,000 Units by mouth daily.     . rosuvastatin (CRESTOR) 10 MG tablet TAKE 1 TABLET BY MOUTH  DAILY 90 tablet 3   No current facility-administered medications for this visit.    Allergies:   Jardiance [empagliflozin], Sudafed pe sinus cong day-nght  [diphenhydramine-phenylephrine], Actifed cold-sinus, Lisinopril, and Pseudoephedrine    Social History:  The patient  reports that he quit smoking about 42 years ago. His smoking use included cigarettes. He has a 20.00 pack-year smoking history. He has never used smokeless tobacco. He reports current alcohol use. He reports that he does not use drugs.   Family History:  The patient's family history includes Diabetes in his sister; Mental illness in his mother.    ROS: Noted in current history, otherwise review of systems is negative.   Physical Exam: Blood pressure 130/78, pulse 83, height 6\' 2"  (1.88 m), weight 220 lb (99.8 kg), SpO2 96 %.  GEN:  Well nourished, well developed in no acute distress HEENT: Normal NECK: No JVD; No carotid bruits LYMPHATICS: No  lymphadenopathy CARDIAC: RRR , no murmurs, rubs, gallops RESPIRATORY:  Clear to auscultation without rales, wheezing or rhonchi  ABDOMEN: Soft, non-tender, non-distended MUSCULOSKELETAL:  No edema; No deformity  SKIN: Warm and dry NEUROLOGIC:  Alert and oriented x 3   EKG:     Jan. 31, 2022:   NSR at 83. Rare PVCs  Recent Labs: 02/07/2020: ALT  23; BUN 19; Creatinine, Ser 0.94; Hemoglobin 14.5; Platelets 271.0; Potassium 4.7; Sodium 139    Lipid Panel    Component Value Date/Time   CHOL 141 02/07/2020 1135   CHOL 143 01/14/2018 1146   TRIG 200.0 (H) 02/07/2020 1135   HDL 37.30 (L) 02/07/2020 1135   HDL 37 (L) 01/14/2018 1146   CHOLHDL 4 02/07/2020 1135   VLDL 40.0 02/07/2020 1135   LDLCALC 63 02/07/2020 1135   LDLCALC 72 01/14/2018 1146   LDLDIRECT 84.0 06/11/2018 1052      Wt Readings from Last 3 Encounters:  02/07/20 220 lb (99.8 kg)  02/07/20 219 lb (99.3 kg)  10/11/19 224 lb (101.6 kg)      Other studies Reviewed: Additional studies/ records that were reviewed today include: . Review of the above records demonstrates:    ASSESSMENT AND PLAN:  1. Hypercholesterolemia-    Had lipids drawn this am at Dr. Erick Blinks office    2. PVCs  -   Has rare PVCs , benign,  Asymptomatic   3. History of chest pain-     Re recent chest pain    4. Diabetes mellitus - managed by primary MD    5. Hypertension- BP is well controlled.       Current medicines are reviewed at length with the patient today.  The patient does not have concerns regarding medicines.  The following changes have been made:  no change  Labs/ tests ordered today include:   Orders Placed This Encounter  Procedures  . EKG 12-Lead     Disposition:   FU with me in 1 year    Signed, Mertie Moores, MD  02/07/2020 3:27 PM    [facility] [address], [place], [place]  [address] Phone: [phone]; Fax: [phone]

## 2020-02-07 NOTE — Patient Instructions (Signed)
Medication Instructions:  Your physician recommends that you continue on your current medications as directed. Please refer to the Current Medication list given to you today.  *If you need a refill on your cardiac medications before your next appointment, please call your pharmacy*   Lab Work: None today If you have labs (blood work) drawn today and your tests are completely normal, you will receive your results only by: . MyChart Message (if you have MyChart) OR . A paper copy in the mail If you have any lab test that is abnormal or we need to change your treatment, we will call you to review the results.   Testing/Procedures: None ordered   Follow-Up: At [facility], you and your health needs are our priority.  As part of our continuing mission to provide you with exceptional heart care, we have created designated Provider Care Teams.  These Care Teams include your primary Cardiologist (physician) and Advanced Practice Providers (APPs -  Physician Assistants and Nurse Practitioners) who all work together to provide you with the care you need, when you need it.    Your next appointment:   1 year(s)  The format for your next appointment:   In Person  Provider:   You may see Philip [name], MD or one of the following Advanced Practice Providers on your designated Care Team:    Scott Weaver, PA-C  Vin Bhagat, PA-C    

## 2020-02-08 ENCOUNTER — Encounter: Payer: Self-pay | Admitting: Family Medicine

## 2020-02-08 DIAGNOSIS — Z1211 Encounter for screening for malignant neoplasm of colon: Secondary | ICD-10-CM

## 2020-02-09 DIAGNOSIS — M5030 Other cervical disc degeneration, unspecified cervical region: Secondary | ICD-10-CM | POA: Diagnosis not present

## 2020-02-09 DIAGNOSIS — M5134 Other intervertebral disc degeneration, thoracic region: Secondary | ICD-10-CM | POA: Diagnosis not present

## 2020-02-09 DIAGNOSIS — M9901 Segmental and somatic dysfunction of cervical region: Secondary | ICD-10-CM | POA: Diagnosis not present

## 2020-02-09 DIAGNOSIS — M9902 Segmental and somatic dysfunction of thoracic region: Secondary | ICD-10-CM | POA: Diagnosis not present

## 2020-02-09 DIAGNOSIS — M5136 Other intervertebral disc degeneration, lumbar region: Secondary | ICD-10-CM | POA: Diagnosis not present

## 2020-02-09 DIAGNOSIS — M9905 Segmental and somatic dysfunction of pelvic region: Secondary | ICD-10-CM | POA: Diagnosis not present

## 2020-02-09 NOTE — Progress Notes (Signed)
Mychart message sent: Lab all stable.  Lipase normal.  No WBC elevation.  Electrolytes and kidney function stable.  Sed rate 22- which for age is really not clinically significant.    Labs reassuring but be in touch for any persistent or worsening symptoms.  Would go ahead with getting colonoscopy set up in [place].

## 2020-02-16 DIAGNOSIS — M9905 Segmental and somatic dysfunction of pelvic region: Secondary | ICD-10-CM | POA: Diagnosis not present

## 2020-02-16 DIAGNOSIS — M5134 Other intervertebral disc degeneration, thoracic region: Secondary | ICD-10-CM | POA: Diagnosis not present

## 2020-02-16 DIAGNOSIS — M5030 Other cervical disc degeneration, unspecified cervical region: Secondary | ICD-10-CM | POA: Diagnosis not present

## 2020-02-16 DIAGNOSIS — M5136 Other intervertebral disc degeneration, lumbar region: Secondary | ICD-10-CM | POA: Diagnosis not present

## 2020-02-16 DIAGNOSIS — M9901 Segmental and somatic dysfunction of cervical region: Secondary | ICD-10-CM | POA: Diagnosis not present

## 2020-02-16 DIAGNOSIS — M9902 Segmental and somatic dysfunction of thoracic region: Secondary | ICD-10-CM | POA: Diagnosis not present

## 2020-02-21 DIAGNOSIS — M5416 Radiculopathy, lumbar region: Secondary | ICD-10-CM | POA: Diagnosis not present

## 2020-02-23 DIAGNOSIS — M5136 Other intervertebral disc degeneration, lumbar region: Secondary | ICD-10-CM | POA: Diagnosis not present

## 2020-02-23 DIAGNOSIS — M5134 Other intervertebral disc degeneration, thoracic region: Secondary | ICD-10-CM | POA: Diagnosis not present

## 2020-02-23 DIAGNOSIS — M9902 Segmental and somatic dysfunction of thoracic region: Secondary | ICD-10-CM | POA: Diagnosis not present

## 2020-02-23 DIAGNOSIS — M9905 Segmental and somatic dysfunction of pelvic region: Secondary | ICD-10-CM | POA: Diagnosis not present

## 2020-02-23 DIAGNOSIS — M9901 Segmental and somatic dysfunction of cervical region: Secondary | ICD-10-CM | POA: Diagnosis not present

## 2020-03-01 DIAGNOSIS — M9901 Segmental and somatic dysfunction of cervical region: Secondary | ICD-10-CM | POA: Diagnosis not present

## 2020-03-01 DIAGNOSIS — M5030 Other cervical disc degeneration, unspecified cervical region: Secondary | ICD-10-CM | POA: Diagnosis not present

## 2020-03-01 DIAGNOSIS — M9905 Segmental and somatic dysfunction of pelvic region: Secondary | ICD-10-CM | POA: Diagnosis not present

## 2020-03-08 DIAGNOSIS — M9901 Segmental and somatic dysfunction of cervical region: Secondary | ICD-10-CM | POA: Diagnosis not present

## 2020-03-08 DIAGNOSIS — M5134 Other intervertebral disc degeneration, thoracic region: Secondary | ICD-10-CM | POA: Diagnosis not present

## 2020-03-08 DIAGNOSIS — M9902 Segmental and somatic dysfunction of thoracic region: Secondary | ICD-10-CM | POA: Diagnosis not present

## 2020-03-08 DIAGNOSIS — M5136 Other intervertebral disc degeneration, lumbar region: Secondary | ICD-10-CM | POA: Diagnosis not present

## 2020-03-08 DIAGNOSIS — M5030 Other cervical disc degeneration, unspecified cervical region: Secondary | ICD-10-CM | POA: Diagnosis not present

## 2020-03-08 DIAGNOSIS — M9905 Segmental and somatic dysfunction of pelvic region: Secondary | ICD-10-CM | POA: Diagnosis not present

## 2020-03-15 DIAGNOSIS — M5030 Other cervical disc degeneration, unspecified cervical region: Secondary | ICD-10-CM | POA: Diagnosis not present

## 2020-03-15 DIAGNOSIS — M9905 Segmental and somatic dysfunction of pelvic region: Secondary | ICD-10-CM | POA: Diagnosis not present

## 2020-03-15 DIAGNOSIS — M9901 Segmental and somatic dysfunction of cervical region: Secondary | ICD-10-CM | POA: Diagnosis not present

## 2020-03-15 DIAGNOSIS — M5136 Other intervertebral disc degeneration, lumbar region: Secondary | ICD-10-CM | POA: Diagnosis not present

## 2020-03-15 DIAGNOSIS — M9902 Segmental and somatic dysfunction of thoracic region: Secondary | ICD-10-CM | POA: Diagnosis not present

## 2020-03-15 DIAGNOSIS — M5134 Other intervertebral disc degeneration, thoracic region: Secondary | ICD-10-CM | POA: Diagnosis not present

## 2020-03-17 DIAGNOSIS — Z86010 Personal history of colonic polyps: Secondary | ICD-10-CM | POA: Diagnosis not present

## 2020-03-17 DIAGNOSIS — Z1211 Encounter for screening for malignant neoplasm of colon: Secondary | ICD-10-CM | POA: Diagnosis not present

## 2020-03-20 ENCOUNTER — Telehealth: Payer: Self-pay | Admitting: Family Medicine

## 2020-03-20 NOTE — Telephone Encounter (Signed)
Left message for patient to call back and schedule Medicare Annual Wellness Visit (AWV) either virtually or in office. No detailed message left   Last AWV 04/29/17  please schedule at anytime with [facility] Nurse Health Advisor 1 or 2   This should be a 45 minute visit.

## 2020-03-22 DIAGNOSIS — M5416 Radiculopathy, lumbar region: Secondary | ICD-10-CM | POA: Diagnosis not present

## 2020-03-22 DIAGNOSIS — M5126 Other intervertebral disc displacement, lumbar region: Secondary | ICD-10-CM | POA: Diagnosis not present

## 2020-03-23 DIAGNOSIS — M5030 Other cervical disc degeneration, unspecified cervical region: Secondary | ICD-10-CM | POA: Diagnosis not present

## 2020-03-23 DIAGNOSIS — M9901 Segmental and somatic dysfunction of cervical region: Secondary | ICD-10-CM | POA: Diagnosis not present

## 2020-03-23 DIAGNOSIS — M5134 Other intervertebral disc degeneration, thoracic region: Secondary | ICD-10-CM | POA: Diagnosis not present

## 2020-03-23 DIAGNOSIS — M9902 Segmental and somatic dysfunction of thoracic region: Secondary | ICD-10-CM | POA: Diagnosis not present

## 2020-03-23 DIAGNOSIS — M9905 Segmental and somatic dysfunction of pelvic region: Secondary | ICD-10-CM | POA: Diagnosis not present

## 2020-03-23 DIAGNOSIS — M5136 Other intervertebral disc degeneration, lumbar region: Secondary | ICD-10-CM | POA: Diagnosis not present

## 2020-03-28 ENCOUNTER — Telehealth: Payer: Self-pay | Admitting: Family Medicine

## 2020-03-28 DIAGNOSIS — Z888 Allergy status to other drugs, medicaments and biological substances status: Secondary | ICD-10-CM | POA: Diagnosis not present

## 2020-03-28 DIAGNOSIS — E119 Type 2 diabetes mellitus without complications: Secondary | ICD-10-CM | POA: Diagnosis not present

## 2020-03-28 DIAGNOSIS — E785 Hyperlipidemia, unspecified: Secondary | ICD-10-CM | POA: Diagnosis not present

## 2020-03-28 DIAGNOSIS — Z7984 Long term (current) use of oral hypoglycemic drugs: Secondary | ICD-10-CM | POA: Diagnosis not present

## 2020-03-28 DIAGNOSIS — R079 Chest pain, unspecified: Secondary | ICD-10-CM | POA: Diagnosis not present

## 2020-03-28 DIAGNOSIS — R0789 Other chest pain: Secondary | ICD-10-CM | POA: Diagnosis not present

## 2020-03-28 DIAGNOSIS — Z79899 Other long term (current) drug therapy: Secondary | ICD-10-CM | POA: Diagnosis not present

## 2020-03-28 NOTE — Telephone Encounter (Signed)
Left message asking patient to call office.  Please reschedule 04/05/20  Otila Kluver will be out of office

## 2020-03-31 DIAGNOSIS — M9901 Segmental and somatic dysfunction of cervical region: Secondary | ICD-10-CM | POA: Diagnosis not present

## 2020-03-31 DIAGNOSIS — M9905 Segmental and somatic dysfunction of pelvic region: Secondary | ICD-10-CM | POA: Diagnosis not present

## 2020-03-31 DIAGNOSIS — M5030 Other cervical disc degeneration, unspecified cervical region: Secondary | ICD-10-CM | POA: Diagnosis not present

## 2020-04-05 ENCOUNTER — Ambulatory Visit: Payer: Medicare Other

## 2020-04-05 DIAGNOSIS — M5134 Other intervertebral disc degeneration, thoracic region: Secondary | ICD-10-CM | POA: Diagnosis not present

## 2020-04-05 DIAGNOSIS — M9905 Segmental and somatic dysfunction of pelvic region: Secondary | ICD-10-CM | POA: Diagnosis not present

## 2020-04-05 DIAGNOSIS — M9901 Segmental and somatic dysfunction of cervical region: Secondary | ICD-10-CM | POA: Diagnosis not present

## 2020-04-05 DIAGNOSIS — M5030 Other cervical disc degeneration, unspecified cervical region: Secondary | ICD-10-CM | POA: Diagnosis not present

## 2020-04-05 DIAGNOSIS — M9902 Segmental and somatic dysfunction of thoracic region: Secondary | ICD-10-CM | POA: Diagnosis not present

## 2020-04-05 DIAGNOSIS — M5136 Other intervertebral disc degeneration, lumbar region: Secondary | ICD-10-CM | POA: Diagnosis not present

## 2020-04-11 ENCOUNTER — Ambulatory Visit (INDEPENDENT_AMBULATORY_CARE_PROVIDER_SITE_OTHER): Payer: Medicare Other

## 2020-04-11 ENCOUNTER — Other Ambulatory Visit: Payer: Self-pay

## 2020-04-11 DIAGNOSIS — Z0000 Encounter for general adult medical examination without abnormal findings: Secondary | ICD-10-CM

## 2020-04-11 NOTE — Patient Instructions (Addendum)
Todd [name] , Thank you for taking time to come for your Medicare Wellness Visit. I appreciate your ongoing commitment to your health goals. Please review the following plan we discussed and let me know if I can assist you in the future.   Screening recommendations/referrals: Colonoscopy: Done 10/10/10 Recommended yearly ophthalmology/optometry visit for glaucoma screening and checkup Recommended yearly dental visit for hygiene and checkup  Vaccinations: Influenza vaccine: Up to date Pneumococcal vaccine: Up to date Tdap vaccine: Due and discussed Shingles vaccine: Completed 12/21/19 & 03/16/20   Covid-19: Completed 02/13/19 & 03/06/19  Advanced directives: Advance directive discussed with you today. Even though you declined this today please call our office should you change your mind and we can give you the proper paperwork for you to fill out.  Conditions/risks identified: Lose 20lbs  Next appointment: Follow up in one year for your annual wellness visit.   Preventive Care 60 Years and Older, Male Preventive care refers to lifestyle choices and visits with your health care provider that can promote health and wellness. What does preventive care include?  A yearly physical exam. This is also called an annual well check.  Dental exams once or twice a year.  Routine eye exams. Ask your health care provider how often you should have your eyes checked.  Personal lifestyle choices, including:  Daily care of your teeth and gums.  Regular physical activity.  Eating a healthy diet.  Avoiding tobacco and drug use.  Limiting alcohol use.  Practicing safe sex.  Taking low doses of aspirin every day.  Taking vitamin and mineral supplements as recommended by your health care provider. What happens during an annual well check? The services and screenings done by your health care provider during your annual well check will depend on your age, overall health, lifestyle risk factors, and  family history of disease. Counseling  Your health care provider may ask you questions about your:  Alcohol use.  Tobacco use.  Drug use.  Emotional well-being.  Home and relationship well-being.  Sexual activity.  Eating habits.  History of falls.  Memory and ability to understand (cognition).  Work and work Statistician. Screening  You may have the following tests or measurements:  Height, weight, and BMI.  Blood pressure.  Lipid and cholesterol levels. These may be checked every 5 years, or more frequently if you are over 11 years old.  Skin check.  Lung cancer screening. You may have this screening every year starting at age 16 if you have a 30-pack-year history of smoking and currently smoke or have quit within the past 15 years.  Fecal occult blood test (FOBT) of the stool. You may have this test every year starting at age 29.  Flexible sigmoidoscopy or colonoscopy. You may have a sigmoidoscopy every 5 years or a colonoscopy every 10 years starting at age 24.  Prostate cancer screening. Recommendations will vary depending on your family history and other risks.  Hepatitis C blood test.  Hepatitis B blood test.  Sexually transmitted disease (STD) testing.  Diabetes screening. This is done by checking your blood sugar (glucose) after you have not eaten for a while (fasting). You may have this done every 1-3 years.  Abdominal aortic aneurysm (AAA) screening. You may need this if you are a current or former smoker.  Osteoporosis. You may be screened starting at age 80 if you are at high risk. Talk with your health care provider about your test results, treatment options, and if necessary, the need  for more tests. Vaccines  Your health care provider may recommend certain vaccines, such as:  Influenza vaccine. This is recommended every year.  Tetanus, diphtheria, and acellular pertussis (Tdap, Td) vaccine. You may need a Td booster every 10 years.  Zoster  vaccine. You may need this after age 73.  Pneumococcal 13-valent conjugate (PCV13) vaccine. One dose is recommended after age 37.  Pneumococcal polysaccharide (PPSV23) vaccine. One dose is recommended after age 52. Talk to your health care provider about which screenings and vaccines you need and how often you need them. This information is not intended to replace advice given to you by your health care provider. Make sure you discuss any questions you have with your health care provider. Document Released: 01/20/2015 Document Revised: 09/13/2015 Document Reviewed: 10/25/2014 Elsevier Interactive Patient Education  2017 [place] Prevention in the Home Falls can cause injuries. They can happen to people of all ages. There are many things you can do to make your home safe and to help prevent falls. What can I do on the outside of my home?  Regularly fix the edges of walkways and driveways and fix any cracks.  Remove anything that might make you trip as you walk through a door, such as a raised step or threshold.  Trim any bushes or trees on the path to your home.  Use bright outdoor lighting.  Clear any walking paths of anything that might make someone trip, such as rocks or tools.  Regularly check to see if handrails are loose or broken. Make sure that both sides of any steps have handrails.  Any raised decks and porches should have guardrails on the edges.  Have any leaves, snow, or ice cleared regularly.  Use sand or salt on walking paths during winter.  Clean up any spills in your garage right away. This includes oil or grease spills. What can I do in the bathroom?  Use night lights.  Install grab bars by the toilet and in the tub and shower. Do not use towel bars as grab bars.  Use non-skid mats or decals in the tub or shower.  If you need to sit down in the shower, use a plastic, non-slip stool.  Keep the floor dry. Clean up any water that spills on the  floor as soon as it happens.  Remove soap buildup in the tub or shower regularly.  Attach bath mats securely with double-sided non-slip rug tape.  Do not have throw rugs and other things on the floor that can make you trip. What can I do in the bedroom?  Use night lights.  Make sure that you have a light by your bed that is easy to reach.  Do not use any sheets or blankets that are too big for your bed. They should not hang down onto the floor.  Have a firm chair that has side arms. You can use this for support while you get dressed.  Do not have throw rugs and other things on the floor that can make you trip. What can I do in the kitchen?  Clean up any spills right away.  Avoid walking on wet floors.  Keep items that you use a lot in easy-to-reach places.  If you need to reach something above you, use a strong step stool that has a grab bar.  Keep electrical cords out of the way.  Do not use floor polish or wax that makes floors slippery. If you must use wax, use  non-skid floor wax.  Do not have throw rugs and other things on the floor that can make you trip. What can I do with my stairs?  Do not leave any items on the stairs.  Make sure that there are handrails on both sides of the stairs and use them. Fix handrails that are broken or loose. Make sure that handrails are as long as the stairways.  Check any carpeting to make sure that it is firmly attached to the stairs. Fix any carpet that is loose or worn.  Avoid having throw rugs at the top or bottom of the stairs. If you do have throw rugs, attach them to the floor with carpet tape.  Make sure that you have a light switch at the top of the stairs and the bottom of the stairs. If you do not have them, ask someone to add them for you. What else can I do to help prevent falls?  Wear shoes that:  Do not have high heels.  Have rubber bottoms.  Are comfortable and fit you well.  Are closed at the toe. Do not wear  sandals.  If you use a stepladder:  Make sure that it is fully opened. Do not climb a closed stepladder.  Make sure that both sides of the stepladder are locked into place.  Ask someone to hold it for you, if possible.  Clearly mark and make sure that you can see:  Any grab bars or handrails.  First and last steps.  Where the edge of each step is.  Use tools that help you move around (mobility aids) if they are needed. These include:  Canes.  Walkers.  Scooters.  Crutches.  Turn on the lights when you go into a dark area. Replace any light bulbs as soon as they burn out.  Set up your furniture so you have a clear path. Avoid moving your furniture around.  If any of your floors are uneven, fix them.  If there are any pets around you, be aware of where they are.  Review your medicines with your doctor. Some medicines can make you feel dizzy. This can increase your chance of falling. Ask your doctor what other things that you can do to help prevent falls. This information is not intended to replace advice given to you by your health care provider. Make sure you discuss any questions you have with your health care provider. Document Released: 10/20/2008 Document Revised: 06/01/2015 Document Reviewed: 01/28/2014 Elsevier Interactive Patient Education  2017 [employer].

## 2020-04-11 NOTE — Progress Notes (Signed)
Virtual Visit via Telephone Note  I connected with  Todd [name] on 04/11/20 at  8:45 AM EDT by telephone and verified that I am speaking with the correct person using two identifiers.  Medicare Annual Wellness visit completed telephonically due to Covid-19 pandemic.   Persons participating in this call: This Health Coach and this patient.   Location: Patient: Home Provider: Office   I discussed the limitations, risks, security and privacy concerns of performing an evaluation and management service by telephone and the availability of in person appointments. The patient expressed understanding and agreed to proceed.  Unable to perform video visit due to video visit attempted and failed and/or patient does not have video capability.   Some vital signs may be absent or patient reported.   Willette Brace, LPN    Subjective:   Todd [name] is a 75 y.o. male who presents for Medicare Annual/Subsequent preventive examination.  Review of Systems     Cardiac Risk Factors include: advanced age (>72men, >80 women);diabetes mellitus;hypertension;male gender;dyslipidemia     Objective:    There were no vitals filed for this visit. There is no height or weight on file to calculate BMI.  Advanced Directives 04/11/2020 04/29/2017  Does Patient Have a Medical Advance Directive? No No  Would patient like information on creating a medical advance directive? No - Patient declined -    Current Medications (verified) Outpatient Encounter Medications as of 04/11/2020  Medication Sig  . Coenzyme Q10 (CO Q 10 PO) Take 200 mg by mouth daily.  . fenofibrate 160 MG tablet TAKE 1 TABLET BY MOUTH  DAILY  . metFORMIN (GLUCOPHAGE) 1000 MG tablet TAKE 1 TABLET BY MOUTH  TWICE DAILY WITH MEALS  . polycarbophil (FIBERCON) 625 MG tablet Take 625 mg by mouth daily. Taking 2 Tablet  . rosuvastatin (CRESTOR) 10 MG tablet TAKE 1 TABLET BY MOUTH  DAILY  . TRULICITY 2.72 ZD/6.6YQ SOPN INJECT 0.75 MG INTO THE   SKIN WEEKLY  . vitamin B-12 (CYANOCOBALAMIN) 1000 MCG tablet Take 1,000 mcg by mouth daily.  Marland Kitchen VITAMIN D, ERGOCALCIFEROL, PO Take 2,000 Units by mouth daily.   . [DISCONTINUED] omeprazole (PRILOSEC) 40 MG capsule 1 cap(s) (Patient not taking: Reported on 04/11/2020)  . [DISCONTINUED] pantoprazole (PROTONIX) 20 MG tablet Take 1 tablet (20 mg total) by mouth daily. (Patient not taking: Reported on 04/11/2020)   No facility-administered encounter medications on file as of 04/11/2020.    Allergies (verified) Jardiance [empagliflozin], Diphenhydramine-phenylephrine, Lisinopril, Pseudoephedrine hcl, Sudafed pe sinus cong day-nght  [diphenhydramine-phenylephrine], Triprolidine-pse, Actifed cold-sinus, and Pseudoephedrine   History: Past Medical History:  Diagnosis Date  . DIABETES MELLITUS, TYPE II 08/08/2009  . Hiatal hernia   . History of nuclear stress test    Myoview 02/2019: EF 54, apical inf and apical artifact, normal perfusion, low risk  . HYPERLIPIDEMIA 08/08/2009  . Palpitations   . URINARY INCONTINENCE 08/08/2009   Past Surgical History:  Procedure Laterality Date  . ACHILLES TENDON REPAIR  2002   rupture  . APPENDECTOMY  1976  . CARDIOVASCULAR STRESS TEST  11-02-2003   EF 57%  . TONSILLECTOMY  1961  . US ECHOCARDIOGRAPHY  02-10-2001   EF 60-65%   Family History  Problem Relation Age of Onset  . Mental illness Mother   . Diabetes Sister        type ll   Social History   Socioeconomic History  . Marital status: Married    Spouse name: Not on file  . Number  of children: Not on file  . Years of education: Not on file  . Highest education level: Not on file  Occupational History  . Not on file  Tobacco Use  . Smoking status: Former Smoker    Packs/day: 2.00    Years: 10.00    Pack years: 20.00    Types: Cigarettes    Quit date: 04/19/1977    Years since quitting: 43.0  . Smokeless tobacco: Never Used  . Tobacco comment: discussed AAA but think he has had this  Vaping Use   . Vaping Use: Never used  Substance and Sexual Activity  . Alcohol use: Yes    Comment:  glass of wine x 2 per week  . Drug use: No  . Sexual activity: Not on file  Other Topics Concern  . Not on file  Social History Narrative  . Not on file   Social Determinants of Health   Financial Resource Strain: Low Risk   . Difficulty of Paying Living Expenses: Not hard at all  Food Insecurity: No Food Insecurity  . Worried About Charity fundraiser in the Last Year: Never true  . Ran Out of Food in the Last Year: Never true  Transportation Needs: No Transportation Needs  . Lack of Transportation (Medical): No  . Lack of Transportation (Non-Medical): No  Physical Activity: Insufficiently Active  . Days of Exercise per Week: 2 days  . Minutes of Exercise per Session: 60 min  Stress: No Stress Concern Present  . Feeling of Stress : Not at all  Social Connections: Socially Integrated  . Frequency of Communication with Friends and Family: More than three times a week  . Frequency of Social Gatherings with Friends and Family: More than three times a week  . Attends Religious Services: More than 4 times per year  . Active Member of Clubs or Organizations: Yes  . Attends Archivist Meetings: Never  . Marital Status: Married    Tobacco Counseling Counseling given: Not Answered Comment: discussed AAA but think he has had this   Clinical Intake:  Pre-visit preparation completed: Yes  Pain : No/denies pain     BMI - recorded: 28.12 Nutritional Status: BMI 25 -29 Overweight Nutritional Risks: None Diabetes: Yes CBG done?: No Did pt. bring in CBG monitor from home?: No  How often do you need to have someone help you when you read instructions, pamphlets, or other written materials from your doctor or pharmacy?: 1 - Never  Diabetic?Nutrition Risk Assessment:  Has the patient had any N/V/D within the last 2 months?  No  Does the patient have any non-healing wounds?  No   Has the patient had any unintentional weight loss or weight gain?  No   Diabetes:  Is the patient diabetic?  Yes  If diabetic, was a CBG obtained today?  No  Did the patient bring in their glucometer from home?  No  How often do you monitor your CBG's? N/A.   Financial Strains and Diabetes Management:  Are you having any financial strains with the device, your supplies or your medication? No .  Does the patient want to be seen by Chronic Care Management for management of their diabetes?  No  Would the patient like to be referred to a Nutritionist or for Diabetic Management?  No   Diabetic Exams:  Diabetic Eye Exam: Completed 02/07/20 Diabetic Foot Exam: Overdue, Pt has been advised about the importance in completing this exam. Pt is scheduled for  diabetic foot exam on next appt .   Interpreter Needed?: No  Information entered by :: Charlott Rakes, LPN   Activities of Daily Living In your present state of health, do you have any difficulty performing the following activities: 04/11/2020  Hearing? N  Vision? N  Difficulty concentrating or making decisions? N  Walking or climbing stairs? N  Dressing or bathing? N  Doing errands, shopping? N  Preparing Food and eating ? N  Using the Toilet? N  In the past six months, have you accidently leaked urine? N  Do you have problems with loss of bowel control? N  Managing your Medications? N  Managing your Finances? N  Some recent data might be hidden    Patient Care Team: [name], MD as PCP - General [name], [name], MD as PCP - Cardiology (Cardiology) Viona Gilmore, [facility] as Pharmacist (Pharmacist)  Indicate any recent Medical Services you may have received from other than [facility] providers in the past year (date may be approximate).     Assessment:   This is a routine wellness examination for Todd [name].  Hearing/Vision screen  Hearing Screening   125Hz  250Hz  500Hz  1000Hz  2000Hz  3000Hz  4000Hz  6000Hz  8000Hz   Right  ear:           Left ear:           Comments: Pt denies any hearing issues   Vision Screening Comments: Pt follows up with [facility]   Dietary issues and exercise activities discussed: Current Exercise Habits: Home exercise routine, Type of exercise: stretching, Time (Minutes): 60, Frequency (Times/Week): 2, Weekly Exercise (Minutes/Week): 120  Goals    . Exercise 150 min/wk Moderate Activity     Likes to power walk and bike Wife likes to walk as well Has a 2 mile track at his home.     . Exercise 150 minutes per week (moderate activity)     Walking 10000 steps per day!!!    . Patient Stated     Lose 20lbs     . Pharmacy Care Plan     CARE PLAN ENTRY (see longitudinal plan of care for additional care plan information)  Current Barriers:  . Chronic Disease Management support, education, and care coordination needs related to Hypertension, Hyperlipidemia, Diabetes, and GERD   Hypertension BP Readings from Last 3 Encounters:  10/11/19 138/78  07/15/19 122/70  04/21/19 118/72   . Pharmacist Clinical Goal(s): o Over the next 180 days, patient will work with PharmD and providers to maintain BP goal <130/80 . Current regimen:  o No medications . Interventions: o Discussed DASH eating plan recommendations: . Emphasizes vegetables, fruits, and whole-grains . Includes fat-free or low-fat dairy products, fish, poultry, beans, nuts, and vegetable oils . Limits foods that are high in saturated fat. These foods include fatty meats, full-fat dairy products, and tropical oils such as coconut, palm kernel, and palm oils. . Limits sugar-sweetened beverages and sweets . Limiting sodium intake to < 1500 mg/day . Patient self care activities - Over the next 180 days, patient will: o Check blood pressure as directed, document, and provide at future appointments o Ensure daily salt intake < 2300 mg/day  Hyperlipidemia Lab Results  Component Value Date/Time   LDLCALC 72  01/14/2018 11:46 AM   LDLDIRECT 84.0 06/11/2018 10:52 AM   . Pharmacist Clinical Goal(s): o Over the next 180 days, patient will work with PharmD and providers to maintain LDL goal < 100 . Current regimen:  .  Rosuvastatin 10 mg 1 tablet daily . Fenofibrate 160 mg 1 tablet daily . Interventions: o Discussed how triglycerides can increase when: . Eating eat too much food . Eating high-fat foods such as fried foods, red meat, chicken skin, egg yolks, high-fat dairy, butter, lard, shortening, margarine, and fast food . Eating foods high in simple carbohydrates such as fresh and canned fruit, candy, ice cream and sweetened yogurt, sweetened drinks like juices, cereal, jams, foods and drinks with corn syrup, or sugar listed as the first ingredient . Drinking alcohol   . Patient self care activities - Over the next 180 days, patient will: o Continue current medications  Diabetes Lab Results  Component Value Date/Time   HGBA1C 8.4 (A) 10/11/2019 03:18 PM   HGBA1C 8.1 (A) 07/15/2019 10:34 AM   HGBA1C 7.4 (A) 08/31/2018 04:03 PM   HGBA1C 9.4 (H) 06/11/2018 10:55 AM   HGBA1C 8.5 (H) 04/29/2017 02:32 PM   . Pharmacist Clinical Goal(s): o Over the next 180 days, patient will work with PharmD and providers to achieve A1c goal <7% . Current regimen:  . Trulicity 7.51 mg inject once weekly . Metformin 1000 mg 1 tablet twice daily . Interventions: o Discussed recommendations for moderate aerobic exercise for 150 minutes/week spread out over 5 days for heart healthy lifestyle o Discussed following the healthy plate method which includes: . Fill half of your plate with nonstarchy vegetables, such as spinach, broccoli, carrots and tomatoes. [name] a quarter of your plate with a protein, such as tuna, lean pork or chicken. [name] the last quarter with a whole-grain item, such as brown rice, or a starchy vegetable, such as green peas or potatoes. . Include "good" fats such as nuts or avocados in small  amounts. . Patient self care activities - Over the next 180 days, patient will: o Check blood sugar as directed, document, and provide at future appointments o Contact provider with any episodes of hypoglycemia  GERD . Pharmacist Clinical Goal(s) o Over the next 180 days, patient will work with PharmD and providers to manage symptoms of heartburn . Current regimen:  o Pantoprazole 20 mg 1 tablet daily  . Interventions: o Discussed non-pharmacologic management of symptoms such as elevating the head of your bed, avoiding eating 2-3 hours before bed, avoiding triggering foods such as acidic, spicy, or fatty foods, eating smaller meals, and wearing clothes that are loose around the waist  . Patient self care activities - Over the next 180 days, patient will: o Continue current medications  Medication management . Pharmacist Clinical Goal(s): o Over the next 180 days, patient will work with PharmD and providers to achieve optimal medication adherence . Current pharmacy: [facility] . Interventions o Comprehensive medication review performed. o Continue current medication management strategy . Patient self care activities - Over the next 180 days, patient will: o Take medications as prescribed o Report any questions or concerns to PharmD and/or provider(s)  Initial goal documentation       Depression Screen PHQ 2/9 Scores 04/11/2020 04/21/2019 04/29/2017 01/12/2016 09/25/2015 09/19/2014 06/09/2013  PHQ - 2 Score 0 0 0 0 0 0 0    Fall Risk Fall Risk  04/11/2020 04/21/2019 04/29/2017 01/12/2016 09/25/2015  Falls in the past year? 0 0 No No No  Number falls in past yr: 0 0 - - -  Injury with Fall? 0 0 - - -  Risk for fall due to : Impaired vision - - - -  Follow up Falls prevention discussed Falls  evaluation completed - - -    FALL RISK PREVENTION PERTAINING TO THE HOME:  Any stairs in or around the home? Yes  If so, are there any without handrails? No  Home free of loose throw rugs in walkways,  pet beds, electrical cords, etc? Yes  Adequate lighting in your home to reduce risk of falls? Yes   ASSISTIVE DEVICES UTILIZED TO PREVENT FALLS:  Life alert? No  Use of a cane, walker or w/c? No  Grab bars in the bathroom? No  Shower chair or bench in shower? No  Elevated toilet seat or a handicapped toilet? No   TIMED UP AND GO:  Was the test performed? No     Cognitive Function: MMSE - Mini Mental State Exam 04/29/2017 01/12/2016  Not completed: (No Data) (No Data)     6CIT Screen 04/11/2020  What Year? 0 points  What month? 0 points  Count back from 20 0 points  Months in reverse 0 points  Repeat phrase 0 points    Immunizations Immunization History  Administered Date(s) Administered  . Fluad Quad(high Dose 65+) 08/31/2018, 10/11/2019  . Influenza Split 10/08/2010, 10/14/2011  . Influenza Whole 09/22/2009  . Influenza, High Dose Seasonal PF 09/25/2015, 10/11/2016  . Influenza,inj,Quad PF,6+ Mos 10/02/2012, 09/06/2013, 09/19/2014, 09/29/2017  . PFIZER(Purple Top)SARS-COV-2 Vaccination 02/13/2019, 03/06/2019  . Pneumococcal Conjugate-13 12/14/2013  . Pneumococcal Polysaccharide-23 10/14/2011  . Tdap 11/08/2007  . Zoster 11/08/2007  . Zoster Recombinat (Shingrix) 12/21/2019, 03/16/2020    TDAP status: Due, Education has been provided regarding the importance of this vaccine. Advised may receive this vaccine at local pharmacy or Health Dept. Aware to provide a copy of the vaccination record if obtained from local pharmacy or Health Dept. Verbalized acceptance and understanding.  Flu Vaccine status: Up to date  Pneumococcal vaccine status: Up to date  Covid-19 vaccine status: Completed vaccines  Qualifies for Shingles Vaccine? Yes   Zostavax completed Yes   Shingrix Completed?: No.    Education has been provided regarding the importance of this vaccine. Patient has been advised to call insurance company to determine out of pocket expense if they have not yet received  this vaccine. Advised may also receive vaccine at local pharmacy or Health Dept. Verbalized acceptance and understanding.  Screening Tests Health Maintenance  Topic Date Due  . TETANUS/TDAP  11/07/2017  . URINE MICROALBUMIN  04/30/2018  . COVID-19 Vaccine (3 - Booster for Pfizer series) 09/03/2019  . FOOT EXAM  12/28/2019  . HEMOGLOBIN A1C  08/06/2020  . INFLUENZA VACCINE  08/07/2020  . COLONOSCOPY (Pts 45-41yrs Insurance coverage will need to be confirmed)  10/09/2020  . OPHTHALMOLOGY EXAM  02/06/2021  . Hepatitis C Screening  Completed  . PNA vac Low Risk Adult  Completed  . HPV VACCINES  Aged Out    Health Maintenance  Health Maintenance Due  Topic Date Due  . TETANUS/TDAP  11/07/2017  . URINE MICROALBUMIN  04/30/2018  . COVID-19 Vaccine (3 - Booster for Pfizer series) 09/03/2019  . FOOT EXAM  12/28/2019    Colorectal cancer screening: Type of screening: Colonoscopy. Completed 10/10/10. Repeat every 10 years   Additional Screening:  Hepatitis C Screening:  Completed 01/12/16  Vision Screening: Recommended annual ophthalmology exams for early detection of glaucoma and other disorders of the eye. Is the patient up to date with their annual eye exam?  Yes  Who is the provider or what is the name of the office in which the patient attends annual eye exams?  [facility] If pt is not established with a provider, would they like to be referred to a provider to establish care? No .   Dental Screening: Recommended annual dental exams for proper oral hygiene  Community Resource Referral / Chronic Care Management: CRR required this visit?  No   CCM required this visit?  No      Plan:     I have personally reviewed and noted the following in the patient's chart:   . Medical and social history . Use of alcohol, tobacco or illicit drugs  . Current medications and supplements . Functional ability and status . Nutritional status . Physical  activity . Advanced directives . List of other physicians . Hospitalizations, surgeries, and ER visits in previous 12 months . Vitals . Screenings to include cognitive, depression, and falls . Referrals and appointments  In addition, I have reviewed and discussed with patient certain preventive protocols, quality metrics, and best practice recommendations. A written personalized care plan for preventive services as well as general preventive health recommendations were provided to patient.     Willette Brace, LPN   07/14/338   Nurse Notes: None

## 2020-04-12 DIAGNOSIS — M5134 Other intervertebral disc degeneration, thoracic region: Secondary | ICD-10-CM | POA: Diagnosis not present

## 2020-04-12 DIAGNOSIS — M9905 Segmental and somatic dysfunction of pelvic region: Secondary | ICD-10-CM | POA: Diagnosis not present

## 2020-04-12 DIAGNOSIS — M9902 Segmental and somatic dysfunction of thoracic region: Secondary | ICD-10-CM | POA: Diagnosis not present

## 2020-04-12 DIAGNOSIS — M5030 Other cervical disc degeneration, unspecified cervical region: Secondary | ICD-10-CM | POA: Diagnosis not present

## 2020-04-12 DIAGNOSIS — M5136 Other intervertebral disc degeneration, lumbar region: Secondary | ICD-10-CM | POA: Diagnosis not present

## 2020-04-12 DIAGNOSIS — M9901 Segmental and somatic dysfunction of cervical region: Secondary | ICD-10-CM | POA: Diagnosis not present

## 2020-04-19 DIAGNOSIS — M5134 Other intervertebral disc degeneration, thoracic region: Secondary | ICD-10-CM | POA: Diagnosis not present

## 2020-04-19 DIAGNOSIS — M5136 Other intervertebral disc degeneration, lumbar region: Secondary | ICD-10-CM | POA: Diagnosis not present

## 2020-04-19 DIAGNOSIS — M9902 Segmental and somatic dysfunction of thoracic region: Secondary | ICD-10-CM | POA: Diagnosis not present

## 2020-04-19 DIAGNOSIS — M9905 Segmental and somatic dysfunction of pelvic region: Secondary | ICD-10-CM | POA: Diagnosis not present

## 2020-04-19 DIAGNOSIS — M5030 Other cervical disc degeneration, unspecified cervical region: Secondary | ICD-10-CM | POA: Diagnosis not present

## 2020-04-19 DIAGNOSIS — M9901 Segmental and somatic dysfunction of cervical region: Secondary | ICD-10-CM | POA: Diagnosis not present

## 2020-04-26 DIAGNOSIS — M9905 Segmental and somatic dysfunction of pelvic region: Secondary | ICD-10-CM | POA: Diagnosis not present

## 2020-04-26 DIAGNOSIS — M5136 Other intervertebral disc degeneration, lumbar region: Secondary | ICD-10-CM | POA: Diagnosis not present

## 2020-04-26 DIAGNOSIS — M9902 Segmental and somatic dysfunction of thoracic region: Secondary | ICD-10-CM | POA: Diagnosis not present

## 2020-04-26 DIAGNOSIS — M5134 Other intervertebral disc degeneration, thoracic region: Secondary | ICD-10-CM | POA: Diagnosis not present

## 2020-04-26 DIAGNOSIS — M9901 Segmental and somatic dysfunction of cervical region: Secondary | ICD-10-CM | POA: Diagnosis not present

## 2020-05-05 DIAGNOSIS — M9901 Segmental and somatic dysfunction of cervical region: Secondary | ICD-10-CM | POA: Diagnosis not present

## 2020-05-05 DIAGNOSIS — M9905 Segmental and somatic dysfunction of pelvic region: Secondary | ICD-10-CM | POA: Diagnosis not present

## 2020-05-05 DIAGNOSIS — M5030 Other cervical disc degeneration, unspecified cervical region: Secondary | ICD-10-CM | POA: Diagnosis not present

## 2020-05-11 DIAGNOSIS — M9902 Segmental and somatic dysfunction of thoracic region: Secondary | ICD-10-CM | POA: Diagnosis not present

## 2020-05-11 DIAGNOSIS — M5134 Other intervertebral disc degeneration, thoracic region: Secondary | ICD-10-CM | POA: Diagnosis not present

## 2020-05-11 DIAGNOSIS — M9905 Segmental and somatic dysfunction of pelvic region: Secondary | ICD-10-CM | POA: Diagnosis not present

## 2020-05-11 DIAGNOSIS — M5136 Other intervertebral disc degeneration, lumbar region: Secondary | ICD-10-CM | POA: Diagnosis not present

## 2020-05-11 DIAGNOSIS — M5030 Other cervical disc degeneration, unspecified cervical region: Secondary | ICD-10-CM | POA: Diagnosis not present

## 2020-05-11 DIAGNOSIS — M9901 Segmental and somatic dysfunction of cervical region: Secondary | ICD-10-CM | POA: Diagnosis not present

## 2020-05-16 ENCOUNTER — Telehealth: Payer: Self-pay | Admitting: Pharmacist

## 2020-05-16 DIAGNOSIS — K640 First degree hemorrhoids: Secondary | ICD-10-CM | POA: Diagnosis not present

## 2020-05-16 DIAGNOSIS — Z1211 Encounter for screening for malignant neoplasm of colon: Secondary | ICD-10-CM | POA: Diagnosis not present

## 2020-05-16 DIAGNOSIS — Z86010 Personal history of colonic polyps: Secondary | ICD-10-CM | POA: Diagnosis not present

## 2020-05-16 DIAGNOSIS — K635 Polyp of colon: Secondary | ICD-10-CM | POA: Diagnosis not present

## 2020-05-16 DIAGNOSIS — D125 Benign neoplasm of sigmoid colon: Secondary | ICD-10-CM | POA: Diagnosis not present

## 2020-05-16 LAB — HM COLONOSCOPY

## 2020-05-16 NOTE — Chronic Care Management (AMB) (Signed)
I left the patient a message about his upcoming appointment on 05/17/2020 @ 2:00 pm with the clinical pharmacist. Hewas asked to please have all medication on hand to review with the pharmacist.   Neita Goodnight) Mare Ferrari, [place] [phone]

## 2020-05-16 NOTE — Chronic Care Management (AMB) (Signed)
The patient returned the call. Unfortunately, he will not be able to make this appointment. A new appointment was made for June 13th at 2:00 PM.

## 2020-05-17 ENCOUNTER — Telehealth: Payer: Medicare Other

## 2020-05-18 DIAGNOSIS — M9905 Segmental and somatic dysfunction of pelvic region: Secondary | ICD-10-CM | POA: Diagnosis not present

## 2020-05-18 DIAGNOSIS — M5030 Other cervical disc degeneration, unspecified cervical region: Secondary | ICD-10-CM | POA: Diagnosis not present

## 2020-05-18 DIAGNOSIS — M5136 Other intervertebral disc degeneration, lumbar region: Secondary | ICD-10-CM | POA: Diagnosis not present

## 2020-05-18 DIAGNOSIS — M9902 Segmental and somatic dysfunction of thoracic region: Secondary | ICD-10-CM | POA: Diagnosis not present

## 2020-05-18 DIAGNOSIS — M9901 Segmental and somatic dysfunction of cervical region: Secondary | ICD-10-CM | POA: Diagnosis not present

## 2020-05-18 DIAGNOSIS — M5134 Other intervertebral disc degeneration, thoracic region: Secondary | ICD-10-CM | POA: Diagnosis not present

## 2020-05-25 DIAGNOSIS — M5134 Other intervertebral disc degeneration, thoracic region: Secondary | ICD-10-CM | POA: Diagnosis not present

## 2020-05-25 DIAGNOSIS — M9902 Segmental and somatic dysfunction of thoracic region: Secondary | ICD-10-CM | POA: Diagnosis not present

## 2020-05-25 DIAGNOSIS — M5136 Other intervertebral disc degeneration, lumbar region: Secondary | ICD-10-CM | POA: Diagnosis not present

## 2020-05-25 DIAGNOSIS — M9901 Segmental and somatic dysfunction of cervical region: Secondary | ICD-10-CM | POA: Diagnosis not present

## 2020-05-25 DIAGNOSIS — M9905 Segmental and somatic dysfunction of pelvic region: Secondary | ICD-10-CM | POA: Diagnosis not present

## 2020-05-31 DIAGNOSIS — M5134 Other intervertebral disc degeneration, thoracic region: Secondary | ICD-10-CM | POA: Diagnosis not present

## 2020-05-31 DIAGNOSIS — M5030 Other cervical disc degeneration, unspecified cervical region: Secondary | ICD-10-CM | POA: Diagnosis not present

## 2020-05-31 DIAGNOSIS — M9902 Segmental and somatic dysfunction of thoracic region: Secondary | ICD-10-CM | POA: Diagnosis not present

## 2020-05-31 DIAGNOSIS — M9901 Segmental and somatic dysfunction of cervical region: Secondary | ICD-10-CM | POA: Diagnosis not present

## 2020-05-31 DIAGNOSIS — M5136 Other intervertebral disc degeneration, lumbar region: Secondary | ICD-10-CM | POA: Diagnosis not present

## 2020-05-31 DIAGNOSIS — M9905 Segmental and somatic dysfunction of pelvic region: Secondary | ICD-10-CM | POA: Diagnosis not present

## 2020-06-01 DIAGNOSIS — M5416 Radiculopathy, lumbar region: Secondary | ICD-10-CM | POA: Diagnosis not present

## 2020-06-07 DIAGNOSIS — M5030 Other cervical disc degeneration, unspecified cervical region: Secondary | ICD-10-CM | POA: Diagnosis not present

## 2020-06-07 DIAGNOSIS — M9902 Segmental and somatic dysfunction of thoracic region: Secondary | ICD-10-CM | POA: Diagnosis not present

## 2020-06-07 DIAGNOSIS — M9901 Segmental and somatic dysfunction of cervical region: Secondary | ICD-10-CM | POA: Diagnosis not present

## 2020-06-07 DIAGNOSIS — M5136 Other intervertebral disc degeneration, lumbar region: Secondary | ICD-10-CM | POA: Diagnosis not present

## 2020-06-07 DIAGNOSIS — M9905 Segmental and somatic dysfunction of pelvic region: Secondary | ICD-10-CM | POA: Diagnosis not present

## 2020-06-07 DIAGNOSIS — M5134 Other intervertebral disc degeneration, thoracic region: Secondary | ICD-10-CM | POA: Diagnosis not present

## 2020-06-14 DIAGNOSIS — M5134 Other intervertebral disc degeneration, thoracic region: Secondary | ICD-10-CM | POA: Diagnosis not present

## 2020-06-14 DIAGNOSIS — M5136 Other intervertebral disc degeneration, lumbar region: Secondary | ICD-10-CM | POA: Diagnosis not present

## 2020-06-14 DIAGNOSIS — M9901 Segmental and somatic dysfunction of cervical region: Secondary | ICD-10-CM | POA: Diagnosis not present

## 2020-06-14 DIAGNOSIS — M9902 Segmental and somatic dysfunction of thoracic region: Secondary | ICD-10-CM | POA: Diagnosis not present

## 2020-06-14 DIAGNOSIS — M5030 Other cervical disc degeneration, unspecified cervical region: Secondary | ICD-10-CM | POA: Diagnosis not present

## 2020-06-14 DIAGNOSIS — M9905 Segmental and somatic dysfunction of pelvic region: Secondary | ICD-10-CM | POA: Diagnosis not present

## 2020-06-19 ENCOUNTER — Telehealth: Payer: Medicare Other

## 2020-06-19 DIAGNOSIS — M5136 Other intervertebral disc degeneration, lumbar region: Secondary | ICD-10-CM | POA: Diagnosis not present

## 2020-06-19 DIAGNOSIS — M9902 Segmental and somatic dysfunction of thoracic region: Secondary | ICD-10-CM | POA: Diagnosis not present

## 2020-06-19 DIAGNOSIS — M9905 Segmental and somatic dysfunction of pelvic region: Secondary | ICD-10-CM | POA: Diagnosis not present

## 2020-06-19 DIAGNOSIS — M5030 Other cervical disc degeneration, unspecified cervical region: Secondary | ICD-10-CM | POA: Diagnosis not present

## 2020-06-19 DIAGNOSIS — M5134 Other intervertebral disc degeneration, thoracic region: Secondary | ICD-10-CM | POA: Diagnosis not present

## 2020-06-19 DIAGNOSIS — M9901 Segmental and somatic dysfunction of cervical region: Secondary | ICD-10-CM | POA: Diagnosis not present

## 2020-06-24 DIAGNOSIS — M5030 Other cervical disc degeneration, unspecified cervical region: Secondary | ICD-10-CM | POA: Diagnosis not present

## 2020-06-24 DIAGNOSIS — M9905 Segmental and somatic dysfunction of pelvic region: Secondary | ICD-10-CM | POA: Diagnosis not present

## 2020-06-24 DIAGNOSIS — M9901 Segmental and somatic dysfunction of cervical region: Secondary | ICD-10-CM | POA: Diagnosis not present

## 2020-06-27 DIAGNOSIS — M47816 Spondylosis without myelopathy or radiculopathy, lumbar region: Secondary | ICD-10-CM | POA: Diagnosis not present

## 2020-06-27 DIAGNOSIS — M5136 Other intervertebral disc degeneration, lumbar region: Secondary | ICD-10-CM | POA: Diagnosis not present

## 2020-06-28 DIAGNOSIS — M5136 Other intervertebral disc degeneration, lumbar region: Secondary | ICD-10-CM | POA: Diagnosis not present

## 2020-06-28 DIAGNOSIS — M9902 Segmental and somatic dysfunction of thoracic region: Secondary | ICD-10-CM | POA: Diagnosis not present

## 2020-06-28 DIAGNOSIS — M9901 Segmental and somatic dysfunction of cervical region: Secondary | ICD-10-CM | POA: Diagnosis not present

## 2020-06-28 DIAGNOSIS — M9905 Segmental and somatic dysfunction of pelvic region: Secondary | ICD-10-CM | POA: Diagnosis not present

## 2020-06-28 DIAGNOSIS — M5030 Other cervical disc degeneration, unspecified cervical region: Secondary | ICD-10-CM | POA: Diagnosis not present

## 2020-06-28 DIAGNOSIS — M5134 Other intervertebral disc degeneration, thoracic region: Secondary | ICD-10-CM | POA: Diagnosis not present

## 2020-06-29 ENCOUNTER — Telehealth: Payer: Self-pay | Admitting: Pharmacist

## 2020-06-29 NOTE — Chronic Care Management (AMB) (Signed)
Date- Patient called to remind of appointment with Watt Climes on 06.24.2022 at 10:30 am.  Patient aware of appointment date, time, and type of appointment (telephone). Patient aware to have/bring all medications, supplements, blood pressure and/or blood sugar logs to visit.  Questions: Have you had any recent office visit or specialist visit outside of [place]? No  Are there any concerns you would like to discuss during your office visit? No  Are you having any problems obtaining your medications? (Whether it pharmacy issues or cost) No  Star Rating Drug: Medication Dispensed  Quantity Pharmacy  Rosuvastatin 10 mg 06.20.2022 90 [facility]  Metformin 1000 mg 04.12.2022 [facility]    Any gaps in medications fill history? No    Maia Breslow, [place] Pharmacist Assistant [phone]

## 2020-06-30 ENCOUNTER — Ambulatory Visit (INDEPENDENT_AMBULATORY_CARE_PROVIDER_SITE_OTHER): Payer: Medicare Other | Admitting: Pharmacist

## 2020-06-30 DIAGNOSIS — I10 Essential (primary) hypertension: Secondary | ICD-10-CM | POA: Diagnosis not present

## 2020-06-30 DIAGNOSIS — E119 Type 2 diabetes mellitus without complications: Secondary | ICD-10-CM | POA: Diagnosis not present

## 2020-06-30 NOTE — Progress Notes (Signed)
Chronic Care Management Pharmacy Note  07/07/2020 Name:  Todd [name] MRN:  [id number] DOB:  11/19/1945  Summary: A1c is not at goal < 7%  Recommendations/Changes made from today's visit: -Recommend increasing Trulicity to 1.5 mg if S0Y is above goal at next check -Recommended for patient to check blood sugars at home to assist in lifestyle modifications -Recommend urine microalbumin as it is overdue  Plan: Follow up for DM assessment in 2 months   Subjective: Todd [name] is an 75 y.o. year old male who is a primary patient of Burchette, Alinda Sierras, MD.  The CCM team was consulted for assistance with disease management and care coordination needs.    Engaged with patient by telephone for follow up visit in response to provider referral for pharmacy case management and/or care coordination services.   Consent to Services:  The patient was given information about Chronic Care Management services, agreed to services, and gave verbal consent prior to initiation of services.  Please see initial visit note for detailed documentation.   Patient Care Team: [name], MD as PCP - General [name], [name], MD as PCP - Cardiology (Cardiology) Viona Gilmore, [facility] as Pharmacist (Pharmacist)  Recent office visits: 04/11/20 Charlott Rakes, LPN: Patient presented for AWV.  02/07/20 Carolann Littler, MD: Patient presented for abdominal pain and DM follow up. A1c improved to 7.6%.   Recent consult visits: 03/22/20 Leonie Green, NP (neurosurgery): Unable to access notes.  02/07/20 Mertie Moores, MD (cardiology): Patient presented for HTN and HLD follow up. No medication changes.  Hospital visits: 03/28/20 Patient presented to the ED with chest pain.    Objective:  Lab Results  Component Value Date   CREATININE 0.94 02/07/2020   BUN 19 02/07/2020   GFR 80.48 02/07/2020   GFRNONAA 73 01/14/2018   GFRAA 84 01/14/2018   NA 139 02/07/2020   K 4.7 02/07/2020   CALCIUM 10.3  02/07/2020   CO2 28 02/07/2020   GLUCOSE 118 (H) 02/07/2020    Lab Results  Component Value Date/Time   HGBA1C 7.6 (A) 02/07/2020 11:10 AM   HGBA1C 8.4 (A) 10/11/2019 03:18 PM   HGBA1C 7.4 (A) 08/31/2018 04:03 PM   HGBA1C 9.4 (H) 06/11/2018 10:55 AM   HGBA1C 8.5 (H) 04/29/2017 02:32 PM   GFR 80.48 02/07/2020 11:35 AM   GFR 87.93 06/11/2018 10:52 AM   MICROALBUR <0.7 04/29/2017 02:32 PM   MICROALBUR 0.5 01/12/2016 09:54 AM    Last diabetic Eye exam:  Lab Results  Component Value Date/Time   HMDIABEYEEXA No Retinopathy 02/07/2020 12:00 AM    Last diabetic Foot exam:  Lab Results  Component Value Date/Time   HMDIABFOOTEX  normal 09/19/2014 12:00 AM     Lab Results  Component Value Date   CHOL 141 02/07/2020   HDL 37.30 (L) 02/07/2020   LDLCALC 63 02/07/2020   LDLDIRECT 84.0 06/11/2018   TRIG 200.0 (H) 02/07/2020   CHOLHDL 4 02/07/2020    Hepatic Function Latest Ref Rng & Units 02/07/2020 06/11/2018 01/14/2018  Total Protein 6.0 - 8.3 g/dL 7.7 7.5 7.2  Albumin 3.5 - 5.2 g/dL 4.7 4.5 4.5  AST 0 - 37 U/L $Remo'13 15 17  'bfDTd$ ALT 0 - 53 U/L $Remo'23 31 28  'WQMAJ$ Alk Phosphatase 39 - 117 U/L 53 53 60  Total Bilirubin 0.2 - 1.2 mg/dL 0.4 0.3 <0.2  Bilirubin, Direct 0.0 - 0.3 mg/dL - 0.1 0.08    Lab Results  Component Value Date/Time   TSH 0.80  09/08/2013 08:41 AM   TSH 1.53 10/01/2010 08:03 AM    CBC Latest Ref Rng & Units 02/07/2020 02/26/2013 04/20/2010  WBC 4.0 - 10.5 K/uL 6.6 5.8 5.7  Hemoglobin 13.0 - 17.0 g/dL 14.5 14.2 14.4  Hematocrit 39.0 - 52.0 % 44.0 44.0 42.4  Platelets 150.0 - 400.0 K/uL 271.0 252.0 255.0    No results found for: VD25OH  Clinical ASCVD: No  The 10-year ASCVD risk score Mikey Bussing DC Jr., et al., 2013) is: 35.9%   Values used to calculate the score:     Age: 71 years     Sex: Male     Is Non-Hispanic African American: Yes     Diabetic: Yes     Tobacco smoker: No     Systolic Blood Pressure: 768 mmHg     Is BP treated: No     HDL Cholesterol: 37.3 mg/dL      Total Cholesterol: 141 mg/dL    Depression screen [facility] 2/9 04/11/2020 04/21/2019 04/29/2017  Decreased Interest 0 0 0  Down, Depressed, Hopeless 0 0 0  PHQ - 2 Score 0 0 0      Social History   Tobacco Use  Smoking Status Former   Packs/day: 2.00   Years: 10.00   Pack years: 20.00   Types: Cigarettes   Quit date: 04/19/1977   Years since quitting: 43.2  Smokeless Tobacco Never  Tobacco Comments   discussed AAA but think he has had this   BP Readings from Last 3 Encounters:  02/07/20 130/78  02/07/20 138/82  10/11/19 138/78   Pulse Readings from Last 3 Encounters:  02/07/20 83  02/07/20 68  10/11/19 74   Wt Readings from Last 3 Encounters:  02/07/20 220 lb (99.8 kg)  02/07/20 219 lb (99.3 kg)  10/11/19 224 lb (101.6 kg)   BMI Readings from Last 3 Encounters:  02/07/20 28.25 kg/m  02/07/20 28.12 kg/m  10/11/19 28.76 kg/m    Assessment/Interventions: Review of patient past medical history, allergies, medications, health status, including review of consultants reports, laboratory and other test data, was performed as part of comprehensive evaluation and provision of chronic care management services.   SDOH:  (Social Determinants of Health) assessments and interventions performed: No  SDOH Screenings   Alcohol Screen: Not on file  Depression (PHQ2-9): Low Risk    PHQ-2 Score: 0  Financial Resource Strain: Low Risk    Difficulty of Paying Living Expenses: Not hard at all  Food Insecurity: No Food Insecurity   Worried About Charity fundraiser in the Last Year: Never true   Ran Out of Food in the Last Year: Never true  Housing: Low Risk    Last Housing Risk Score: 0  Physical Activity: Insufficiently Active   Days of Exercise per Week: 2 days   Minutes of Exercise per Session: 60 min  Social Connections: Engineer, building services of Communication with Friends and Family: More than three times a week   Frequency of Social Gatherings with Friends and Family:  More than three times a week   Attends Religious Services: More than 4 times per year   Active Member of [employer] or Organizations: Yes   Attends Archivist Meetings: Never   Marital Status: Married  Stress: No Stress Concern Present   Feeling of Stress : Not at all  Tobacco Use: Medium Risk   Smoking Tobacco Use: Former   Smokeless Tobacco Use: Never  Transportation Needs: No Transportation Needs   Lack  of Transportation (Medical): No   Lack of Transportation (Non-Medical): No    CCM Care Plan  Allergies  Allergen Reactions   Jardiance [Empagliflozin]     Yeast infections   Diphenhydramine-Phenylephrine Other (See Comments)   Lisinopril Swelling   Pseudoephedrine Hcl Other (See Comments)   Sudafed Pe Sinus Cong Day-Nght  [Diphenhydramine-Phenylephrine] Other (See Comments)   Triprolidine-Pse Rash   Actifed Cold-Sinus     rash   Pseudoephedrine     REACTION: hives    Medications Reviewed Today     Reviewed by Willette Brace, LPN (Licensed Practical Nurse) on 04/11/20 at [place]  Med List Status: <None>   Medication Order Taking? Sig Documenting Provider Last Dose Status Informant  Coenzyme Q10 (CO Q 10 PO) [id number] Yes Take 200 mg by mouth daily. [provider] Taking Active   fenofibrate 160 MG tablet [id number] Yes TAKE 1 TABLET BY MOUTH  DAILY Burchette, Alinda Sierras, MD Taking Active   metFORMIN (GLUCOPHAGE) 1000 MG tablet [id number] Yes TAKE 1 TABLET BY MOUTH  TWICE DAILY WITH MEALS Burchette, Alinda Sierras, MD Taking Active   polycarbophil (FIBERCON) 625 MG tablet [id number] Yes Take 625 mg by mouth daily. Taking 2 Tablet [provider] Taking Active   rosuvastatin (CRESTOR) 10 MG tablet [id number] Yes TAKE 1 TABLET BY MOUTH  DAILY [name], [name], MD Taking Active   TRULICITY 5.18 FQ/4.2JI Bon[place] Aid [id number] Yes INJECT 0.75 MG INTO THE  SKIN WEEKLY Burchette, Alinda Sierras, MD Taking Active   vitamin B-12 (CYANOCOBALAMIN) 1000 MCG tablet [id number] Yes Take 1,000  mcg by mouth daily. [provider] Taking Active   VITAMIN D, ERGOCALCIFEROL, PO [id number] Yes Take 2,000 Units by mouth daily.  [provider] Taking Active             Patient Active Problem List   Diagnosis Date Noted   Colon cancer screening 02/07/2020   Abdominal bloating 02/07/2020   Constipation 02/07/2020   Diverticulosis of colon 18/34/3735   Periumbilical pain 78/97/8478   Personal history of colonic polyps 02/07/2020   PVC's (premature ventricular contractions) 02/07/2020   Mixed hyperlipidemia 08/26/2016   Cervical disc disorder with radiculopathy of cervical region 07/05/2013   Shoulder pain, bilateral 06/16/2013   Neck strain 10/03/2012   Chest discomfort 04/24/2012   HTN (hypertension) 02/13/2012   Right ankle pain 10/23/2011   Controlled type 2 diabetes mellitus without complication, without long-term current use of insulin ([place]) 08/08/2009   Dyslipidemia 08/08/2009   URINARY INCONTINENCE 08/08/2009    Immunization History  Administered Date(s) Administered   Fluad Quad(high Dose 65+) 08/31/2018, 10/11/2019   Influenza Split 10/08/2010, 10/14/2011   Influenza Whole 09/22/2009   Influenza, High Dose Seasonal PF 09/25/2015, 10/11/2016   Influenza,inj,Quad PF,6+ Mos 10/02/2012, 09/06/2013, 09/19/2014, 09/29/2017   PFIZER(Purple Top)SARS-COV-2 Vaccination 02/13/2019, 03/06/2019   Pneumococcal Conjugate-13 12/14/2013   Pneumococcal Polysaccharide-23 10/14/2011   Tdap 11/08/2007   Zoster Recombinat (Shingrix) 12/21/2019, 03/16/2020   Zoster, Live 11/08/2007    Conditions to be addressed/monitored:  Hypertension, Hyperlipidemia, Diabetes, and GERD  Care Plan : [place]  Updates made by Viona Gilmore, [place] since 07/07/2020 12:00 AM     Problem: Problem: Hypertension, Hyperlipidemia, Diabetes, and GERD      Long-Range Goal: Patient-Specific Goal   Start Date: 06/30/2020  Expected End Date: 06/30/2021  This Visit's  Progress: On track  Priority: High  Note:   Current Barriers:  Unable to independently monitor therapeutic efficacy Unable to achieve control of diabetes  Pharmacist Clinical Goal(s):  Patient will achieve adherence to monitoring guidelines and medication adherence to achieve therapeutic efficacy achieve control of diabetes as evidenced by A1c  through collaboration with PharmD and provider.   Interventions: 1:1 collaboration with [name], MD regarding development and update of comprehensive plan of care as evidenced by provider attestation and co-signature Inter-disciplinary care team collaboration (see longitudinal plan of care) Comprehensive medication review performed; medication list updated in electronic medical record  Hypertension (BP goal <140/90) -Controlled -Current treatment: No medications -Medications previously tried:  lisinopril (no longer needed)  -Current home readings: does not check at home -Current dietary habits: no change -Current exercise habits: sitting most of the day -Denies hypotensive/hypertensive symptoms -Educated on Exercise goal of 150 minutes per week; Importance of home blood pressure monitoring; -Counseled to monitor BP at home when symptomatic, document, and provide log at future appointments -Counseled on diet and exercise extensively  Hyperlipidemia: (LDL goal < 70) -Controlled -Current treatment: Rosuvastatin 10 mg 1 tablet daily Fenofibrate 160 mg 1 tablet daily -Medications previously tried: none  -Current dietary patterns: did not discuss -Current exercise habits: sedentary most of the day -Educated on Cholesterol goals;  Benefits of statin for ASCVD risk reduction; Importance of limiting foods high in cholesterol; Exercise goal of 150 minutes per week; -Counseled on diet and exercise extensively Recommended to continue current medication Consider Vascepa for additional TG lowering given DM diagnosis and ASCVD risk of  25.5%  Diabetes (A1c goal <7%) -Uncontrolled -Current medications: Trulicity 2.99 mg inject once weekly Metformin 1000 mg 1 tablet twice daily -Medications previously tried: Geneticist, molecular (cost)  -Current home glucose readings fasting glucose: does not check post prandial glucose: does not check -Denies hypoglycemic/hyperglycemic symptoms -Current meal patterns:  breakfast: did not discuss  lunch: did not discuss  dinner: did not discuss snacks: did not discuss drinks: rinking 2-3 bottles of water; denies drinking soda and sweet tea -Current exercise: not exercising enough -Educated on A1c and blood sugar goals; Exercise goal of 150 minutes per week; Benefits of routine self-monitoring of blood sugar; Carbohydrate counting and/or plate method -Counseled to check feet daily and get yearly eye exams -Counseled on diet and exercise extensively Recommended to continue current medication Collaborated with PCP to prescribe glucometer  GERD (Goal: minimize symptoms) -Controlled -Current treatment  Omeprazole 20 mg 1 tablet daily as needed -Medications previously tried: none  -Counseled on non-pharmacologic management of symptoms such as elevating the head of your bed, avoiding eating 2-3 hours before bed, avoiding triggering foods such as acidic, spicy, or fatty foods, eating smaller meals, and wearing clothes that are loose around the waist; recommended Tums PRN for breakthrough heartburn  Health Maintenance -Vaccine gaps: COVID boosters (patient reported he received them) -Current therapy:  Vitamin B12 1000 mcg 1 tablet daily Vitamin D 2000 units 1 tablet daily  CoQ10 200 mg 1 tablet daily Fibercon 625 mg 1 tablet daily  -Educated on Cost vs benefit of each product must be carefully weighed by individual consumer -Patient is satisfied with current therapy and denies issues -Recommended to continue current medication  Patient Goals/Self-Care Activities Patient will:  - take  medications as prescribed check glucose as directed, document, and provide at future appointments target a minimum of 150 minutes of moderate intensity exercise weekly  Follow Up Plan: Telephone follow up appointment with care management team member scheduled for: 6 months       Medication Assistance:  Patient does not qualify for assistance  Compliance/Adherence/Medication fill history: Care  Gaps: Tetanus, urine microalbumin, COVID vaccine booster, foot exam  Star-Rating Drugs: Metformin 1000 mg - last filled 04/18/20 for 90 ds at [facility] Rosuvastatin 10 mg - last filled 06/26/20 for 90 ds at [facility]  Patient's preferred pharmacy is:  [id number] - [place], [place] - [address] AT [address] [place] [address] Phone: [phone] Fax: [phone]  [facility]  [facility]) - [place], [place] [address] Phone: [phone] Fax: [phone]  Uses pill box? No - has own system Pt endorses 100% compliance  We discussed: Current pharmacy is preferred with insurance plan and patient is satisfied with pharmacy services Patient decided to: Continue current medication management strategy  Care Plan and Follow Up Patient Decision:  Patient agrees to Care Plan and Follow-up.  Plan: Telephone follow up appointment with care management team member scheduled for:  6 months  Jeni Salles, PharmD, [place] Pharmacist [place] at [place] [phone]

## 2020-07-05 DIAGNOSIS — M9902 Segmental and somatic dysfunction of thoracic region: Secondary | ICD-10-CM | POA: Diagnosis not present

## 2020-07-05 DIAGNOSIS — M5134 Other intervertebral disc degeneration, thoracic region: Secondary | ICD-10-CM | POA: Diagnosis not present

## 2020-07-05 DIAGNOSIS — M5136 Other intervertebral disc degeneration, lumbar region: Secondary | ICD-10-CM | POA: Diagnosis not present

## 2020-07-05 DIAGNOSIS — M9905 Segmental and somatic dysfunction of pelvic region: Secondary | ICD-10-CM | POA: Diagnosis not present

## 2020-07-05 DIAGNOSIS — M9901 Segmental and somatic dysfunction of cervical region: Secondary | ICD-10-CM | POA: Diagnosis not present

## 2020-07-07 NOTE — Patient Instructions (Signed)
Hi Jin,  It was great to speak with you again! Below is a summary of some of the topics we discussed.   Please reach out to me if you have any questions or need anything before our follow up!  Best, Maddie  Jeni Salles, PharmD, [place] at [place] [phone]   Visit Information   Goals Addressed   None    Patient Care Plan: CCM Pharmacy Care Plan     Problem Identified: Problem: Hypertension, Hyperlipidemia, Diabetes, and GERD      Long-Range Goal: Patient-Specific Goal   Start Date: 06/30/2020  Expected End Date: 06/30/2021  This Visit's Progress: On track  Priority: High  Note:   Current Barriers:  Unable to independently monitor therapeutic efficacy Unable to achieve control of diabetes   Pharmacist Clinical Goal(s):  Patient will achieve adherence to monitoring guidelines and medication adherence to achieve therapeutic efficacy achieve control of diabetes as evidenced by A1c  through collaboration with PharmD and provider.   Interventions: 1:1 collaboration with [name], MD regarding development and update of comprehensive plan of care as evidenced by provider attestation and co-signature Inter-disciplinary care team collaboration (see longitudinal plan of care) Comprehensive medication review performed; medication list updated in electronic medical record  Hypertension (BP goal <140/90) -Controlled -Current treatment: No medications -Medications previously tried:  lisinopril (no longer needed)  -Current home readings: does not check at home -Current dietary habits: no change -Current exercise habits: sitting most of the day -Denies hypotensive/hypertensive symptoms -Educated on Exercise goal of 150 minutes per week; Importance of home blood pressure monitoring; -Counseled to monitor BP at home when symptomatic, document, and provide log at future appointments -Counseled on diet and exercise  extensively  Hyperlipidemia: (LDL goal < 70) -Controlled -Current treatment: Rosuvastatin 10 mg 1 tablet daily Fenofibrate 160 mg 1 tablet daily -Medications previously tried: none  -Current dietary patterns: did not discuss -Current exercise habits: sedentary most of the day -Educated on Cholesterol goals;  Benefits of statin for ASCVD risk reduction; Importance of limiting foods high in cholesterol; Exercise goal of 150 minutes per week; -Counseled on diet and exercise extensively Recommended to continue current medication Consider Vascepa for additional TG lowering given DM diagnosis and ASCVD risk of 25.5%  Diabetes (A1c goal <7%) -Uncontrolled -Current medications: Trulicity 4.70 mg inject once weekly Metformin 1000 mg 1 tablet twice daily -Medications previously tried: Geneticist, molecular (cost)  -Current home glucose readings fasting glucose: does not check post prandial glucose: does not check -Denies hypoglycemic/hyperglycemic symptoms -Current meal patterns:  breakfast: did not discuss  lunch: did not discuss  dinner: did not discuss snacks: did not discuss drinks: rinking 2-3 bottles of water; denies drinking soda and sweet tea -Current exercise: not exercising enough -Educated on A1c and blood sugar goals; Exercise goal of 150 minutes per week; Benefits of routine self-monitoring of blood sugar; Carbohydrate counting and/or plate method -Counseled to check feet daily and get yearly eye exams -Counseled on diet and exercise extensively Recommended to continue current medication Collaborated with PCP to prescribe glucometer  GERD (Goal: minimize symptoms) -Controlled -Current treatment  Omeprazole 20 mg 1 tablet daily as needed -Medications previously tried: none  -Counseled on non-pharmacologic management of symptoms such as elevating the head of your bed, avoiding eating 2-3 hours before bed, avoiding triggering foods such as acidic, spicy, or fatty foods, eating  smaller meals, and wearing clothes that are loose around the waist; recommended Tums PRN for breakthrough heartburn  Health Maintenance -Vaccine  gaps: COVID boosters (patient reported he received them) -Current therapy:  Vitamin B12 1000 mcg 1 tablet daily Vitamin D 2000 units 1 tablet daily  CoQ10 200 mg 1 tablet daily Fibercon 625 mg 1 tablet daily  -Educated on Cost vs benefit of each product must be carefully weighed by individual consumer -Patient is satisfied with current therapy and denies issues -Recommended to continue current medication  Patient Goals/Self-Care Activities Patient will:  - take medications as prescribed check glucose as directed, document, and provide at future appointments target a minimum of 150 minutes of moderate intensity exercise weekly  Follow Up Plan: Telephone follow up appointment with care management team member scheduled for: 6 months       Patient verbalizes understanding of instructions provided today and agrees to view in [place].  Telephone follow up appointment with pharmacy team member scheduled for: 6 months  Viona Gilmore, [facility]

## 2020-07-13 DIAGNOSIS — M9902 Segmental and somatic dysfunction of thoracic region: Secondary | ICD-10-CM | POA: Diagnosis not present

## 2020-07-13 DIAGNOSIS — M5136 Other intervertebral disc degeneration, lumbar region: Secondary | ICD-10-CM | POA: Diagnosis not present

## 2020-07-13 DIAGNOSIS — M5134 Other intervertebral disc degeneration, thoracic region: Secondary | ICD-10-CM | POA: Diagnosis not present

## 2020-07-13 DIAGNOSIS — M9901 Segmental and somatic dysfunction of cervical region: Secondary | ICD-10-CM | POA: Diagnosis not present

## 2020-07-13 DIAGNOSIS — M9905 Segmental and somatic dysfunction of pelvic region: Secondary | ICD-10-CM | POA: Diagnosis not present

## 2020-07-18 ENCOUNTER — Encounter: Payer: Self-pay | Admitting: Family Medicine

## 2020-07-18 ENCOUNTER — Other Ambulatory Visit: Payer: Self-pay

## 2020-07-18 ENCOUNTER — Ambulatory Visit (INDEPENDENT_AMBULATORY_CARE_PROVIDER_SITE_OTHER): Payer: Medicare Other | Admitting: Family Medicine

## 2020-07-18 VITALS — BP 132/72 | HR 88 | Temp 98.4°F | Wt 230.8 lb

## 2020-07-18 DIAGNOSIS — E119 Type 2 diabetes mellitus without complications: Secondary | ICD-10-CM | POA: Diagnosis not present

## 2020-07-18 LAB — MICROALBUMIN / CREATININE URINE RATIO
Creatinine,U: 111.9 mg/dL
Microalb Creat Ratio: 0.6 mg/g (ref 0.0–30.0)
Microalb, Ur: <0.7 mg/dL (ref 0.0–1.9)

## 2020-07-18 LAB — POCT GLYCOSYLATED HEMOGLOBIN (HGB A1C): Hemoglobin A1C: 8.1 % — AB (ref 4.0–5.6)

## 2020-07-18 NOTE — Progress Notes (Signed)
Established Patient Office Visit  Subjective:  Patient ID: Todd [name], male    DOB: 20-May-1945  Age: 75 y.o. MRN: [id number]  CC:  Chief Complaint  Patient presents with   Follow-up    Diabetes,     HPI [facility] [name] presents for medical follow-up.  He has history of hypertension, type 2 diabetes, osteoarthritis, dyslipidemia.  He is also followed by cardiology.  Had nuclear stress test last year which was unremarkable.  Not monitoring blood sugars regularly.  Last A1c was 7.6%.  He takes metformin and also Trulicity.  No polyuria or polydipsia.  He had some chronic low back pains and is followed by chiropractor.  Back pain does limit activity somewhat.  We had previously discussed increasing his Trulicity to 1.5 mg subcutaneous once weekly.  He just got his recent prescription filled for the 0.75 mg  He is getting yearly eye exams.  He had recent lipids which were stable.  He needs urine microalbumin screen.  No ACE or ARB use.  Past Medical History:  Diagnosis Date   DIABETES MELLITUS, TYPE II 08/08/2009   Hiatal hernia    History of nuclear stress test    Myoview 02/2019: EF 54, apical inf and apical artifact, normal perfusion, low risk   HYPERLIPIDEMIA 08/08/2009   Palpitations    URINARY INCONTINENCE 08/08/2009    Past Surgical History:  Procedure Laterality Date   ACHILLES TENDON REPAIR  2002   rupture   APPENDECTOMY  1976   CARDIOVASCULAR STRESS TEST  11-02-2003   EF 57%   TONSILLECTOMY  1961   US ECHOCARDIOGRAPHY  02-10-2001   EF 60-65%    Family History  Problem Relation Age of Onset   Mental illness Mother    Diabetes Sister        type ll    Social History   Socioeconomic History   Marital status: Married    Spouse name: Not on file   Number of children: Not on file   Years of education: Not on file   Highest education level: Not on file  Occupational History   Not on file  Tobacco Use   Smoking status: Former    Packs/day: 2.00    Years: 10.00     Pack years: 20.00    Types: Cigarettes    Quit date: 04/19/1977    Years since quitting: 43.2   Smokeless tobacco: Never   Tobacco comments:    discussed AAA but think he has had this  Vaping Use   Vaping Use: Never used  Substance and Sexual Activity   Alcohol use: Yes    Comment:  glass of wine x 2 per week   Drug use: No   Sexual activity: Not on file  Other Topics Concern   Not on file  Social History Narrative   Not on file   Social Determinants of Health   Financial Resource Strain: Low Risk    Difficulty of Paying Living Expenses: Not hard at all  Food Insecurity: No Food Insecurity   Worried About Charity fundraiser in the Last Year: Never true   Arboriculturist in the Last Year: Never true  Transportation Needs: No Transportation Needs   Lack of Transportation (Medical): No   Lack of Transportation (Non-Medical): No  Physical Activity: Insufficiently Active   Days of Exercise per Week: 2 days   Minutes of Exercise per Session: 60 min  Stress: No Stress Concern Present   Feeling  of Stress : Not at all  Social Connections: Socially Integrated   Frequency of Communication with Friends and Family: More than three times a week   Frequency of Social Gatherings with Friends and Family: More than three times a week   Attends Religious Services: More than 4 times per year   Active Member of [employer] or Organizations: Yes   Attends Archivist Meetings: Never   Marital Status: Married  Human resources officer Violence: Not At Risk   Fear of Current or Ex-Partner: No   Emotionally Abused: No   Physically Abused: No   Sexually Abused: No    Outpatient Medications Prior to Visit  Medication Sig Dispense Refill   Coenzyme Q10 (CO Q 10 PO) Take 200 mg by mouth daily.     Dulaglutide (TRULICITY) 1.5 LS/9.3TD SOPN Inject 1.5 mg into the skin once a week.     fenofibrate 160 MG tablet TAKE 1 TABLET BY MOUTH  DAILY 90 tablet 3   metFORMIN (GLUCOPHAGE) 1000 MG tablet TAKE 1  TABLET BY MOUTH  TWICE DAILY WITH MEALS 180 tablet 3   polycarbophil (FIBERCON) 625 MG tablet Take 625 mg by mouth daily. Taking 2 Tablet     rosuvastatin (CRESTOR) 10 MG tablet TAKE 1 TABLET BY MOUTH  DAILY 90 tablet 3   vitamin B-12 (CYANOCOBALAMIN) 1000 MCG tablet Take 1,000 mcg by mouth daily.     VITAMIN D, ERGOCALCIFEROL, PO Take 2,000 Units by mouth daily.      TRULICITY 4.28 JG/8.1LX SOPN INJECT 0.75 MG INTO THE  SKIN WEEKLY 6 mL 3   No facility-administered medications prior to visit.    Allergies  Allergen Reactions   Jardiance [Empagliflozin]     Yeast infections   Diphenhydramine-Phenylephrine Other (See Comments)   Lisinopril Swelling   Pseudoephedrine Hcl Other (See Comments)   Sudafed Pe Sinus Cong Day-Nght  [Diphenhydramine-Phenylephrine] Other (See Comments)   Triprolidine-Pse Rash   Actifed Cold-Sinus     rash   Pseudoephedrine     REACTION: hives    ROS Review of Systems  Constitutional:  Negative for fatigue.  Eyes:  Negative for visual disturbance.  Respiratory:  Negative for cough, chest tightness and shortness of breath.   Cardiovascular:  Negative for chest pain, palpitations and leg swelling.  Endocrine: Negative for polydipsia and polyuria.  Neurological:  Negative for dizziness, syncope, weakness, light-headedness and headaches.     Objective:    Physical Exam Constitutional:      Appearance: He is well-developed.  HENT:     Right Ear: External ear normal.     Left Ear: External ear normal.  Eyes:     Pupils: Pupils are equal, round, and reactive to light.  Neck:     Thyroid: No thyromegaly.  Cardiovascular:     Rate and Rhythm: Normal rate and regular rhythm.  Pulmonary:     Effort: Pulmonary effort is normal. No respiratory distress.     Breath sounds: Normal breath sounds. No wheezing or rales.  Musculoskeletal:     Cervical back: Neck supple.     Comments: Does have some ankle edema right > left.  Skin:    Comments: Feet reveal no  skin lesions. Good distal foot pulses. Good capillary refill. No calluses. Normal sensation with monofilament testing   Neurological:     Mental Status: He is alert and oriented to person, place, and time.    BP 132/72 (BP Location: Left Arm, Patient Position: Sitting, Cuff Size: Normal)  Pulse 88   Temp 98.4 F (36.9 C) (Oral)   Wt 230 lb 12.8 oz (104.7 kg)   SpO2 96%   BMI 29.63 kg/m  Wt Readings from Last 3 Encounters:  07/18/20 230 lb 12.8 oz (104.7 kg)  02/07/20 220 lb (99.8 kg)  02/07/20 219 lb (99.3 kg)     Health Maintenance Due  Topic Date Due   TETANUS/TDAP  11/07/2017   URINE MICROALBUMIN  04/30/2018   COVID-19 Vaccine (3 - Pfizer risk series) 04/03/2019    There are no preventive care reminders to display for this patient.  Lab Results  Component Value Date   TSH 0.80 09/08/2013   Lab Results  Component Value Date   WBC 6.6 02/07/2020   HGB 14.5 02/07/2020   HCT 44.0 02/07/2020   MCV 84.0 02/07/2020   PLT 271.0 02/07/2020   Lab Results  Component Value Date   NA 139 02/07/2020   K 4.7 02/07/2020   CO2 28 02/07/2020   GLUCOSE 118 (H) 02/07/2020   BUN 19 02/07/2020   CREATININE 0.94 02/07/2020   BILITOT 0.4 02/07/2020   ALKPHOS 53 02/07/2020   AST 13 02/07/2020   ALT 23 02/07/2020   PROT 7.7 02/07/2020   ALBUMIN 4.7 02/07/2020   CALCIUM 10.3 02/07/2020   GFR 80.48 02/07/2020   Lab Results  Component Value Date   CHOL 141 02/07/2020   Lab Results  Component Value Date   HDL 37.30 (L) 02/07/2020   Lab Results  Component Value Date   LDLCALC 63 02/07/2020   Lab Results  Component Value Date   TRIG 200.0 (H) 02/07/2020   Lab Results  Component Value Date   CHOLHDL 4 02/07/2020   Lab Results  Component Value Date   HGBA1C 8.1 (A) 07/18/2020      Assessment & Plan:   Type 2 diabetes.  Suboptimally controlled with A1c today 8.1%.  Patient has had previous intolerance with Jardiance.  We discussed increasing his Trulicity 1.5  mg subcutaneous once weekly.  He did just get prescription filled for 0.75 mg.  -After increasing dosage to 1.5 mg, recommend 3 months of therapy and then recheck A1c -Check urine microalbumin screen today   No orders of the defined types were placed in this encounter.   Follow-up: Return in about 3 months (around 10/18/2020).    Carolann Littler, MD

## 2020-07-20 DIAGNOSIS — M9902 Segmental and somatic dysfunction of thoracic region: Secondary | ICD-10-CM | POA: Diagnosis not present

## 2020-07-20 DIAGNOSIS — M5134 Other intervertebral disc degeneration, thoracic region: Secondary | ICD-10-CM | POA: Diagnosis not present

## 2020-07-20 DIAGNOSIS — M9901 Segmental and somatic dysfunction of cervical region: Secondary | ICD-10-CM | POA: Diagnosis not present

## 2020-07-20 DIAGNOSIS — M9905 Segmental and somatic dysfunction of pelvic region: Secondary | ICD-10-CM | POA: Diagnosis not present

## 2020-07-20 DIAGNOSIS — M5136 Other intervertebral disc degeneration, lumbar region: Secondary | ICD-10-CM | POA: Diagnosis not present

## 2020-07-21 NOTE — Progress Notes (Signed)
Spoke with the patient. He is aware of Dr. Erick Blinks message and will notify us once he is out of the current medication. Nothing further needed.

## 2020-07-24 DIAGNOSIS — M47816 Spondylosis without myelopathy or radiculopathy, lumbar region: Secondary | ICD-10-CM | POA: Diagnosis not present

## 2020-07-26 DIAGNOSIS — M9902 Segmental and somatic dysfunction of thoracic region: Secondary | ICD-10-CM | POA: Diagnosis not present

## 2020-07-26 DIAGNOSIS — M5030 Other cervical disc degeneration, unspecified cervical region: Secondary | ICD-10-CM | POA: Diagnosis not present

## 2020-07-26 DIAGNOSIS — M5134 Other intervertebral disc degeneration, thoracic region: Secondary | ICD-10-CM | POA: Diagnosis not present

## 2020-07-26 DIAGNOSIS — M9901 Segmental and somatic dysfunction of cervical region: Secondary | ICD-10-CM | POA: Diagnosis not present

## 2020-07-26 DIAGNOSIS — M9905 Segmental and somatic dysfunction of pelvic region: Secondary | ICD-10-CM | POA: Diagnosis not present

## 2020-07-26 DIAGNOSIS — M5136 Other intervertebral disc degeneration, lumbar region: Secondary | ICD-10-CM | POA: Diagnosis not present

## 2020-08-03 DIAGNOSIS — M9901 Segmental and somatic dysfunction of cervical region: Secondary | ICD-10-CM | POA: Diagnosis not present

## 2020-08-03 DIAGNOSIS — M5030 Other cervical disc degeneration, unspecified cervical region: Secondary | ICD-10-CM | POA: Diagnosis not present

## 2020-08-03 DIAGNOSIS — M5136 Other intervertebral disc degeneration, lumbar region: Secondary | ICD-10-CM | POA: Diagnosis not present

## 2020-08-03 DIAGNOSIS — M9905 Segmental and somatic dysfunction of pelvic region: Secondary | ICD-10-CM | POA: Diagnosis not present

## 2020-08-03 DIAGNOSIS — M9902 Segmental and somatic dysfunction of thoracic region: Secondary | ICD-10-CM | POA: Diagnosis not present

## 2020-08-03 DIAGNOSIS — M5134 Other intervertebral disc degeneration, thoracic region: Secondary | ICD-10-CM | POA: Diagnosis not present

## 2020-08-09 DIAGNOSIS — M9905 Segmental and somatic dysfunction of pelvic region: Secondary | ICD-10-CM | POA: Diagnosis not present

## 2020-08-09 DIAGNOSIS — M9902 Segmental and somatic dysfunction of thoracic region: Secondary | ICD-10-CM | POA: Diagnosis not present

## 2020-08-09 DIAGNOSIS — M5134 Other intervertebral disc degeneration, thoracic region: Secondary | ICD-10-CM | POA: Diagnosis not present

## 2020-08-09 DIAGNOSIS — M9901 Segmental and somatic dysfunction of cervical region: Secondary | ICD-10-CM | POA: Diagnosis not present

## 2020-08-09 DIAGNOSIS — M5030 Other cervical disc degeneration, unspecified cervical region: Secondary | ICD-10-CM | POA: Diagnosis not present

## 2020-08-09 DIAGNOSIS — M5136 Other intervertebral disc degeneration, lumbar region: Secondary | ICD-10-CM | POA: Diagnosis not present

## 2020-08-22 DIAGNOSIS — M5416 Radiculopathy, lumbar region: Secondary | ICD-10-CM | POA: Diagnosis not present

## 2020-08-22 DIAGNOSIS — M5136 Other intervertebral disc degeneration, lumbar region: Secondary | ICD-10-CM | POA: Diagnosis not present

## 2020-08-26 ENCOUNTER — Encounter: Payer: Self-pay | Admitting: Family Medicine

## 2020-08-28 MED ORDER — TRULICITY 1.5 MG/0.5ML ~~LOC~~ SOAJ: 1.5000 mg | SUBCUTANEOUS | 2 refills | Status: DC

## 2020-09-05 DIAGNOSIS — M5416 Radiculopathy, lumbar region: Secondary | ICD-10-CM | POA: Diagnosis not present

## 2020-09-08 ENCOUNTER — Other Ambulatory Visit: Payer: Self-pay | Admitting: Urology

## 2020-09-08 DIAGNOSIS — M5030 Other cervical disc degeneration, unspecified cervical region: Secondary | ICD-10-CM | POA: Diagnosis not present

## 2020-09-08 DIAGNOSIS — M9901 Segmental and somatic dysfunction of cervical region: Secondary | ICD-10-CM | POA: Diagnosis not present

## 2020-09-08 DIAGNOSIS — M9905 Segmental and somatic dysfunction of pelvic region: Secondary | ICD-10-CM | POA: Diagnosis not present

## 2020-09-16 DIAGNOSIS — M5030 Other cervical disc degeneration, unspecified cervical region: Secondary | ICD-10-CM | POA: Diagnosis not present

## 2020-09-16 DIAGNOSIS — M9901 Segmental and somatic dysfunction of cervical region: Secondary | ICD-10-CM | POA: Diagnosis not present

## 2020-09-16 DIAGNOSIS — M9905 Segmental and somatic dysfunction of pelvic region: Secondary | ICD-10-CM | POA: Diagnosis not present

## 2020-09-19 DIAGNOSIS — M5416 Radiculopathy, lumbar region: Secondary | ICD-10-CM | POA: Diagnosis not present

## 2020-09-19 DIAGNOSIS — M5136 Other intervertebral disc degeneration, lumbar region: Secondary | ICD-10-CM | POA: Diagnosis not present

## 2020-09-19 DIAGNOSIS — M47816 Spondylosis without myelopathy or radiculopathy, lumbar region: Secondary | ICD-10-CM | POA: Diagnosis not present

## 2020-09-19 DIAGNOSIS — I10 Essential (primary) hypertension: Secondary | ICD-10-CM | POA: Diagnosis not present

## 2020-09-23 DIAGNOSIS — M9901 Segmental and somatic dysfunction of cervical region: Secondary | ICD-10-CM | POA: Diagnosis not present

## 2020-09-23 DIAGNOSIS — M5030 Other cervical disc degeneration, unspecified cervical region: Secondary | ICD-10-CM | POA: Diagnosis not present

## 2020-09-23 DIAGNOSIS — M9905 Segmental and somatic dysfunction of pelvic region: Secondary | ICD-10-CM | POA: Diagnosis not present

## 2020-09-25 ENCOUNTER — Telehealth: Payer: Self-pay | Admitting: Pharmacist

## 2020-09-25 NOTE — Chronic Care Management (AMB) (Signed)
Chronic Care Management Pharmacy Assistant   Name: Todd [name]  MRN: [id number] DOB: 03-13-1945  Reason for Encounter: Disease State Diabetes Assessment Call    Conditions to be addressed/monitored: HTN  Recent office visits:  07-18-2020 Todd Post, MD - Patient presented for Controlled type 2 diabetes mellitus without complication, without long-term current use of insulin. Changed Duaglutide to 1.5 mg weekly.   Recent consult visits:  None  Hospital visits:  Medication Reconciliation was completed by comparing discharge summary, patient's EMR and Pharmacy list, and upon discussion with patient.  Patient presented to [facility] ED on  03-28-20 due to Chest pain. Discharge date was 03-28-2020.   New?Medications Started at [facility] Discharge:?? -started None  Medication Changes at Hospital Discharge: -Changed None   Medications Discontinued at Hospital Discharge: -Stopped None  Medications that remain the same after Hospital Discharge:??  -All other medications will remain the same.    Medications: Outpatient Encounter Medications as of 09/25/2020  Medication Sig   Coenzyme Q10 (CO Q 10 PO) Take 200 mg by mouth daily.   Dulaglutide (TRULICITY) 1.5 0000000 SOPN Inject 1.5 mg into the skin once a week.   fenofibrate 160 MG tablet TAKE 1 TABLET BY MOUTH  DAILY   metFORMIN (GLUCOPHAGE) 1000 MG tablet TAKE 1 TABLET BY MOUTH  TWICE DAILY WITH MEALS   polycarbophil (FIBERCON) 625 MG tablet Take 625 mg by mouth daily. Taking 2 Tablet   rosuvastatin (CRESTOR) 10 MG tablet TAKE 1 TABLET BY MOUTH  DAILY   vitamin B-12 (CYANOCOBALAMIN) 1000 MCG tablet Take 1,000 mcg by mouth daily.   VITAMIN D, ERGOCALCIFEROL, PO Take 2,000 Units by mouth daily.    No facility-administered encounter medications on file as of 09/25/2020.   Recent Relevant Labs: Lab Results  Component Value Date/Time   HGBA1C 8.1 (A) 07/18/2020 01:50 PM   HGBA1C 7.6 (A) 02/07/2020 11:10 AM   HGBA1C  7.4 (A) 08/31/2018 04:03 PM   HGBA1C 9.4 (H) 06/11/2018 10:55 AM   HGBA1C 8.5 (H) 04/29/2017 02:32 PM   MICROALBUR <0.7 07/18/2020 02:35 PM   MICROALBUR <0.7 04/29/2017 02:32 PM    Kidney Function Lab Results  Component Value Date/Time   CREATININE 0.94 02/07/2020 11:35 AM   CREATININE 1.01 06/11/2018 10:52 AM   CREATININE 1.06 12/12/2015 10:15 AM   GFR 80.48 02/07/2020 11:35 AM   GFRNONAA 73 01/14/2018 11:46 AM   GFRAA 84 01/14/2018 11:46 AM    Current antihyperglycemic regimen:  Trulicity 1.5 inject once weekly Metformin 1000 mg 1 tablet twice daily What recent interventions/DTPs have been made to improve glycemic control:  Patient reports when he last saw Dr Todd [name] he advised he increase his Trulicity dosage other than that no changes.  Have there been any recent hospitalizations or ED visits since last visit with CPP? None  Patient denies hypoglycemic symptoms, including Pale, Sweaty, Shaky, Hungry, Nervous/irritable, and Vision changes Patient denies hyperglycemic symptoms, including blurry vision, excessive thirst, fatigue, polyuria, and weakness How often are you checking your blood sugar? Patient reports he is not checking at home During the week, how often does your blood glucose drop below 70? Never Are you checking your feet daily/regularly? Patient reports his feet are fine.  Adherence Review: Is the patient currently on a STATIN medication? Yes Is the patient currently on ACE/ARB medication? No Does the patient have >5 day gap between last estimated fill dates? No    Care Gaps: TDAP - Overdue COVID Booster #3 Therapist, music) - Overdue Flu  Vaccine - Overdue  Star Rating Drugs: Rosuvastatin (Crestor) 10 mg - Last filled 06-26-2020 90 DS at [facility] Metformin (Glucophage) 1000 mg - Last filled 07-04-2020 90 DS at [facility]  Dulagutide (Trulicity) 1.5 mg - Last filled 08-28-2020 84 DS at [facility] [phone]            Time  spent 23 min

## 2020-09-29 DIAGNOSIS — M5136 Other intervertebral disc degeneration, lumbar region: Secondary | ICD-10-CM | POA: Diagnosis not present

## 2020-09-29 DIAGNOSIS — M5134 Other intervertebral disc degeneration, thoracic region: Secondary | ICD-10-CM | POA: Diagnosis not present

## 2020-09-29 DIAGNOSIS — M9905 Segmental and somatic dysfunction of pelvic region: Secondary | ICD-10-CM | POA: Diagnosis not present

## 2020-09-29 DIAGNOSIS — M9902 Segmental and somatic dysfunction of thoracic region: Secondary | ICD-10-CM | POA: Diagnosis not present

## 2020-09-29 DIAGNOSIS — M9901 Segmental and somatic dysfunction of cervical region: Secondary | ICD-10-CM | POA: Diagnosis not present

## 2020-09-29 DIAGNOSIS — M5030 Other cervical disc degeneration, unspecified cervical region: Secondary | ICD-10-CM | POA: Diagnosis not present

## 2020-10-06 DIAGNOSIS — M9905 Segmental and somatic dysfunction of pelvic region: Secondary | ICD-10-CM | POA: Diagnosis not present

## 2020-10-06 DIAGNOSIS — M9901 Segmental and somatic dysfunction of cervical region: Secondary | ICD-10-CM | POA: Diagnosis not present

## 2020-10-06 DIAGNOSIS — M5136 Other intervertebral disc degeneration, lumbar region: Secondary | ICD-10-CM | POA: Diagnosis not present

## 2020-10-06 DIAGNOSIS — M5134 Other intervertebral disc degeneration, thoracic region: Secondary | ICD-10-CM | POA: Diagnosis not present

## 2020-10-06 DIAGNOSIS — M9902 Segmental and somatic dysfunction of thoracic region: Secondary | ICD-10-CM | POA: Diagnosis not present

## 2020-10-07 DIAGNOSIS — M5030 Other cervical disc degeneration, unspecified cervical region: Secondary | ICD-10-CM | POA: Diagnosis not present

## 2020-10-07 DIAGNOSIS — M9901 Segmental and somatic dysfunction of cervical region: Secondary | ICD-10-CM | POA: Diagnosis not present

## 2020-10-07 DIAGNOSIS — M9905 Segmental and somatic dysfunction of pelvic region: Secondary | ICD-10-CM | POA: Diagnosis not present

## 2020-10-09 DIAGNOSIS — M9901 Segmental and somatic dysfunction of cervical region: Secondary | ICD-10-CM | POA: Diagnosis not present

## 2020-10-09 DIAGNOSIS — M5136 Other intervertebral disc degeneration, lumbar region: Secondary | ICD-10-CM | POA: Diagnosis not present

## 2020-10-09 DIAGNOSIS — M9902 Segmental and somatic dysfunction of thoracic region: Secondary | ICD-10-CM | POA: Diagnosis not present

## 2020-10-09 DIAGNOSIS — M5030 Other cervical disc degeneration, unspecified cervical region: Secondary | ICD-10-CM | POA: Diagnosis not present

## 2020-10-09 DIAGNOSIS — M9905 Segmental and somatic dysfunction of pelvic region: Secondary | ICD-10-CM | POA: Diagnosis not present

## 2020-10-09 DIAGNOSIS — M5134 Other intervertebral disc degeneration, thoracic region: Secondary | ICD-10-CM | POA: Diagnosis not present

## 2020-10-10 DIAGNOSIS — M9905 Segmental and somatic dysfunction of pelvic region: Secondary | ICD-10-CM | POA: Diagnosis not present

## 2020-10-10 DIAGNOSIS — M9901 Segmental and somatic dysfunction of cervical region: Secondary | ICD-10-CM | POA: Diagnosis not present

## 2020-10-14 DIAGNOSIS — M9905 Segmental and somatic dysfunction of pelvic region: Secondary | ICD-10-CM | POA: Diagnosis not present

## 2020-10-14 DIAGNOSIS — M5030 Other cervical disc degeneration, unspecified cervical region: Secondary | ICD-10-CM | POA: Diagnosis not present

## 2020-10-14 DIAGNOSIS — M9901 Segmental and somatic dysfunction of cervical region: Secondary | ICD-10-CM | POA: Diagnosis not present

## 2020-10-19 DIAGNOSIS — M5127 Other intervertebral disc displacement, lumbosacral region: Secondary | ICD-10-CM | POA: Diagnosis not present

## 2020-10-20 DIAGNOSIS — M9901 Segmental and somatic dysfunction of cervical region: Secondary | ICD-10-CM | POA: Diagnosis not present

## 2020-10-20 DIAGNOSIS — M9905 Segmental and somatic dysfunction of pelvic region: Secondary | ICD-10-CM | POA: Diagnosis not present

## 2020-10-25 ENCOUNTER — Other Ambulatory Visit: Payer: Self-pay | Admitting: Family Medicine

## 2020-10-27 DIAGNOSIS — M9901 Segmental and somatic dysfunction of cervical region: Secondary | ICD-10-CM | POA: Diagnosis not present

## 2020-10-27 DIAGNOSIS — M5136 Other intervertebral disc degeneration, lumbar region: Secondary | ICD-10-CM | POA: Diagnosis not present

## 2020-10-27 DIAGNOSIS — M9902 Segmental and somatic dysfunction of thoracic region: Secondary | ICD-10-CM | POA: Diagnosis not present

## 2020-10-27 DIAGNOSIS — M5030 Other cervical disc degeneration, unspecified cervical region: Secondary | ICD-10-CM | POA: Diagnosis not present

## 2020-10-27 DIAGNOSIS — M5134 Other intervertebral disc degeneration, thoracic region: Secondary | ICD-10-CM | POA: Diagnosis not present

## 2020-10-27 DIAGNOSIS — M9905 Segmental and somatic dysfunction of pelvic region: Secondary | ICD-10-CM | POA: Diagnosis not present

## 2020-11-01 DIAGNOSIS — M5416 Radiculopathy, lumbar region: Secondary | ICD-10-CM | POA: Diagnosis not present

## 2020-11-01 DIAGNOSIS — R030 Elevated blood-pressure reading, without diagnosis of hypertension: Secondary | ICD-10-CM | POA: Diagnosis not present

## 2020-11-03 DIAGNOSIS — M5030 Other cervical disc degeneration, unspecified cervical region: Secondary | ICD-10-CM | POA: Diagnosis not present

## 2020-11-03 DIAGNOSIS — M9901 Segmental and somatic dysfunction of cervical region: Secondary | ICD-10-CM | POA: Diagnosis not present

## 2020-11-03 DIAGNOSIS — M5134 Other intervertebral disc degeneration, thoracic region: Secondary | ICD-10-CM | POA: Diagnosis not present

## 2020-11-03 DIAGNOSIS — M5136 Other intervertebral disc degeneration, lumbar region: Secondary | ICD-10-CM | POA: Diagnosis not present

## 2020-11-03 DIAGNOSIS — M9905 Segmental and somatic dysfunction of pelvic region: Secondary | ICD-10-CM | POA: Diagnosis not present

## 2020-11-03 DIAGNOSIS — M9902 Segmental and somatic dysfunction of thoracic region: Secondary | ICD-10-CM | POA: Diagnosis not present

## 2020-11-04 DIAGNOSIS — R059 Cough, unspecified: Secondary | ICD-10-CM | POA: Diagnosis not present

## 2020-11-04 DIAGNOSIS — H6123 Impacted cerumen, bilateral: Secondary | ICD-10-CM | POA: Diagnosis not present

## 2020-11-04 DIAGNOSIS — J069 Acute upper respiratory infection, unspecified: Secondary | ICD-10-CM | POA: Diagnosis not present

## 2020-11-11 DIAGNOSIS — M5030 Other cervical disc degeneration, unspecified cervical region: Secondary | ICD-10-CM | POA: Diagnosis not present

## 2020-11-11 DIAGNOSIS — M9905 Segmental and somatic dysfunction of pelvic region: Secondary | ICD-10-CM | POA: Diagnosis not present

## 2020-11-11 DIAGNOSIS — M9901 Segmental and somatic dysfunction of cervical region: Secondary | ICD-10-CM | POA: Diagnosis not present

## 2020-11-18 DIAGNOSIS — M9905 Segmental and somatic dysfunction of pelvic region: Secondary | ICD-10-CM | POA: Diagnosis not present

## 2020-11-18 DIAGNOSIS — M9901 Segmental and somatic dysfunction of cervical region: Secondary | ICD-10-CM | POA: Diagnosis not present

## 2020-11-23 DIAGNOSIS — M5416 Radiculopathy, lumbar region: Secondary | ICD-10-CM | POA: Diagnosis not present

## 2020-12-06 ENCOUNTER — Encounter: Payer: Self-pay | Admitting: Family Medicine

## 2020-12-06 ENCOUNTER — Ambulatory Visit (INDEPENDENT_AMBULATORY_CARE_PROVIDER_SITE_OTHER): Payer: Medicare Other | Admitting: Family Medicine

## 2020-12-06 VITALS — BP 146/78 | HR 87 | Temp 98.2°F | Resp 18 | Wt 226.0 lb

## 2020-12-06 DIAGNOSIS — E119 Type 2 diabetes mellitus without complications: Secondary | ICD-10-CM

## 2020-12-06 DIAGNOSIS — I10 Essential (primary) hypertension: Secondary | ICD-10-CM

## 2020-12-06 LAB — POCT GLYCOSYLATED HEMOGLOBIN (HGB A1C): Hemoglobin A1C: 8.4 % — AB (ref 4.0–5.6)

## 2020-12-06 MED ORDER — OZEMPIC (0.25 OR 0.5 MG/DOSE) 2 MG/1.5ML ~~LOC~~ SOPN: PEN_INJECTOR | SUBCUTANEOUS | 3 refills | Status: DC

## 2020-12-06 NOTE — Progress Notes (Signed)
Established Patient Office Visit  Subjective:  Patient ID: Todd [name], male    DOB: 16-Apr-1945  Age: 75 y.o. MRN: [id number]  CC:  Chief Complaint  Patient presents with   Annual Exam    Pt is not fasting, would like A1C check     HPI [facility] [name] presents for diabetic follow-up.  He has chronic problems include history of PVCs, hypertension, type 2 diabetes, dyslipidemia.  Suboptimally controlled diabetes.  Last A1c 8.1%.  We increased his Trulicity to 1.5 mg subcutaneous once weekly.  He also remains on metformin.  Previous intolerance with Jardiance.  He has been limited in exercise because of some chronic back difficulties.  He feels like his dietary compliance has been fairly good.  Not monitoring blood sugars regularly.  He is on lipid-lowering therapy with rosuvastatin and fenofibrate.  Blood pressure slightly up today.  Denies any regular nonsteroidal use.  No regular alcohol use.  Past Medical History:  Diagnosis Date   DIABETES MELLITUS, TYPE II 08/08/2009   Hiatal hernia    History of nuclear stress test    Myoview 02/2019: EF 54, apical inf and apical artifact, normal perfusion, low risk   HYPERLIPIDEMIA 08/08/2009   Palpitations    URINARY INCONTINENCE 08/08/2009    Past Surgical History:  Procedure Laterality Date   ACHILLES TENDON REPAIR  2002   rupture   APPENDECTOMY  1976   CARDIOVASCULAR STRESS TEST  11-02-2003   EF 57%   TONSILLECTOMY  1961   US ECHOCARDIOGRAPHY  02-10-2001   EF 60-65%    Family History  Problem Relation Age of Onset   Mental illness Mother    Diabetes Sister        type ll    Social History   Socioeconomic History   Marital status: Married    Spouse name: Not on file   Number of children: Not on file   Years of education: Not on file   Highest education level: Bachelor's degree (e.g., BA, AB, BS)  Occupational History   Not on file  Tobacco Use   Smoking status: Former    Packs/day: 2.00    Years: 10.00    Pack years:  20.00    Types: Cigarettes    Quit date: 04/19/1977    Years since quitting: 43.6   Smokeless tobacco: Never   Tobacco comments:    discussed AAA but think he has had this  Vaping Use   Vaping Use: Never used  Substance and Sexual Activity   Alcohol use: Yes    Comment:  glass of wine x 2 per week   Drug use: No   Sexual activity: Not on file  Other Topics Concern   Not on file  Social History Narrative   Not on file   Social Determinants of Health   Financial Resource Strain: Low Risk    Difficulty of Paying Living Expenses: Not hard at all  Food Insecurity: No Food Insecurity   Worried About Charity fundraiser in the Last Year: Never true   [place] in the Last Year: Never true  Transportation Needs: No Transportation Needs   Lack of Transportation (Medical): No   Lack of Transportation (Non-Medical): No  Physical Activity: Insufficiently Active   Days of Exercise per Week: 2 days   Minutes of Exercise per Session: 30 min  Stress: No Stress Concern Present   Feeling of Stress : Not at all  Social Connections: Socially Integrated  Frequency of Communication with Friends and Family: More than three times a week   Frequency of Social Gatherings with Friends and Family: Twice a week   Attends Religious Services: More than 4 times per year   Active Member of [employer] or Organizations: Yes   Attends Music therapist: More than 4 times per year   Marital Status: Married  Human resources officer Violence: Not At Risk   Fear of Current or Ex-Partner: No   Emotionally Abused: No   Physically Abused: No   Sexually Abused: No    Outpatient Medications Prior to Visit  Medication Sig Dispense Refill   Coenzyme Q10 (CO Q 10 PO) Take 200 mg by mouth daily.     fenofibrate 160 MG tablet TAKE 1 TABLET BY MOUTH  DAILY 90 tablet 3   metFORMIN (GLUCOPHAGE) 1000 MG tablet TAKE 1 TABLET BY MOUTH  TWICE DAILY WITH MEALS 180 tablet 3   polycarbophil (FIBERCON) 625 MG tablet  Take 625 mg by mouth daily. Taking 2 Tablet     rosuvastatin (CRESTOR) 10 MG tablet TAKE 1 TABLET BY MOUTH  DAILY 90 tablet 3   TRULICITY 1.5 KG/4.0NU SOPN INJECT THE CONTENTS OF ONE  PEN SUBCUTANEOUSLY WEEKLY  AS DIRECTED 6 mL 3   vitamin B-12 (CYANOCOBALAMIN) 1000 MCG tablet Take 1,000 mcg by mouth daily.     VITAMIN D, ERGOCALCIFEROL, PO Take 2,000 Units by mouth daily.      No facility-administered medications prior to visit.    Allergies  Allergen Reactions   Jardiance [Empagliflozin]     Yeast infections   Diphenhydramine-Phenylephrine Other (See Comments)   Lisinopril Swelling   Pseudoephedrine Hcl Other (See Comments)   Sudafed Pe Sinus Cong Day-Nght  [Diphenhydramine-Phenylephrine] Other (See Comments)   Triprolidine-Pse Rash   Actifed Cold-Sinus     rash   Pseudoephedrine     REACTION: hives    ROS Review of Systems  Constitutional:  Negative for fatigue.  Eyes:  Negative for visual disturbance.  Respiratory:  Negative for cough, chest tightness and shortness of breath.   Cardiovascular:  Negative for chest pain, palpitations and leg swelling.  Endocrine: Negative for polydipsia.  Neurological:  Negative for dizziness, syncope, weakness, light-headedness and headaches.     Objective:    Physical Exam Vitals reviewed.  Constitutional:      Appearance: Normal appearance.  Cardiovascular:     Rate and Rhythm: Normal rate and regular rhythm.  Pulmonary:     Effort: Pulmonary effort is normal.     Breath sounds: Normal breath sounds.  Musculoskeletal:     Right lower leg: No edema.     Left lower leg: No edema.  Neurological:     Mental Status: He is alert.    BP (!) 142/80 (BP Location: Left Arm, Patient Position: Sitting, Cuff Size: Large)   Pulse 87   Temp 98.2 F (36.8 C) (Oral)   Resp 18   Wt 226 lb (102.5 kg)   SpO2 98%   BMI 29.02 kg/m  Wt Readings from Last 3 Encounters:  12/06/20 226 lb (102.5 kg)  07/18/20 230 lb 12.8 oz (104.7 kg)   02/07/20 220 lb (99.8 kg)     Health Maintenance Due  Topic Date Due   TETANUS/TDAP  11/07/2017   COVID-19 Vaccine (3 - Pfizer risk series) 04/03/2019   INFLUENZA VACCINE  08/07/2020   COLONOSCOPY (Pts 45-98yrs Insurance coverage will need to be confirmed)  10/09/2020    There are no preventive care  reminders to display for this patient.  Lab Results  Component Value Date   TSH 0.80 09/08/2013   Lab Results  Component Value Date   WBC 6.6 02/07/2020   HGB 14.5 02/07/2020   HCT 44.0 02/07/2020   MCV 84.0 02/07/2020   PLT 271.0 02/07/2020   Lab Results  Component Value Date   NA 139 02/07/2020   K 4.7 02/07/2020   CO2 28 02/07/2020   GLUCOSE 118 (H) 02/07/2020   BUN 19 02/07/2020   CREATININE 0.94 02/07/2020   BILITOT 0.4 02/07/2020   ALKPHOS 53 02/07/2020   AST 13 02/07/2020   ALT 23 02/07/2020   PROT 7.7 02/07/2020   ALBUMIN 4.7 02/07/2020   CALCIUM 10.3 02/07/2020   GFR 80.48 02/07/2020   Lab Results  Component Value Date   CHOL 141 02/07/2020   Lab Results  Component Value Date   HDL 37.30 (L) 02/07/2020   Lab Results  Component Value Date   LDLCALC 63 02/07/2020   Lab Results  Component Value Date   TRIG 200.0 (H) 02/07/2020   Lab Results  Component Value Date   CHOLHDL 4 02/07/2020   Lab Results  Component Value Date   HGBA1C 8.4 (A) 12/06/2020      Assessment & Plan:   #1 type 2 diabetes suboptimally controlled.  Somewhat surprisingly his A1c went from 8.1 to 8.4% with recent increase in Trulicity.  Previous intolerance with Jardiance.  On maximum dose metformin.  Would like to avoid sulfonylurea if possible. -We did discuss possible change to Ozempic if he can get this covered in place of Trulicity.  If he can start Ozempic we will do accelerated dosing of 0.25 mg once weekly for 2 weeks and then increase to 0.5 mg.  If he cannot get the Ozempic because of insurance issues we will have him titrate Trulicity up further to 3 mg once  weekly -Set up 63-month follow-up  #2 elevated blood pressure.  This is generally been fairly well controlled the past.  He is not monitoring regularly.  We discussed possible therapy possibly with ACE or ARB given his diabetes history but he would like to give this 3 months of sodium reduction, attempted weight loss and monitoring   Meds ordered this encounter  Medications   Semaglutide,0.25 or 0.5MG /DOS, (OZEMPIC, 0.25 OR 0.5 MG/DOSE,) 2 MG/1.5ML SOPN    Sig: Start 0.25 mg [place] once weekly for 2 weeks and then increase to 0.5 mg [place] once weekly.    Dispense:  4.5 mL    Refill:  3    Follow-up: Return in about 3 months (around 03/06/2021).    Carolann Littler, MD

## 2020-12-08 ENCOUNTER — Encounter: Payer: Self-pay | Admitting: Family Medicine

## 2020-12-08 DIAGNOSIS — M9902 Segmental and somatic dysfunction of thoracic region: Secondary | ICD-10-CM | POA: Diagnosis not present

## 2020-12-08 DIAGNOSIS — M5030 Other cervical disc degeneration, unspecified cervical region: Secondary | ICD-10-CM | POA: Diagnosis not present

## 2020-12-08 DIAGNOSIS — M9901 Segmental and somatic dysfunction of cervical region: Secondary | ICD-10-CM | POA: Diagnosis not present

## 2020-12-08 DIAGNOSIS — M5134 Other intervertebral disc degeneration, thoracic region: Secondary | ICD-10-CM | POA: Diagnosis not present

## 2020-12-08 DIAGNOSIS — M5136 Other intervertebral disc degeneration, lumbar region: Secondary | ICD-10-CM | POA: Diagnosis not present

## 2020-12-08 DIAGNOSIS — M9905 Segmental and somatic dysfunction of pelvic region: Secondary | ICD-10-CM | POA: Diagnosis not present

## 2020-12-14 ENCOUNTER — Encounter: Payer: Self-pay | Admitting: Family Medicine

## 2020-12-14 DIAGNOSIS — M9905 Segmental and somatic dysfunction of pelvic region: Secondary | ICD-10-CM | POA: Diagnosis not present

## 2020-12-14 DIAGNOSIS — M9902 Segmental and somatic dysfunction of thoracic region: Secondary | ICD-10-CM | POA: Diagnosis not present

## 2020-12-14 DIAGNOSIS — M9901 Segmental and somatic dysfunction of cervical region: Secondary | ICD-10-CM | POA: Diagnosis not present

## 2020-12-14 DIAGNOSIS — M5136 Other intervertebral disc degeneration, lumbar region: Secondary | ICD-10-CM | POA: Diagnosis not present

## 2020-12-14 DIAGNOSIS — M5134 Other intervertebral disc degeneration, thoracic region: Secondary | ICD-10-CM | POA: Diagnosis not present

## 2020-12-20 DIAGNOSIS — M5416 Radiculopathy, lumbar region: Secondary | ICD-10-CM | POA: Diagnosis not present

## 2020-12-23 DIAGNOSIS — M9905 Segmental and somatic dysfunction of pelvic region: Secondary | ICD-10-CM | POA: Diagnosis not present

## 2020-12-23 DIAGNOSIS — M5030 Other cervical disc degeneration, unspecified cervical region: Secondary | ICD-10-CM | POA: Diagnosis not present

## 2020-12-23 DIAGNOSIS — M9901 Segmental and somatic dysfunction of cervical region: Secondary | ICD-10-CM | POA: Diagnosis not present

## 2020-12-26 ENCOUNTER — Other Ambulatory Visit: Payer: Self-pay | Admitting: Family Medicine

## 2020-12-27 ENCOUNTER — Telehealth: Payer: Self-pay | Admitting: Family Medicine

## 2020-12-27 ENCOUNTER — Encounter: Payer: Self-pay | Admitting: Family Medicine

## 2020-12-27 NOTE — Telephone Encounter (Signed)
Patient calling in with respiratory symptoms: Shortness of breath, chest pain, palpitations or other red words send to Triage  Does the patient have a fever over 100, cough, congestion, sore throat, runny nose, lost of taste/smell (please list symptoms that patient has)?cough,sore throat, headache   What date did symptoms start?aroud 12-23-2020 (If over 5 days ago, pt may be scheduled for in person visit)  Have you tested for Covid in the last 5 days? Yes   If yes, was it positive [x]  OR negative [] ? If positive in the last 5 days, please schedule virtual visit now. If negative, schedule for an in person OV with the next available provider if PCP has no openings. Please also let patient know they will be tested again (follow the script below)  "you will have to arrive 24mins prior to your appt time to be Covid tested. Please park in back of office at the [facility] & call [phone] to let the staff know you have arrived. A staff member will meet you at your car to do a rapid covid test. Once the test has resulted you will be notified by phone of your results to determine if appt will remain an in person visit or be converted to a virtual/phone visit. If you arrive less than 39mins before your appt time, your visit will be automatically converted to virtual & any recommended testing will happen AFTER the visit."  Pt has virtual with dr Elease Hashimoto on 12-29-2020. Pt was offered virtual with dr Maudie Mercury for Thursday pt decline THINGS TO REMEMBER  If no availability for virtual visit in office,  please schedule another [place] office  If no availability at another [place] office, please instruct patient that they can schedule an evisit or virtual visit through their mychart account. Visits up to 8pm  patients can be seen in office 5 days after positive COVID test

## 2020-12-29 ENCOUNTER — Telehealth (INDEPENDENT_AMBULATORY_CARE_PROVIDER_SITE_OTHER): Payer: Medicare Other | Admitting: Family Medicine

## 2020-12-29 DIAGNOSIS — U071 COVID-19: Secondary | ICD-10-CM

## 2020-12-29 NOTE — Progress Notes (Signed)
Patient ID: Abbott Pao III, male   DOB: April 09, 1945, 75 y.o.   MRN: [id number]  This visit type was conducted due to national recommendations for restrictions regarding the COVID-19 pandemic in an effort to limit this patient's exposure and mitigate transmission in our community.   Virtual Visit via Video Note  I connected with Keone Karis on 12/29/20 at 10:00 AM EST by a video enabled telemedicine application and verified that I am speaking with the correct person using two identifiers.  Location patient: home Location provider:work or home office Persons participating in the virtual visit: patient, provider  I discussed the limitations of evaluation and management by telemedicine and the availability of in person appointments. The patient expressed understanding and agreed to proceed.   HPI:  Todd [name] has COVID-19.  His wife had been traveling recently and started feeling ill last Saturday.  Patient had onset of symptoms on Monday.  He had cold-like symptoms with headache, sore throat, mild nausea, cough, low-grade fever, body aches.  On Tuesday he performed home COVID test which came back positive.  He has been fully vaccinated.  He states he feels much better at this time.  Past couple of days he has made progress with no fever and less aches and congestion.  No dyspnea.  Keeping down fluids well.  He does have increased risk because of his age, comorbidities of type 2 diabetes and hypertension.   ROS: See pertinent positives and negatives per HPI.  Past Medical History:  Diagnosis Date   DIABETES MELLITUS, TYPE II 08/08/2009   Hiatal hernia    History of nuclear stress test    Myoview 02/2019: EF 54, apical inf and apical artifact, normal perfusion, low risk   HYPERLIPIDEMIA 08/08/2009   Palpitations    URINARY INCONTINENCE 08/08/2009    Past Surgical History:  Procedure Laterality Date   ACHILLES TENDON REPAIR  2002   rupture   APPENDECTOMY  1976   CARDIOVASCULAR STRESS TEST  11-02-2003    EF 57%   TONSILLECTOMY  1961   US ECHOCARDIOGRAPHY  02-10-2001   EF 60-65%    Family History  Problem Relation Age of Onset   Mental illness Mother    Diabetes Sister        type ll    SOCIAL HX: Non-smoker   Current Outpatient Medications:    Coenzyme Q10 (CO Q 10 PO), Take 200 mg by mouth daily., Disp: , Rfl:    fenofibrate 160 MG tablet, TAKE 1 TABLET BY MOUTH  DAILY, Disp: 90 tablet, Rfl: 3   metFORMIN (GLUCOPHAGE) 1000 MG tablet, TAKE 1 TABLET BY MOUTH  TWICE DAILY WITH MEALS, Disp: 180 tablet, Rfl: 3   polycarbophil (FIBERCON) 625 MG tablet, Take 625 mg by mouth daily. Taking 2 Tablet, Disp: , Rfl:    rosuvastatin (CRESTOR) 10 MG tablet, TAKE 1 TABLET BY MOUTH  DAILY, Disp: 90 tablet, Rfl: 3   Semaglutide,0.25 or 0.5MG /DOS, (OZEMPIC, 0.25 OR 0.5 MG/DOSE,) 2 MG/1.5ML SOPN, Start 0.25 mg [place] once weekly for 2 weeks and then increase to 0.5 mg [place] once weekly., Disp: 4.5 mL, Rfl: 3   TRULICITY 1.5 YJ/8.5UD SOPN, INJECT THE CONTENTS OF ONE  PEN SUBCUTANEOUSLY WEEKLY  AS DIRECTED, Disp: 6 mL, Rfl: 3   vitamin B-12 (CYANOCOBALAMIN) 1000 MCG tablet, Take 1,000 mcg by mouth daily., Disp: , Rfl:    VITAMIN D, ERGOCALCIFEROL, PO, Take 2,000 Units by mouth daily. , Disp: , Rfl:   EXAM:  VITALS per patient if applicable:  GENERAL: alert, oriented, appears well and in no acute distress  HEENT: atraumatic, conjunttiva clear, no obvious abnormalities on inspection of external nose and ears  NECK: normal movements of the head and neck  LUNGS: on inspection no signs of respiratory distress, breathing rate appears normal, no obvious gross SOB, gasping or wheezing  CV: no obvious cyanosis  MS: moves all visible extremities without noticeable abnormality  PSYCH/NEURO: pleasant and cooperative, no obvious depression or anxiety, speech and thought processing grossly intact  ASSESSMENT AND PLAN:  Discussed the following assessment and plan:  COVID-19 -improving.  He is currently on day  5.  He has not had any respiratory distress and feels greatly improved compared to 2 days ago.  We did discuss antiviral therapy option of Molnupiravir --but given the fact that he has had significant provement past couple days and the fact this is day 5 of symptoms we elected not to treat with that and this is his preference.  Continue over-the-counter analgesics as needed and plenty fluids and rest.  We reviewed isolation guidelines     I discussed the assessment and treatment plan with the patient. The patient was provided an opportunity to ask questions and all were answered. The patient agreed with the plan and demonstrated an understanding of the instructions.   The patient was advised to call back or seek an in-person evaluation if the symptoms worsen or if the condition fails to improve as anticipated.     Carolann Littler, MD

## 2021-01-01 ENCOUNTER — Other Ambulatory Visit: Payer: Self-pay | Admitting: Cardiovascular Disease

## 2021-01-09 DIAGNOSIS — M9905 Segmental and somatic dysfunction of pelvic region: Secondary | ICD-10-CM | POA: Diagnosis not present

## 2021-01-09 DIAGNOSIS — M9901 Segmental and somatic dysfunction of cervical region: Secondary | ICD-10-CM | POA: Diagnosis not present

## 2021-01-09 DIAGNOSIS — M5030 Other cervical disc degeneration, unspecified cervical region: Secondary | ICD-10-CM | POA: Diagnosis not present

## 2021-01-13 DIAGNOSIS — M9901 Segmental and somatic dysfunction of cervical region: Secondary | ICD-10-CM | POA: Diagnosis not present

## 2021-01-13 DIAGNOSIS — M9905 Segmental and somatic dysfunction of pelvic region: Secondary | ICD-10-CM | POA: Diagnosis not present

## 2021-01-13 DIAGNOSIS — M5030 Other cervical disc degeneration, unspecified cervical region: Secondary | ICD-10-CM | POA: Diagnosis not present

## 2021-01-19 DIAGNOSIS — M9901 Segmental and somatic dysfunction of cervical region: Secondary | ICD-10-CM | POA: Diagnosis not present

## 2021-01-19 DIAGNOSIS — M9905 Segmental and somatic dysfunction of pelvic region: Secondary | ICD-10-CM | POA: Diagnosis not present

## 2021-01-22 DIAGNOSIS — K760 Fatty (change of) liver, not elsewhere classified: Secondary | ICD-10-CM | POA: Diagnosis not present

## 2021-01-22 DIAGNOSIS — R1032 Left lower quadrant pain: Secondary | ICD-10-CM | POA: Diagnosis not present

## 2021-01-22 DIAGNOSIS — K5792 Diverticulitis of intestine, part unspecified, without perforation or abscess without bleeding: Secondary | ICD-10-CM | POA: Diagnosis not present

## 2021-01-22 DIAGNOSIS — K5732 Diverticulitis of large intestine without perforation or abscess without bleeding: Secondary | ICD-10-CM | POA: Diagnosis not present

## 2021-01-22 DIAGNOSIS — R109 Unspecified abdominal pain: Secondary | ICD-10-CM | POA: Diagnosis not present

## 2021-01-25 DIAGNOSIS — Z7984 Long term (current) use of oral hypoglycemic drugs: Secondary | ICD-10-CM | POA: Diagnosis not present

## 2021-01-25 DIAGNOSIS — E119 Type 2 diabetes mellitus without complications: Secondary | ICD-10-CM | POA: Diagnosis not present

## 2021-01-25 DIAGNOSIS — H2513 Age-related nuclear cataract, bilateral: Secondary | ICD-10-CM | POA: Diagnosis not present

## 2021-01-25 DIAGNOSIS — H35373 Puckering of macula, bilateral: Secondary | ICD-10-CM | POA: Diagnosis not present

## 2021-01-25 LAB — HM DIABETES EYE EXAM

## 2021-01-26 ENCOUNTER — Encounter: Payer: Self-pay | Admitting: Family Medicine

## 2021-02-01 ENCOUNTER — Encounter: Payer: Self-pay | Admitting: Cardiovascular Disease

## 2021-02-01 NOTE — Progress Notes (Signed)
Cardiology Office Note   Date:  02/02/2021   ID:  Todd [name], Todd [name] 05/05/1945, MRN [id number]  PCP:  [name], MD  Cardiologist:   Mertie Moores, MD   Chief Complaint  Patient presents with   Hypertension        Hyperlipidemia   1. Hypercholesterolemia 2. Palpitations 3. History of chest pain- normal stress Myoview study in October, 2005 4. Diabetes mellitus 5. Hypertension 6. Atypical chest pain:    Todd [name] is a 76 year old gentleman with a history as noted above.   He is remaining quite active. He is fairly healthy. He's been exercising on a fairly regular basis.  He denies episodes of chest pain or shortness of breath.  Feb. 6, 2014: Todd [name] is doing well.  He's not had any episodes of chest pain or shortness breath. He still exercising some but not as much as he would like to.  He gained a little bit of weight on a cruise this past fall and still working on getting that weight off.  April, 18, 2014:  He feels ok but has had an unusual sensation across his chest.   It is a similar sensation that he gets in his arms with the pravachol.  He has decreased his dose and feels better. The sensation is not related to exercise, taking a deep breath, eating or drinking. It is also not related to twisting or turning of his torso..  the sensation is an off and on sensation. to last for a couple of hours.  Nov. 3, 2014:  Todd [name] is doing well.  Exercising sporadically .    Sept. 3, 2015:  Todd [name] is doing ok.  Not exercising as much. 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    Has gained some weight - lack of exercise,  Has not changed his diet.        May 04, 2014:   Todd [name] is a 76 y.o. male who presents for follow up of his HTN and hyperlipidemia.   He's been having some episodes of CP recently.  Worse with deep breath. More fatigued for the past couple of weeks  .  Sleeping well.  Wants to get back into an exercise  regimin.   Nov. 8, 2016:  Doing well.  Fairly active.  Walks on occasion.    Dec. 5, 2017:  Todd [name] is doing well. Had a CT of the abdomen that mentioned possible coronary artery calcifications.  Had a low risk myoview in July, 2017. Lipids in Sept. 2016.  are ok.   Trigs = 217, LDL was 94 Has a physical coming up in a week or so   No CP.   Still very active .   Limited by his back pain .   Aug. 20, 2018:  Todd [name] is seen back today  For further evaluation of his hyperlipidemia and coronary calcifications. He had a Myoview study in July, 2017 which was low risk.  Has gotten married since I last saw him .  No CP or dyspnea. Not as much exercise as he would like Has some back issues.   November 27, 2016: Todd [name] is seen today with his new wife, Todd [name].  Has had some heart burn recently .   Occurs spontaneously.   Not associated with exertion  No Cp or dyspnea with any exertion   Resolved with Prilosec.    Not getting enough exercise,   Still traveling  July 03, 2017 :  Todd [name] is seen today  No CP , no dyspnea.    Still working .  Will be moving to [place]  in several weeks.  January 15, 9355: Todd [name] seen today for follow-up visit.  He has a history of hypertension and hyperlipidemia. Has moved to [facility] since I last saw him. No CP or dyspnea.  Has not been exercising as much .   Jan. 31, 2022 Todd [name] is seen today for follow up visit for his HTN and HLD. Has moved to [place], [place]  Has DM.  Is not exercising much .  No CP , dyspnea.   Had lipids drawn this am at Dr. Erick Blinks office   Jan. 27, 2023; Todd [name] is seen today for follow up visit  for his HTN and HLD.  Has moved to [place], [place]  No CP , no dyspnea Not exercising as much ,    Has back issues that slow him down .   He recently had an episode of diverticulitis and wound up in the ER in [place], [place] [facility].  Comprehensive metabolic profile there showed normal liver enzymes.  Sodium was 140, potassium  is 4.0.  Creatinine 0.99.  Glucose was 158 (nonfasting)  Past Medical History:  Diagnosis Date   DIABETES MELLITUS, TYPE II 08/08/2009   Hiatal hernia    History of nuclear stress test    Myoview 02/2019: EF 54, apical inf and apical artifact, normal perfusion, low risk   HYPERLIPIDEMIA 08/08/2009   Palpitations    URINARY INCONTINENCE 08/08/2009    Past Surgical History:  Procedure Laterality Date   ACHILLES TENDON REPAIR  2002   rupture   APPENDECTOMY  1976   CARDIOVASCULAR STRESS TEST  11-02-2003   EF 57%   TONSILLECTOMY  1961   US ECHOCARDIOGRAPHY  02-10-2001   EF 60-65%     Current Outpatient Medications  Medication Sig Dispense Refill   Coenzyme Q10 (CO Q 10 PO) Take 200 mg by mouth daily.     fenofibrate 160 MG tablet TAKE 1 TABLET BY MOUTH  DAILY 90 tablet 3   finasteride (PROSCAR) 5 MG tablet Take 5 mg by mouth daily.     metFORMIN (GLUCOPHAGE) 1000 MG tablet TAKE 1 TABLET BY MOUTH  TWICE DAILY WITH MEALS 180 tablet 3   omeprazole (PRILOSEC) 40 MG capsule 1 cap(s) orally 20 minutes before breakfast for 30 day(s     polycarbophil (FIBERCON) 625 MG tablet Take 625 mg by mouth daily. Taking 2 Tablet     rosuvastatin (CRESTOR) 10 MG tablet TAKE 1 TABLET BY MOUTH  DAILY 90 tablet 3   Semaglutide,0.25 or 0.5MG /DOS, (OZEMPIC, 0.25 OR 0.5 MG/DOSE,) 2 MG/1.5ML SOPN Start 0.25 mg [place] once weekly for 2 weeks and then increase to 0.5 mg [place] once weekly. 4.5 mL 3   vitamin B-12 (CYANOCOBALAMIN) 1000 MCG tablet Take 1,000 mcg by mouth daily.     VITAMIN D, ERGOCALCIFEROL, PO Take 2,000 Units by mouth daily.      TRULICITY 1.5 SV/7.7LT SOPN INJECT THE CONTENTS OF ONE  PEN SUBCUTANEOUSLY WEEKLY  AS DIRECTED 6 mL 3   No current facility-administered medications for this visit.    Allergies:   Jardiance [empagliflozin], Diphenhydramine-phenylephrine, Lisinopril, Pseudoephedrine hcl, Sudafed pe sinus cong day-nght  [diphenhydramine-phenylephrine], Triprolidine-pse, Actifed cold-sinus, and  Pseudoephedrine    Social History:  The patient  reports that he quit smoking about 43 years ago. His smoking use included cigarettes. He has a 20.00 pack-year smoking history. He  has never used smokeless tobacco. He reports current alcohol use. He reports that he does not use drugs.   Family History:  The patient's family history includes Diabetes in his sister; Mental illness in his mother.    ROS: Noted in current history, otherwise review of systems is negative.   Physical Exam: Blood pressure 132/76, pulse 87, height 6\' 2"  (1.88 m), weight 214 lb 6.4 oz (97.3 kg), SpO2 99 %.  GEN:  Well nourished, well developed in no acute distress HEENT: Normal NECK: No JVD; No carotid bruits LYMPHATICS: No lymphadenopathy CARDIAC: RRR   RESPIRATORY:  Clear to auscultation without rales, wheezing or rhonchi  ABDOMEN: Soft, non-tender, non-distended MUSCULOSKELETAL:  No edema; No deformity  SKIN: Warm and dry NEUROLOGIC:  Alert and oriented x 3  EKG:    February 02, 2021: Normal sinus rhythm at 87.  Nonspecific ST and T wave abnormalities in the inferior leads.  These ST abnormalities are unchanged compared to previous EKG in January, 2022.  The PVCs have resolved since January, 2022.  Recent Labs: 02/07/2020: ALT 23; BUN 19; Creatinine, Ser 0.94; Hemoglobin 14.5; Platelets 271.0; Potassium 4.7; Sodium 139    Lipid Panel    Component Value Date/Time   CHOL 141 02/07/2020 1135   CHOL 143 01/14/2018 1146   TRIG 200.0 (H) 02/07/2020 1135   HDL 37.30 (L) 02/07/2020 1135   HDL 37 (L) 01/14/2018 1146   CHOLHDL 4 02/07/2020 1135   VLDL 40.0 02/07/2020 1135   LDLCALC 63 02/07/2020 1135   LDLCALC 72 01/14/2018 1146   LDLDIRECT 84.0 06/11/2018 1052      Wt Readings from Last 3 Encounters:  02/02/21 214 lb 6.4 oz (97.3 kg)  12/06/20 226 lb (102.5 kg)  07/18/20 230 lb 12.8 oz (104.7 kg)      Other studies Reviewed: Additional studies/ records that were reviewed today include: . Review  of the above records demonstrates:    ASSESSMENT AND PLAN:  1. Hypercholesterolemia-     check lipids today .   BMP and liver function was measured at [facility] in [place] recently    2. PVCs  -  have resolved   3. History of chest pain-    no recent CP   4. Diabetes mellitus - managed by primary MD    5. Hypertension-  BP is well controlled  Advised more exercise  Cont to watch diet        Current medicines are reviewed at length with the patient today.  The patient does not have concerns regarding medicines.  The following changes have been made:  no change  Labs/ tests ordered today include:   Orders Placed This Encounter  Procedures   Lipid panel   EKG 12-Lead     Disposition:   FU with and APP or me in 1 year    Signed, Mertie Moores, MD  02/02/2021 11:18 AM    [address], [place], [place]  [address] Phone: [phone]; Fax: [phone]

## 2021-02-02 ENCOUNTER — Encounter: Payer: Self-pay | Admitting: Cardiovascular Disease

## 2021-02-02 ENCOUNTER — Other Ambulatory Visit: Payer: Self-pay

## 2021-02-02 ENCOUNTER — Ambulatory Visit: Payer: Medicare Other | Admitting: Cardiovascular Disease

## 2021-02-02 VITALS — BP 132/76 | HR 87 | Ht 74.0 in | Wt 214.4 lb

## 2021-02-02 DIAGNOSIS — I493 Ventricular premature depolarization: Secondary | ICD-10-CM | POA: Diagnosis not present

## 2021-02-02 DIAGNOSIS — E782 Mixed hyperlipidemia: Secondary | ICD-10-CM | POA: Diagnosis not present

## 2021-02-02 DIAGNOSIS — I10 Essential (primary) hypertension: Secondary | ICD-10-CM

## 2021-02-02 LAB — LIPID PANEL
Chol/HDL Ratio: 3.8 ratio (ref 0.0–5.0)
Cholesterol, Total: 125 mg/dL (ref 100–199)
HDL: 33 mg/dL — ABNORMAL LOW (ref >39)
LDL Chol Calc (NIH): 69 mg/dL (ref 0–99)
Triglycerides: 130 mg/dL (ref 0–149)
VLDL Cholesterol Cal: 23 mg/dL (ref 5–40)

## 2021-02-02 NOTE — Patient Instructions (Addendum)
Medication Instructions:  Your physician recommends that you continue on your current medications as directed. Please refer to the Current Medication list given to you today.  *If you need a refill on your cardiac medications before your next appointment, please call your pharmacy*   Lab Work: TODAY:  LIPID  If you have labs (blood work) drawn today and your tests are completely normal, you will receive your results only by: [facility] (if you have MyChart) OR A paper copy in the mail If you have any lab test that is abnormal or we need to change your treatment, we will call you to review the results.   Testing/Procedures: None ordered   Follow-Up: At [facility], you and your health needs are our priority.  As part of our continuing mission to provide you with exceptional heart care, we have created designated Provider Care Teams.  These Care Teams include your primary Cardiologist (physician) and Advanced Practice Providers (APPs -  Physician Assistants and Nurse Practitioners) who all work together to provide you with the care you need, when you need it.  We recommend signing up for the patient portal called "MyChart".  Sign up information is provided on this After Visit Summary.  MyChart is used to connect with patients for Virtual Visits (Telemedicine).  Patients are able to view lab/test results, encounter notes, upcoming appointments, etc.  Non-urgent messages can be sent to your provider as well.   To learn more about what you can do with MyChart, go to [email or web address].    Your next appointment:   12 month(s)  The format for your next appointment:   In Person  Provider:   Mertie Moores, MD  or Robbie Lis, PA-C, Christen Bame, NP, or Richardson Dopp, PA-C         Other Instructions

## 2021-02-06 ENCOUNTER — Other Ambulatory Visit: Payer: Self-pay | Admitting: Family Medicine

## 2021-02-10 DIAGNOSIS — M9905 Segmental and somatic dysfunction of pelvic region: Secondary | ICD-10-CM | POA: Diagnosis not present

## 2021-02-10 DIAGNOSIS — M9901 Segmental and somatic dysfunction of cervical region: Secondary | ICD-10-CM | POA: Diagnosis not present

## 2021-02-10 DIAGNOSIS — M5030 Other cervical disc degeneration, unspecified cervical region: Secondary | ICD-10-CM | POA: Diagnosis not present

## 2021-02-17 DIAGNOSIS — M9901 Segmental and somatic dysfunction of cervical region: Secondary | ICD-10-CM | POA: Diagnosis not present

## 2021-02-17 DIAGNOSIS — M9905 Segmental and somatic dysfunction of pelvic region: Secondary | ICD-10-CM | POA: Diagnosis not present

## 2021-02-17 DIAGNOSIS — M5030 Other cervical disc degeneration, unspecified cervical region: Secondary | ICD-10-CM | POA: Diagnosis not present

## 2021-02-24 DIAGNOSIS — M9901 Segmental and somatic dysfunction of cervical region: Secondary | ICD-10-CM | POA: Diagnosis not present

## 2021-02-24 DIAGNOSIS — M5030 Other cervical disc degeneration, unspecified cervical region: Secondary | ICD-10-CM | POA: Diagnosis not present

## 2021-02-24 DIAGNOSIS — M9905 Segmental and somatic dysfunction of pelvic region: Secondary | ICD-10-CM | POA: Diagnosis not present

## 2021-02-26 DIAGNOSIS — M5416 Radiculopathy, lumbar region: Secondary | ICD-10-CM | POA: Diagnosis not present

## 2021-03-03 DIAGNOSIS — M9905 Segmental and somatic dysfunction of pelvic region: Secondary | ICD-10-CM | POA: Diagnosis not present

## 2021-03-03 DIAGNOSIS — M5030 Other cervical disc degeneration, unspecified cervical region: Secondary | ICD-10-CM | POA: Diagnosis not present

## 2021-03-03 DIAGNOSIS — M9901 Segmental and somatic dysfunction of cervical region: Secondary | ICD-10-CM | POA: Diagnosis not present

## 2021-03-07 ENCOUNTER — Encounter: Payer: Self-pay | Admitting: Family Medicine

## 2021-03-07 ENCOUNTER — Ambulatory Visit (INDEPENDENT_AMBULATORY_CARE_PROVIDER_SITE_OTHER): Payer: Medicare Other | Admitting: Family Medicine

## 2021-03-07 VITALS — BP 134/82 | HR 74 | Temp 97.3°F | Resp 16 | Ht 74.0 in | Wt 211.2 lb

## 2021-03-07 DIAGNOSIS — E119 Type 2 diabetes mellitus without complications: Secondary | ICD-10-CM

## 2021-03-07 LAB — POCT GLYCOSYLATED HEMOGLOBIN (HGB A1C): Hemoglobin A1C: 7.7 % — AB (ref 4.0–5.6)

## 2021-03-07 MED ORDER — OZEMPIC (1 MG/DOSE) 4 MG/3ML ~~LOC~~ SOPN: 1.0000 mg | PEN_INJECTOR | SUBCUTANEOUS | 3 refills | Status: DC

## 2021-03-07 NOTE — Patient Instructions (Signed)
Go ahead and increase the Ozempic to 1 mg once weekly ?

## 2021-03-07 NOTE — Progress Notes (Signed)
? ?Established Patient Office Visit ? ?Subjective:  ?Patient ID: Todd [name], male    DOB: 1945-06-11  Age: 76 y.o. MRN: [id number] ? ?CC:  ?Chief Complaint  ?Patient presents with  ? Follow-up  ?  Pt has no concerns or problems  ? ? ?HPI ?Todd [name] presents for diabetes follow-up.  We recently switched him from Trulicity to [place] currently 0.5 mg subcutaneous once weekly.  Tolerating well with no side effects.  Previous intolerance with Jardiance.  He also remains on metformin.  He had recent lipids per cardiology and these were stable.  His blood pressures been well controlled.  He generally feels well overall.  He had repeat colonoscopy last year down in [place] and we cannot find records of that ? ?He had recent acute diverticulitis flareup and symptoms promptly improved with antibiotics. ? ?Past Medical History:  ?Diagnosis Date  ? DIABETES MELLITUS, TYPE II 08/08/2009  ? Hiatal hernia   ? History of nuclear stress test   ? Myoview 02/2019: EF 54, apical inf and apical artifact, normal perfusion, low risk  ? HYPERLIPIDEMIA 08/08/2009  ? Palpitations   ? URINARY INCONTINENCE 08/08/2009  ? ? ?Past Surgical History:  ?Procedure Laterality Date  ? ACHILLES TENDON REPAIR  2002  ? rupture  ? APPENDECTOMY  1976  ? CARDIOVASCULAR STRESS TEST  11-02-2003  ? EF 57%  ? TONSILLECTOMY  1961  ? US ECHOCARDIOGRAPHY  02-10-2001  ? EF 60-65%  ? ? ?Family History  ?Problem Relation Age of Onset  ? Mental illness Mother   ? Diabetes Sister   ?     type ll  ? ? ?Social History  ? ?Socioeconomic History  ? Marital status: Married  ?  Spouse name: Not on file  ? Number of children: Not on file  ? Years of education: Not on file  ? Highest education level: Bachelor's degree (e.g., BA, AB, BS)  ?Occupational History  ? Not on file  ?Tobacco Use  ? Smoking status: Former  ?  Packs/day: 2.00  ?  Years: 10.00  ?  Pack years: 20.00  ?  Types: Cigarettes  ?  Quit date: 04/19/1977  ?  Years since quitting: 43.9  ? Smokeless tobacco: Never   ? Tobacco comments:  ?  discussed AAA but think he has had this  ?Vaping Use  ? Vaping Use: Never used  ?Substance and Sexual Activity  ? Alcohol use: Yes  ?  Comment:  glass of wine x 2 per week  ? Drug use: No  ? Sexual activity: Not on file  ?Other Topics Concern  ? Not on file  ?Social History Narrative  ? Not on file  ? ?Social Determinants of Health  ? ?Financial Resource Strain: Low Risk   ? Difficulty of Paying Living Expenses: Not hard at all  ?Food Insecurity: No Food Insecurity  ? Worried About Charity fundraiser in the Last Year: Never true  ? Ran Out of Food in the Last Year: Never true  ?Transportation Needs: No Transportation Needs  ? Lack of Transportation (Medical): No  ? Lack of Transportation (Non-Medical): No  ?Physical Activity: Insufficiently Active  ? Days of Exercise per Week: 2 days  ? Minutes of Exercise per Session: 30 min  ?Stress: No Stress Concern Present  ? Feeling of Stress : Not at all  ?Social Connections: Socially Integrated  ? Frequency of Communication with Friends and Family: More than three times a week  ? Frequency of  Social Gatherings with Friends and Family: Twice a week  ? Attends Religious Services: More than 4 times per year  ? Active Member of Clubs or Organizations: Yes  ? Attends Archivist Meetings: More than 4 times per year  ? Marital Status: Married  ?Intimate Partner Violence: Not At Risk  ? Fear of Current or Ex-Partner: No  ? Emotionally Abused: No  ? Physically Abused: No  ? Sexually Abused: No  ? ? ?Outpatient Medications Prior to Visit  ?Medication Sig Dispense Refill  ? Coenzyme Q10 (CO Q 10 PO) Take 200 mg by mouth daily.    ? fenofibrate 160 MG tablet TAKE 1 TABLET BY MOUTH  DAILY 90 tablet 3  ? finasteride (PROSCAR) 5 MG tablet Take 5 mg by mouth daily.    ? metFORMIN (GLUCOPHAGE) 1000 MG tablet TAKE 1 TABLET BY MOUTH  TWICE DAILY WITH MEALS 180 tablet 3  ? omeprazole (PRILOSEC) 40 MG capsule 1 cap(s) orally 20 minutes before breakfast for 30  day(s    ? polycarbophil (FIBERCON) 625 MG tablet Take 625 mg by mouth daily. Taking 2 Tablet    ? rosuvastatin (CRESTOR) 10 MG tablet TAKE 1 TABLET BY MOUTH  DAILY 90 tablet 3  ? vitamin B-12 (CYANOCOBALAMIN) 1000 MCG tablet Take 1,000 mcg by mouth daily.    ? VITAMIN D, ERGOCALCIFEROL, PO Take 2,000 Units by mouth daily.     ? Semaglutide,0.25 or 0.5MG /DOS, (OZEMPIC, 0.25 OR 0.5 MG/DOSE,) 2 MG/1.5ML SOPN Start 0.25 mg [place] once weekly for 2 weeks and then increase to 0.5 mg [place] once weekly. 4.5 mL 3  ? TRULICITY 1.5 MA/2.6JF SOPN INJECT THE CONTENTS OF ONE  PEN SUBCUTANEOUSLY WEEKLY  AS DIRECTED 6 mL 3  ? ?No facility-administered medications prior to visit.  ? ? ?Allergies  ?Allergen Reactions  ? Jardiance [Empagliflozin]   ?  Yeast infections  ? Diphenhydramine-Phenylephrine Other (See Comments)  ? Lisinopril Swelling  ? Pseudoephedrine Hcl Other (See Comments)  ? Sudafed Pe Sinus Cong Day-Nght  [Diphenhydramine-Phenylephrine] Other (See Comments)  ? Triprolidine-Pse Rash  ? Actifed Cold-Sinus   ?  rash  ? Pseudoephedrine   ?  REACTION: hives  ? ? ?ROS ?Review of Systems  ?Constitutional:  Negative for fatigue and unexpected weight change.  ?Eyes:  Negative for visual disturbance.  ?Respiratory:  Negative for cough, chest tightness and shortness of breath.   ?Cardiovascular:  Negative for chest pain, palpitations and leg swelling.  ?Endocrine: Negative for polydipsia and polyuria.  ?Neurological:  Negative for dizziness, syncope, weakness, light-headedness and headaches.  ? ?  ?Objective:  ?  ?Physical Exam ?Vitals reviewed.  ?Constitutional:   ?   Appearance: Normal appearance.  ?Cardiovascular:  ?   Rate and Rhythm: Normal rate and regular rhythm.  ?Pulmonary:  ?   Effort: Pulmonary effort is normal.  ?   Breath sounds: Normal breath sounds.  ?Neurological:  ?   Mental Status: He is alert.  ? ? ?BP 134/82   Pulse 74   Temp (!) 97.3 ?F (36.3 ?C)   Resp 16   Ht 6\' 2"  (1.88 m)   Wt 211 lb 3.2 oz (95.8 kg)    SpO2 97%   BMI 27.12 kg/m?  ?Wt Readings from Last 3 Encounters:  ?03/07/21 211 lb 3.2 oz (95.8 kg)  ?02/02/21 214 lb 6.4 oz (97.3 kg)  ?12/06/20 226 lb (102.5 kg)  ? ? ? ?Health Maintenance Due  ?Topic Date Due  ? TETANUS/TDAP  11/07/2017  ?  COVID-19 Vaccine (5 - Booster for Pfizer series) 07/03/2020  ? COLONOSCOPY (Pts 45-52yrs Insurance coverage will need to be confirmed)  10/09/2020  ? ? ?There are no preventive care reminders to display for this patient. ? ?Lab Results  ?Component Value Date  ? TSH 0.80 09/08/2013  ? ?Lab Results  ?Component Value Date  ? WBC 6.6 02/07/2020  ? HGB 14.5 02/07/2020  ? HCT 44.0 02/07/2020  ? MCV 84.0 02/07/2020  ? PLT 271.0 02/07/2020  ? ?Lab Results  ?Component Value Date  ? NA 139 02/07/2020  ? K 4.7 02/07/2020  ? CO2 28 02/07/2020  ? GLUCOSE 118 (H) 02/07/2020  ? BUN 19 02/07/2020  ? CREATININE 0.94 02/07/2020  ? BILITOT 0.4 02/07/2020  ? ALKPHOS 53 02/07/2020  ? AST 13 02/07/2020  ? ALT 23 02/07/2020  ? PROT 7.7 02/07/2020  ? ALBUMIN 4.7 02/07/2020  ? CALCIUM 10.3 02/07/2020  ? GFR 80.48 02/07/2020  ? ?Lab Results  ?Component Value Date  ? CHOL 125 02/02/2021  ? ?Lab Results  ?Component Value Date  ? HDL 33 (L) 02/02/2021  ? ?Lab Results  ?Component Value Date  ? [place] 69 02/02/2021  ? ?Lab Results  ?Component Value Date  ? TRIG 130 02/02/2021  ? ?Lab Results  ?Component Value Date  ? CHOLHDL 3.8 02/02/2021  ? ?Lab Results  ?Component Value Date  ? HGBA1C 7.7 (A) 03/07/2021  ? ? ?  ?Assessment & Plan:  ? ?#1 type 2 diabetes improving with A1c reduction from 8.4% to 7.7% today.  His weight has been coming down some on the Ozempic.  Titrate further to 1 mg subcutaneous once weekly and reassess A1c in 3 months.  Hopefully will be to goal at that point. ?-Continue yearly eye exam ?-Consider repeat urine microalbumin with next follow-up ? ? ?Meds ordered this encounter  ?Medications  ? Semaglutide, 1 MG/DOSE, (OZEMPIC, 1 MG/DOSE,) 4 MG/3ML SOPN  ?  Sig: Inject 1 mg into the skin  once a week.  ?  Dispense:  9 mL  ?  Refill:  3  ? ? ?Follow-up: Return in about 3 months (around 06/07/2021).  ? ? ?Carolann Littler, MD ?

## 2021-03-08 ENCOUNTER — Encounter: Payer: Self-pay | Admitting: Family Medicine

## 2021-03-10 DIAGNOSIS — M9905 Segmental and somatic dysfunction of pelvic region: Secondary | ICD-10-CM | POA: Diagnosis not present

## 2021-03-10 DIAGNOSIS — M9901 Segmental and somatic dysfunction of cervical region: Secondary | ICD-10-CM | POA: Diagnosis not present

## 2021-03-14 DIAGNOSIS — K5792 Diverticulitis of intestine, part unspecified, without perforation or abscess without bleeding: Secondary | ICD-10-CM | POA: Diagnosis not present

## 2021-03-17 DIAGNOSIS — M9905 Segmental and somatic dysfunction of pelvic region: Secondary | ICD-10-CM | POA: Diagnosis not present

## 2021-03-17 DIAGNOSIS — M9901 Segmental and somatic dysfunction of cervical region: Secondary | ICD-10-CM | POA: Diagnosis not present

## 2021-03-24 DIAGNOSIS — M9901 Segmental and somatic dysfunction of cervical region: Secondary | ICD-10-CM | POA: Diagnosis not present

## 2021-03-24 DIAGNOSIS — M9905 Segmental and somatic dysfunction of pelvic region: Secondary | ICD-10-CM | POA: Diagnosis not present

## 2021-03-27 DIAGNOSIS — R030 Elevated blood-pressure reading, without diagnosis of hypertension: Secondary | ICD-10-CM | POA: Diagnosis not present

## 2021-03-27 DIAGNOSIS — M5416 Radiculopathy, lumbar region: Secondary | ICD-10-CM | POA: Diagnosis not present

## 2021-03-31 DIAGNOSIS — M9901 Segmental and somatic dysfunction of cervical region: Secondary | ICD-10-CM | POA: Diagnosis not present

## 2021-03-31 DIAGNOSIS — M5030 Other cervical disc degeneration, unspecified cervical region: Secondary | ICD-10-CM | POA: Diagnosis not present

## 2021-03-31 DIAGNOSIS — M9905 Segmental and somatic dysfunction of pelvic region: Secondary | ICD-10-CM | POA: Diagnosis not present

## 2021-04-05 DIAGNOSIS — M9901 Segmental and somatic dysfunction of cervical region: Secondary | ICD-10-CM | POA: Diagnosis not present

## 2021-04-05 DIAGNOSIS — M5134 Other intervertebral disc degeneration, thoracic region: Secondary | ICD-10-CM | POA: Diagnosis not present

## 2021-04-05 DIAGNOSIS — M9905 Segmental and somatic dysfunction of pelvic region: Secondary | ICD-10-CM | POA: Diagnosis not present

## 2021-04-05 DIAGNOSIS — M9902 Segmental and somatic dysfunction of thoracic region: Secondary | ICD-10-CM | POA: Diagnosis not present

## 2021-04-05 DIAGNOSIS — M5030 Other cervical disc degeneration, unspecified cervical region: Secondary | ICD-10-CM | POA: Diagnosis not present

## 2021-04-05 DIAGNOSIS — M5136 Other intervertebral disc degeneration, lumbar region: Secondary | ICD-10-CM | POA: Diagnosis not present

## 2021-04-14 DIAGNOSIS — M9901 Segmental and somatic dysfunction of cervical region: Secondary | ICD-10-CM | POA: Diagnosis not present

## 2021-04-14 DIAGNOSIS — M5030 Other cervical disc degeneration, unspecified cervical region: Secondary | ICD-10-CM | POA: Diagnosis not present

## 2021-04-14 DIAGNOSIS — M9905 Segmental and somatic dysfunction of pelvic region: Secondary | ICD-10-CM | POA: Diagnosis not present

## 2021-04-17 ENCOUNTER — Ambulatory Visit: Payer: Medicare Other

## 2021-04-17 ENCOUNTER — Ambulatory Visit (INDEPENDENT_AMBULATORY_CARE_PROVIDER_SITE_OTHER): Payer: Medicare Other

## 2021-04-17 VITALS — Ht 74.0 in | Wt 204.0 lb

## 2021-04-17 DIAGNOSIS — Z0000 Encounter for general adult medical examination without abnormal findings: Secondary | ICD-10-CM | POA: Diagnosis not present

## 2021-04-17 NOTE — Progress Notes (Signed)
? ?Subjective:  ? Todd [name] is a 76 y.o. male who presents for Medicare Annual/Subsequent preventive examination. ? ?Review of Systems    ?Virtual Visit via Telephone Note ? ?I connected with  Todd [name] on 04/17/21 at  8:45 AM EDT by telephone and verified that I am speaking with the correct person using two identifiers. ? ?Location: ?Patient: Home ?Provider: Office ?Persons participating in the virtual visit: patient/Nurse Health Advisor ?  ?I discussed the limitations, risks, security and privacy concerns of performing an evaluation and management service by telephone and the availability of in person appointments. The patient expressed understanding and agreed to proceed. ? ?Interactive audio and video telecommunications were attempted between this nurse and patient, however failed, due to patient having technical difficulties OR patient did not have access to video capability.  We continued and completed visit with audio only. ? ?Some vital signs may be absent or patient reported.  ? ?[name], LPN  ?Cardiac Risk Factors include: advanced age (>48mn, >>60women);diabetes mellitus;male gender;hypertension ? ?   ?Objective:  ?  ?Today's Vitals  ? 04/17/21 0844  ?Weight: 204 lb (92.5 kg)  ?Height: '6\' 2"'$  (1.88 m)  ? ?Body mass index is 26.19 kg/m?. ? ? ?  04/17/2021  ?  8:52 AM 04/11/2020  ?  8:54 AM 04/29/2017  ?  1:34 PM  ?Advanced Directives  ?Does Patient Have a Medical Advance Directive? No No No  ?Would patient like information on creating a medical advance directive? No - Patient declined No - Patient declined   ? ? ?Current Medications (verified) ?Outpatient Encounter Medications as of 04/17/2021  ?Medication Sig  ? Coenzyme Q10 (CO Q 10 PO) Take 200 mg by mouth daily.  ? fenofibrate 160 MG tablet TAKE 1 TABLET BY MOUTH  DAILY  ? finasteride (PROSCAR) 5 MG tablet Take 5 mg by mouth daily.  ? metFORMIN (GLUCOPHAGE) 1000 MG tablet TAKE 1 TABLET BY MOUTH  TWICE DAILY WITH MEALS  ? omeprazole  (PRILOSEC) 40 MG capsule 1 cap(s) orally 20 minutes before breakfast for 30 day(s  ? polycarbophil (FIBERCON) 625 MG tablet Take 625 mg by mouth daily. Taking 2 Tablet  ? rosuvastatin (CRESTOR) 10 MG tablet TAKE 1 TABLET BY MOUTH  DAILY  ? Semaglutide, 1 MG/DOSE, (OZEMPIC, 1 MG/DOSE,) 4 MG/3ML SOPN Inject 1 mg into the skin once a week.  ? vitamin B-12 (CYANOCOBALAMIN) 1000 MCG tablet Take 1,000 mcg by mouth daily.  ? VITAMIN D, ERGOCALCIFEROL, PO Take 2,000 Units by mouth daily.   ? ?No facility-administered encounter medications on file as of 04/17/2021.  ? ? ?Allergies (verified) ?Jardiance [empagliflozin], Diphenhydramine-phenylephrine, Lisinopril, Pseudoephedrine hcl, Sudafed pe sinus cong day-nght  [diphenhydramine-phenylephrine], Triprolidine-pse, Actifed cold-sinus, and Pseudoephedrine  ? ?History: ?Past Medical History:  ?Diagnosis Date  ? DIABETES MELLITUS, TYPE II 08/08/2009  ? Hiatal hernia   ? History of nuclear stress test   ? Myoview 02/2019: EF 54, apical inf and apical artifact, normal perfusion, low risk  ? HYPERLIPIDEMIA 08/08/2009  ? Palpitations   ? URINARY INCONTINENCE 08/08/2009  ? ?Past Surgical History:  ?Procedure Laterality Date  ? ACHILLES TENDON REPAIR  2002  ? rupture  ? APPENDECTOMY  1976  ? CARDIOVASCULAR STRESS TEST  11-02-2003  ? EF 57%  ? TONSILLECTOMY  1961  ? UKoreaECHOCARDIOGRAPHY  02-10-2001  ? EF 60-65%  ? ?Family History  ?Problem Relation Age of Onset  ? Mental illness Mother   ? Diabetes Sister   ?  type ll  ? ?Social History  ? ?Socioeconomic History  ? Marital status: Married  ?  Spouse name: Not on file  ? Number of children: Not on file  ? Years of education: Not on file  ? Highest education level: Bachelor's degree (e.g., BA, AB, BS)  ?Occupational History  ? Not on file  ?Tobacco Use  ? Smoking status: Former  ?  Packs/day: 2.00  ?  Years: 10.00  ?  Pack years: 20.00  ?  Types: Cigarettes  ?  Quit date: 04/19/1977  ?  Years since quitting: 44.0  ? Smokeless tobacco: Never  ?  Tobacco comments:  ?  discussed AAA but think he has had this  ?Vaping Use  ? Vaping Use: Never used  ?Substance and Sexual Activity  ? Alcohol use: Yes  ?  Comment:  glass of wine x 2 per week  ? Drug use: No  ? Sexual activity: Not on file  ?Other Topics Concern  ? Not on file  ?Social History Narrative  ? Not on file  ? ?Social Determinants of Health  ? ?Financial Resource Strain: Low Risk   ? Difficulty of Paying Living Expenses: Not hard at all  ?Food Insecurity: No Food Insecurity  ? Worried About Charity fundraiser in the Last Year: Never true  ? Ran Out of Food in the Last Year: Never true  ?Transportation Needs: No Transportation Needs  ? Lack of Transportation (Medical): No  ? Lack of Transportation (Non-Medical): No  ?Physical Activity: Insufficiently Active  ? Days of Exercise per Week: 7 days  ? Minutes of Exercise per Session: 10 min  ?Stress: No Stress Concern Present  ? Feeling of Stress : Not at all  ?Social Connections: Socially Integrated  ? Frequency of Communication with Friends and Family: More than three times a week  ? Frequency of Social Gatherings with Friends and Family: More than three times a week  ? Attends Religious Services: More than 4 times per year  ? Active Member of Clubs or Organizations: Yes  ? Attends Archivist Meetings: More than 4 times per year  ? Marital Status: Married  ? ? ? ?Clinical Intake: ?Nutrition Risk Assessment: ? ?Has the patient had any N/V/D within the last 2 months?  No  ?Does the patient have any non-healing wounds?  No  ?Has the patient had any unintentional weight loss or weight gain?  No  ? ?Diabetes: ? ?Is the patient diabetic?  Yes  ?If diabetic, was a CBG obtained today?  No  ?Did the patient bring in their glucometer from home?  No  ?How often do you monitor your CBG's? PRN.  ? ?Financial Strains and Diabetes Management: ? ?Are you having any financial strains with the device, your supplies or your medication? No .  ?Does the patient want  to be seen by Chronic Care Management for management of their diabetes?  No  ?Would the patient like to be referred to a Nutritionist or for Diabetic Management?  No Followed by PCP ? ?Diabetic Exams: ? ?Diabetic Eye Exam: Completed Yes. Overdue for diabetic eye exam. Pt has been advised about the importance in completing this exam. A referral has been placed today. Message sent to referral coordinator for scheduling purposes. Advised pt to expect a call from office referred to regarding appt. ? ?Diabetic Foot Exam: Completed Yes. Pt has been advised about the importance in completing this exam. Pt is scheduled for diabetic foot exam on Followed  by PCP.   ?Pre-visit preparation completed: NoHow often do you need to have someone help you when you read instructions, pamphlets, or other written materials from your doctor or pharmacy?: 1 - Never ? ?Diabetic?  Yes ? ?Activities of Daily Living ? ?  04/17/2021  ?  8:50 AM  ?In your present state of health, do you have any difficulty performing the following activities:  ?Hearing? 0  ?Vision? 0  ?Difficulty concentrating or making decisions? 0  ?Walking or climbing stairs? 0  ?Dressing or bathing? 0  ?Doing errands, shopping? 0  ?Preparing Food and eating ? N  ?Using the Toilet? N  ?In the past six months, have you accidently leaked urine? N  ?Do you have problems with loss of bowel control? N  ?Managing your Medications? N  ?Managing your Finances? N  ?Housekeeping or managing your Housekeeping? N  ? ? ?Patient Care Team: ?[name], MD as PCP - General ?[name], [name], MD as PCP - Cardiology (Cardiology) ?Viona Gilmore, [facility] as Pharmacist (Pharmacist) ? ?Indicate any recent Medical Services you may have received from other than [facility] providers in the past year (date may be approximate). ? ?   ?Assessment:  ? This is a routine wellness examination for Cameren. ? ?Hearing/Vision screen ?Hearing Screening - Comments:: No hearing difficulty ?Vision Screening -  Comments:: Wears glasses. Followed by Dr Littie Deeds ? ?Dietary issues and exercise activities discussed: ?Exercise limited by: None identified ? ? Goals Addressed   ? ?  ?  ?  ?  ?  ? This Visit's Progress  ?   Increase

## 2021-04-17 NOTE — Patient Instructions (Addendum)
?Mr. [name] , ?Thank you for taking time to come for your Medicare Wellness Visit. I appreciate your ongoing commitment to your health goals. Please review the following plan we discussed and let me know if I can assist you in the future.  ? ?These are the goals we discussed: ? Goals   ? ?   Exercise 150 min/wk Moderate Activity   ?   Likes to power walk and bike ?Wife likes to walk as well ?Has a 2 mile track at his home. ? ?  ?   Exercise 150 minutes per week (moderate activity)   ?   Walking 10000 steps per day!!! ?  ?   Increase physical activity (pt-stated)   ?   Lose weight ?  ?   Patient Stated   ?   Lose 20lbs  ?  ? ?  ?  ?This is a list of the screening recommended for you and due dates:  ?Health Maintenance  ?Topic Date Due  ? COVID-19 Vaccine (5 - Booster for Pfizer series) 05/03/2021*  ? Tetanus Vaccine  04/18/2022*  ? Complete foot exam   07/18/2021  ? Urine Protein Check  07/18/2021  ? Flu Shot  08/07/2021  ? Hemoglobin A1C  09/07/2021  ? Eye exam for diabetics  01/25/2022  ? Colon Cancer Screening  05/17/2030  ? Pneumonia Vaccine  Completed  ? Hepatitis C Screening: USPSTF Recommendation to screen - Ages 60-79 yo.  Completed  ? Zoster (Shingles) Vaccine  Completed  ? HPV Vaccine  Aged Out  ?*Topic was postponed. The date shown is not the original due date.  ? ?Advanced directives: No  ? ?Conditions/risks identified: None ? ?Next appointment: Follow up in one year for your annual wellness visit.  ? ?Preventive Care 75 Years and Older, Male ?Preventive care refers to lifestyle choices and visits with your health care provider that can promote health and wellness. ?What does preventive care include? ?A yearly physical exam. This is also called an annual well check. ?Dental exams once or twice a year. ?Routine eye exams. Ask your health care provider how often you should have your eyes checked. ?Personal lifestyle choices, including: ?Daily care of your teeth and gums. ?Regular physical activity. ?Eating  a healthy diet. ?Avoiding tobacco and drug use. ?Limiting alcohol use. ?Practicing safe sex. ?Taking low doses of aspirin every day. ?Taking vitamin and mineral supplements as recommended by your health care provider. ?What happens during an annual well check? ?The services and screenings done by your health care provider during your annual well check will depend on your age, overall health, lifestyle risk factors, and family history of disease. ?Counseling  ?Your health care provider may ask you questions about your: ?Alcohol use. ?Tobacco use. ?Drug use. ?Emotional well-being. ?Home and relationship well-being. ?Sexual activity. ?Eating habits. ?History of falls. ?Memory and ability to understand (cognition). ?Work and work Statistician. ?Screening  ?You may have the following tests or measurements: ?Height, weight, and BMI. ?Blood pressure. ?Lipid and cholesterol levels. These may be checked every 5 years, or more frequently if you are over 42 years old. ?Skin check. ?Lung cancer screening. You may have this screening every year starting at age 78 if you have a 30-pack-year history of smoking and currently smoke or have quit within the past 15 years. ?Fecal occult blood test (FOBT) of the stool. You may have this test every year starting at age 51. ?Flexible sigmoidoscopy or colonoscopy. You may have a sigmoidoscopy every 5 years or a  colonoscopy every 10 years starting at age 76. ?Prostate cancer screening. Recommendations will vary depending on your family history and other risks. ?Hepatitis C blood test. ?Hepatitis B blood test. ?Sexually transmitted disease (STD) testing. ?Diabetes screening. This is done by checking your blood sugar (glucose) after you have not eaten for a while (fasting). You may have this done every 1-3 years. ?Abdominal aortic aneurysm (AAA) screening. You may need this if you are a current or former smoker. ?Osteoporosis. You may be screened starting at age 72 if you are at high  risk. ?Talk with your health care provider about your test results, treatment options, and if necessary, the need for more tests. ?Vaccines  ?Your health care provider may recommend certain vaccines, such as: ?Influenza vaccine. This is recommended every year. ?Tetanus, diphtheria, and acellular pertussis (Tdap, Td) vaccine. You may need a Td booster every 10 years. ?Zoster vaccine. You may need this after age 42. ?Pneumococcal 13-valent conjugate (PCV13) vaccine. One dose is recommended after age 71. ?Pneumococcal polysaccharide (PPSV23) vaccine. One dose is recommended after age 7. ?Talk to your health care provider about which screenings and vaccines you need and how often you need them. ?This information is not intended to replace advice given to you by your health care provider. Make sure you discuss any questions you have with your health care provider. ?Document Released: 01/20/2015 Document Revised: 09/13/2015 Document Reviewed: 10/25/2014 ?Elsevier Interactive Patient Education ? 2017 [place]. ? ?Fall Prevention in the Home ?Falls can cause injuries. They can happen to people of all ages. There are many things you can do to make your home safe and to help prevent falls. ?What can I do on the outside of my home? ?Regularly fix the edges of walkways and driveways and fix any cracks. ?Remove anything that might make you trip as you walk through a door, such as a raised step or threshold. ?Trim any bushes or trees on the path to your home. ?Use bright outdoor lighting. ?Clear any walking paths of anything that might make someone trip, such as rocks or tools. ?Regularly check to see if handrails are loose or broken. Make sure that both sides of any steps have handrails. ?Any raised decks and porches should have guardrails on the edges. ?Have any leaves, snow, or ice cleared regularly. ?Use sand or salt on walking paths during winter. ?Clean up any spills in your garage right away. This includes oil or  grease spills. ?What can I do in the bathroom? ?Use night lights. ?Install grab bars by the toilet and in the tub and shower. Do not use towel bars as grab bars. ?Use non-skid mats or decals in the tub or shower. ?If you need to sit down in the shower, use a plastic, non-slip stool. ?Keep the floor dry. Clean up any water that spills on the floor as soon as it happens. ?Remove soap buildup in the tub or shower regularly. ?Attach bath mats securely with double-sided non-slip rug tape. ?Do not have throw rugs and other things on the floor that can make you trip. ?What can I do in the bedroom? ?Use night lights. ?Make sure that you have a light by your bed that is easy to reach. ?Do not use any sheets or blankets that are too big for your bed. They should not hang down onto the floor. ?Have a firm chair that has side arms. You can use this for support while you get dressed. ?Do not have throw rugs and other things  on the floor that can make you trip. ?What can I do in the kitchen? ?Clean up any spills right away. ?Avoid walking on wet floors. ?Keep items that you use a lot in easy-to-reach places. ?If you need to reach something above you, use a strong step stool that has a grab bar. ?Keep electrical cords out of the way. ?Do not use floor polish or wax that makes floors slippery. If you must use wax, use non-skid floor wax. ?Do not have throw rugs and other things on the floor that can make you trip. ?What can I do with my stairs? ?Do not leave any items on the stairs. ?Make sure that there are handrails on both sides of the stairs and use them. Fix handrails that are broken or loose. Make sure that handrails are as long as the stairways. ?Check any carpeting to make sure that it is firmly attached to the stairs. Fix any carpet that is loose or worn. ?Avoid having throw rugs at the top or bottom of the stairs. If you do have throw rugs, attach them to the floor with carpet tape. ?Make sure that you have a light switch  at the top of the stairs and the bottom of the stairs. If you do not have them, ask someone to add them for you. ?What else can I do to help prevent falls? ?Wear shoes that: ?Do not have high heels. ?Have rubber

## 2021-04-21 DIAGNOSIS — M9905 Segmental and somatic dysfunction of pelvic region: Secondary | ICD-10-CM | POA: Diagnosis not present

## 2021-04-21 DIAGNOSIS — M5030 Other cervical disc degeneration, unspecified cervical region: Secondary | ICD-10-CM | POA: Diagnosis not present

## 2021-04-21 DIAGNOSIS — M5136 Other intervertebral disc degeneration, lumbar region: Secondary | ICD-10-CM | POA: Diagnosis not present

## 2021-04-21 DIAGNOSIS — M5134 Other intervertebral disc degeneration, thoracic region: Secondary | ICD-10-CM | POA: Diagnosis not present

## 2021-04-21 DIAGNOSIS — M9902 Segmental and somatic dysfunction of thoracic region: Secondary | ICD-10-CM | POA: Diagnosis not present

## 2021-04-21 DIAGNOSIS — M9901 Segmental and somatic dysfunction of cervical region: Secondary | ICD-10-CM | POA: Diagnosis not present

## 2021-04-24 DIAGNOSIS — R300 Dysuria: Secondary | ICD-10-CM | POA: Diagnosis not present

## 2021-04-24 DIAGNOSIS — R8271 Bacteriuria: Secondary | ICD-10-CM | POA: Diagnosis not present

## 2021-04-24 DIAGNOSIS — R3915 Urgency of urination: Secondary | ICD-10-CM | POA: Diagnosis not present

## 2021-04-24 DIAGNOSIS — R350 Frequency of micturition: Secondary | ICD-10-CM | POA: Diagnosis not present

## 2021-04-25 ENCOUNTER — Telehealth: Payer: Self-pay | Admitting: Pharmacist

## 2021-04-25 NOTE — Chronic Care Management (AMB) (Signed)
? ? ?Chronic Care Management ?Pharmacy Assistant  ? ?Name: Todd [name]  MRN: [id number] DOB: [birth year] ? ?Reason for Encounter: Disease State ?  ?Conditions to be addressed/monitored: ?DMII ? ?Recent office visits:  ?04/17/21 [name], LPN - Patient presented for Medicare Annual Wellness Exam. No medication changes. ? ?03/07/21 [name], MD - Patient presented for Controlled type 2 diabetes without complication without long term current use of insulin. Decreased Semaglutide ? ?12/29/20 [name], MD - Patient presented via video for COVID 19. No medication changes. ? ?12/06/20  [name], MD - Patient presented for Controlled type 2 diabetes without complication without long term current use of insulin. Prescribed Semaglutide ? ? ?Recent consult visits:  ?04/17/21 [name] MD [name] - Patient presented for pain follow up. No medication changes. ? ?03/14/21 [name], MD [name]) - Patient presented for Diverticulitis. Stopped Metformin  ? ?02/02/21 [name], [name], MD (Cardiology) - Patient presented for Essential hypertension and other concerns. No medication changes. ? ?12/20/20 [name] MD [name] - Patient presented for Radiculopathy pain follow up. No medication changes. ? ?11/04/20 Mankiller, Riley Nearing, NP (Urgent Care) - Patient presented for Viral URI with cough and other concerns. Prescribed Tessalon ? ?Hospital visits:  ?Medication Reconciliation was completed by comparing discharge summary, patient?s EMR and Pharmacy list, and upon discussion with patient. ? ?Patient presented to [facility] ED on 01/22/21 due to Abdominal Pain.  ? ? ?New?Medications Started at [facility] Discharge:?? ?-started  ?Augmentin 875-125 mg ? ?Medication Changes at Hospital Discharge: ?-Changed  ?none ? ?Medications Discontinued at Hospital Discharge: ?-Stopped  ?none ? ?Medications that remain the same after Hospital Discharge:??  ?-All other medications will remain the  same.   ? ?Medications: ?Outpatient Encounter Medications as of 04/25/2021  ?Medication Sig  ? Coenzyme Q10 (CO Q 10 PO) Take 200 mg by mouth daily.  ? fenofibrate 160 MG tablet TAKE 1 TABLET BY MOUTH  DAILY  ? finasteride (PROSCAR) 5 MG tablet Take 5 mg by mouth daily.  ? metFORMIN (GLUCOPHAGE) 1000 MG tablet TAKE 1 TABLET BY MOUTH  TWICE DAILY WITH MEALS  ? omeprazole (PRILOSEC) 40 MG capsule 1 cap(s) orally 20 minutes before breakfast for 30 day(s  ? polycarbophil (FIBERCON) 625 MG tablet Take 625 mg by mouth daily. Taking 2 Tablet  ? rosuvastatin (CRESTOR) 10 MG tablet TAKE 1 TABLET BY MOUTH  DAILY  ? Semaglutide, 1 MG/DOSE, (OZEMPIC, 1 MG/DOSE,) 4 MG/3ML SOPN Inject 1 mg into the skin once a week.  ? vitamin B-12 (CYANOCOBALAMIN) 1000 MCG tablet Take 1,000 mcg by mouth daily.  ? VITAMIN D, ERGOCALCIFEROL, PO Take 2,000 Units by mouth daily.   ? ?No facility-administered encounter medications on file as of 04/25/2021.  ?Recent Relevant Labs: ?Lab Results  ?Component Value Date/Time  ? HGBA1C 7.7 (A) 03/07/2021 11:16 AM  ? HGBA1C 8.4 (A) 12/06/2020 02:16 PM  ? HGBA1C 7.4 (A) 08/31/2018 04:03 PM  ? HGBA1C 9.4 (H) 06/11/2018 10:55 AM  ? HGBA1C 8.5 (H) 04/29/2017 02:32 PM  ? MICROALBUR <0.7 07/18/2020 02:35 PM  ? MICROALBUR <0.7 04/29/2017 02:32 PM  ?  ?Kidney Function ?Lab Results  ?Component Value Date/Time  ? CREATININE 0.94 02/07/2020 11:35 AM  ? CREATININE 1.01 06/11/2018 10:52 AM  ? CREATININE 1.06 12/12/2015 10:15 AM  ? GFR 80.48 02/07/2020 11:35 AM  ? GFRNONAA 73 01/14/2018 11:46 AM  ? GFRAA 84 01/14/2018 11:46 AM  ? ? ?Current antihyperglycemic regimen:  ?Metformin 1000 mg  1 tablet twice daily ?Ozempic 1 MG weekly ?What recent interventions/DTPs have been made to improve glycemic control: Patient no longer on Trulicity  ?Patient has noted he has increased physical activity Goal of 10,000 steps daily ?Have there been any recent hospitalizations or ED visits since last visit with CPP? No ?Patient denies  hypoglycemic symptoms, including None ?Patient denies hyperglycemic symptoms, including none ?How often are you checking your blood sugar? Patient reports he does not check his sugars at home. ? ?Adherence Review: ?Is the patient currently on a STATIN medication? Yes ?Is the patient currently on ACE/ARB medication? No ?Does the patient have >5 day gap between last estimated fill dates? No ? ? ?Care Gaps: ?BP- 134/82 ( 03/07/21) ?AWV- 4/23 ?CCM- 7/23 (Patient wanted same day as appt with PCP) ?Lab Results  ?Component Value Date  ? HGBA1C 7.7 (A) 03/07/2021  ? ? ?Star Rating Drugs: ?Rosuvastatin (Crestor) 10 mg - Last filled 02/27/21 90 DS at [facility] ?Metformin (Glucophage) 1000 mg - Last filled 02/06/21  90 DS at [facility]  ?Ozempic 4 mg/3ML - Last filled 03/07/21 84 DS at [facility] ? ? ? ? ?[name] CMA ?Clinical Pharmacist Assistant ?[phone] ? ?

## 2021-04-28 DIAGNOSIS — M9905 Segmental and somatic dysfunction of pelvic region: Secondary | ICD-10-CM | POA: Diagnosis not present

## 2021-04-28 DIAGNOSIS — M9901 Segmental and somatic dysfunction of cervical region: Secondary | ICD-10-CM | POA: Diagnosis not present

## 2021-04-28 DIAGNOSIS — M5030 Other cervical disc degeneration, unspecified cervical region: Secondary | ICD-10-CM | POA: Diagnosis not present

## 2021-05-05 DIAGNOSIS — M9901 Segmental and somatic dysfunction of cervical region: Secondary | ICD-10-CM | POA: Diagnosis not present

## 2021-05-05 DIAGNOSIS — M9905 Segmental and somatic dysfunction of pelvic region: Secondary | ICD-10-CM | POA: Diagnosis not present

## 2021-05-10 DIAGNOSIS — M9902 Segmental and somatic dysfunction of thoracic region: Secondary | ICD-10-CM | POA: Diagnosis not present

## 2021-05-10 DIAGNOSIS — M5136 Other intervertebral disc degeneration, lumbar region: Secondary | ICD-10-CM | POA: Diagnosis not present

## 2021-05-10 DIAGNOSIS — M5030 Other cervical disc degeneration, unspecified cervical region: Secondary | ICD-10-CM | POA: Diagnosis not present

## 2021-05-10 DIAGNOSIS — M9905 Segmental and somatic dysfunction of pelvic region: Secondary | ICD-10-CM | POA: Diagnosis not present

## 2021-05-10 DIAGNOSIS — M5134 Other intervertebral disc degeneration, thoracic region: Secondary | ICD-10-CM | POA: Diagnosis not present

## 2021-05-10 DIAGNOSIS — M9901 Segmental and somatic dysfunction of cervical region: Secondary | ICD-10-CM | POA: Diagnosis not present

## 2021-05-19 DIAGNOSIS — M9901 Segmental and somatic dysfunction of cervical region: Secondary | ICD-10-CM | POA: Diagnosis not present

## 2021-05-19 DIAGNOSIS — M5030 Other cervical disc degeneration, unspecified cervical region: Secondary | ICD-10-CM | POA: Diagnosis not present

## 2021-05-19 DIAGNOSIS — M9905 Segmental and somatic dysfunction of pelvic region: Secondary | ICD-10-CM | POA: Diagnosis not present

## 2021-05-26 DIAGNOSIS — M9905 Segmental and somatic dysfunction of pelvic region: Secondary | ICD-10-CM | POA: Diagnosis not present

## 2021-05-26 DIAGNOSIS — M9901 Segmental and somatic dysfunction of cervical region: Secondary | ICD-10-CM | POA: Diagnosis not present

## 2021-05-26 DIAGNOSIS — M5030 Other cervical disc degeneration, unspecified cervical region: Secondary | ICD-10-CM | POA: Diagnosis not present

## 2021-05-28 DIAGNOSIS — M5416 Radiculopathy, lumbar region: Secondary | ICD-10-CM | POA: Diagnosis not present

## 2021-06-01 DIAGNOSIS — M9901 Segmental and somatic dysfunction of cervical region: Secondary | ICD-10-CM | POA: Diagnosis not present

## 2021-06-01 DIAGNOSIS — M9905 Segmental and somatic dysfunction of pelvic region: Secondary | ICD-10-CM | POA: Diagnosis not present

## 2021-06-01 DIAGNOSIS — M5030 Other cervical disc degeneration, unspecified cervical region: Secondary | ICD-10-CM | POA: Diagnosis not present

## 2021-06-01 DIAGNOSIS — M5136 Other intervertebral disc degeneration, lumbar region: Secondary | ICD-10-CM | POA: Diagnosis not present

## 2021-06-01 DIAGNOSIS — M5134 Other intervertebral disc degeneration, thoracic region: Secondary | ICD-10-CM | POA: Diagnosis not present

## 2021-06-01 DIAGNOSIS — M9902 Segmental and somatic dysfunction of thoracic region: Secondary | ICD-10-CM | POA: Diagnosis not present

## 2021-06-04 ENCOUNTER — Other Ambulatory Visit: Payer: Self-pay | Admitting: Urology

## 2021-06-05 DIAGNOSIS — H6123 Impacted cerumen, bilateral: Secondary | ICD-10-CM | POA: Diagnosis not present

## 2021-06-09 DIAGNOSIS — M9905 Segmental and somatic dysfunction of pelvic region: Secondary | ICD-10-CM | POA: Diagnosis not present

## 2021-06-09 DIAGNOSIS — M5030 Other cervical disc degeneration, unspecified cervical region: Secondary | ICD-10-CM | POA: Diagnosis not present

## 2021-06-09 DIAGNOSIS — M9901 Segmental and somatic dysfunction of cervical region: Secondary | ICD-10-CM | POA: Diagnosis not present

## 2021-06-13 DIAGNOSIS — R300 Dysuria: Secondary | ICD-10-CM | POA: Diagnosis not present

## 2021-06-16 DIAGNOSIS — M9901 Segmental and somatic dysfunction of cervical region: Secondary | ICD-10-CM | POA: Diagnosis not present

## 2021-06-16 DIAGNOSIS — M5030 Other cervical disc degeneration, unspecified cervical region: Secondary | ICD-10-CM | POA: Diagnosis not present

## 2021-06-16 DIAGNOSIS — M9905 Segmental and somatic dysfunction of pelvic region: Secondary | ICD-10-CM | POA: Diagnosis not present

## 2021-06-23 DIAGNOSIS — M9905 Segmental and somatic dysfunction of pelvic region: Secondary | ICD-10-CM | POA: Diagnosis not present

## 2021-06-23 DIAGNOSIS — M9901 Segmental and somatic dysfunction of cervical region: Secondary | ICD-10-CM | POA: Diagnosis not present

## 2021-06-23 DIAGNOSIS — M5030 Other cervical disc degeneration, unspecified cervical region: Secondary | ICD-10-CM | POA: Diagnosis not present

## 2021-06-30 DIAGNOSIS — M9905 Segmental and somatic dysfunction of pelvic region: Secondary | ICD-10-CM | POA: Diagnosis not present

## 2021-06-30 DIAGNOSIS — M5030 Other cervical disc degeneration, unspecified cervical region: Secondary | ICD-10-CM | POA: Diagnosis not present

## 2021-06-30 DIAGNOSIS — M9901 Segmental and somatic dysfunction of cervical region: Secondary | ICD-10-CM | POA: Diagnosis not present

## 2021-07-03 DIAGNOSIS — M5416 Radiculopathy, lumbar region: Secondary | ICD-10-CM | POA: Diagnosis not present

## 2021-07-06 DIAGNOSIS — M5136 Other intervertebral disc degeneration, lumbar region: Secondary | ICD-10-CM | POA: Diagnosis not present

## 2021-07-06 DIAGNOSIS — M9902 Segmental and somatic dysfunction of thoracic region: Secondary | ICD-10-CM | POA: Diagnosis not present

## 2021-07-06 DIAGNOSIS — M9901 Segmental and somatic dysfunction of cervical region: Secondary | ICD-10-CM | POA: Diagnosis not present

## 2021-07-06 DIAGNOSIS — M5030 Other cervical disc degeneration, unspecified cervical region: Secondary | ICD-10-CM | POA: Diagnosis not present

## 2021-07-06 DIAGNOSIS — M9905 Segmental and somatic dysfunction of pelvic region: Secondary | ICD-10-CM | POA: Diagnosis not present

## 2021-07-06 DIAGNOSIS — M5134 Other intervertebral disc degeneration, thoracic region: Secondary | ICD-10-CM | POA: Diagnosis not present

## 2021-07-09 ENCOUNTER — Ambulatory Visit: Payer: Medicare Other

## 2021-07-09 ENCOUNTER — Ambulatory Visit: Payer: Medicare Other | Admitting: Family Medicine

## 2021-07-16 ENCOUNTER — Telehealth: Payer: Self-pay | Admitting: Pharmacist

## 2021-07-16 ENCOUNTER — Other Ambulatory Visit: Payer: Self-pay | Admitting: Family Medicine

## 2021-07-16 DIAGNOSIS — E1165 Type 2 diabetes mellitus with hyperglycemia: Secondary | ICD-10-CM

## 2021-07-16 NOTE — Chronic Care Management (AMB) (Signed)
    Chronic Care Management Pharmacy Assistant   Name: Todd [name]  MRN: [id number] DOB: 16-Apr-1945  07/16/21 APPOINTMENT REMINDER   Patient is aware to have all medications, supplements and any blood glucose and blood pressure readings available for review and has checked in for office visit on 07/17/21 at 3:30 with Jeni Salles, Pharm. D.    Care Gaps: COVID Booster - Overdue Urine Micro - Overdue TDAP - Postponed AWV- 4/23 BP- 134/82 03/07/21 Lab Results  Component Value Date   HGBA1C 7.7 (A) 03/07/2021    Star Rating Drug: Rosuvastatin (Crestor) 10 mg - Last filled 06/04/21 90 DS at [facility] Metformin (Glucophage) 1000 mg - Last filled 06/04/21  90 DS at [facility] 4 mg/3ML - Last filled 06/28/21 84 DS at [facility] Verified    Medications: Outpatient Encounter Medications as of 07/16/2021  Medication Sig   Coenzyme Q10 (CO Q 10 PO) Take 200 mg by mouth daily.   fenofibrate 160 MG tablet TAKE 1 TABLET BY MOUTH  DAILY   finasteride (PROSCAR) 5 MG tablet Take 5 mg by mouth daily.   metFORMIN (GLUCOPHAGE) 1000 MG tablet TAKE 1 TABLET BY MOUTH  TWICE DAILY WITH MEALS   omeprazole (PRILOSEC) 40 MG capsule 1 cap(s) orally 20 minutes before breakfast for 30 day(s   polycarbophil (FIBERCON) 625 MG tablet Take 625 mg by mouth daily. Taking 2 Tablet   rosuvastatin (CRESTOR) 10 MG tablet TAKE 1 TABLET BY MOUTH  DAILY   Semaglutide, 1 MG/DOSE, (OZEMPIC, 1 MG/DOSE,) 4 MG/3ML SOPN Inject 1 mg into the skin once a week.   vitamin B-12 (CYANOCOBALAMIN) 1000 MCG tablet Take 1,000 mcg by mouth daily.   VITAMIN D, ERGOCALCIFEROL, PO Take 2,000 Units by mouth daily.    No facility-administered encounter medications on file as of 07/16/2021.       [place] Clinical Pharmacist Assistant [phone]

## 2021-07-17 ENCOUNTER — Encounter: Payer: Self-pay | Admitting: Family Medicine

## 2021-07-17 ENCOUNTER — Ambulatory Visit (INDEPENDENT_AMBULATORY_CARE_PROVIDER_SITE_OTHER): Payer: Medicare Other | Admitting: Family Medicine

## 2021-07-17 ENCOUNTER — Ambulatory Visit (INDEPENDENT_AMBULATORY_CARE_PROVIDER_SITE_OTHER): Payer: Medicare Other | Admitting: Pharmacist

## 2021-07-17 VITALS — BP 124/70 | HR 65 | Temp 98.1°F | Ht 74.0 in | Wt 203.5 lb

## 2021-07-17 DIAGNOSIS — E1165 Type 2 diabetes mellitus with hyperglycemia: Secondary | ICD-10-CM

## 2021-07-17 DIAGNOSIS — I10 Essential (primary) hypertension: Secondary | ICD-10-CM

## 2021-07-17 LAB — POCT GLYCOSYLATED HEMOGLOBIN (HGB A1C): Hemoglobin A1C: 7.2 % — AB (ref 4.0–5.6)

## 2021-07-17 NOTE — Progress Notes (Signed)
Chronic Care Management Pharmacy Note  07/26/2021 Name:  Todd [name] MRN:  [id number] DOB:  [birth year]  Summary: A1c is not quite at goal < 7% Pt does not check BGs at home  Recommendations/Changes made from today's visit: -Consider switching to Ozempic 2 mg pen for cost savings -Recommended for patient to check blood sugars at home to assist in lifestyle modifications -Requested testing supplies  Plan: Follow up for DM assessment in 2 months   Subjective: Todd [name] is an 76 y.o. year old male who is a primary patient of Burchette, Alinda Sierras, MD.  The CCM team was consulted for assistance with disease management and care coordination needs.    Engaged with patient by telephone for follow up visit in response to provider referral for pharmacy case management and/or care coordination services.   Consent to Services:  The patient was given information about Chronic Care Management services, agreed to services, and gave verbal consent prior to initiation of services.  Please see initial visit note for detailed documentation.   Patient Care Team: [name], MD as PCP - General [name], [name], MD as PCP - Cardiology (Cardiology) Viona Gilmore, [facility] as Pharmacist (Pharmacist)  Recent office visits: 04/17/21 [name], LPN - Patient presented for Medicare Annual Wellness Exam. No medication changes.   03/07/21 Burchette, Alinda Sierras, MD - Patient presented for Controlled type 2 diabetes without complication without long term current use of insulin. Increased Semaglutide   12/29/20 Burchette, Alinda Sierras, MD - Patient presented via video for COVID 19. No medication changes.  Recent consult visits: 06/05/21 Mariann Laster, NP (urgent care): Patient presented for ear fullness. Removed cerumen impaction.  04/17/21 Eichman MD [name] - Patient presented for pain follow up. No medication changes.   03/14/21 [name], MD [name]) - Patient presented for Diverticulitis.  Stopped Metformin.   02/02/21 [name], [name], MD (Cardiology) - Patient presented for Essential hypertension and other concerns. No medication changes.  Hospital visits: 03/28/20 Patient presented to the ED with chest pain.    Objective:  Lab Results  Component Value Date   CREATININE 0.94 02/07/2020   BUN 19 02/07/2020   GFR 80.48 02/07/2020   GFRNONAA 73 01/14/2018   GFRAA 84 01/14/2018   NA 139 02/07/2020   K 4.7 02/07/2020   CALCIUM 10.3 02/07/2020   CO2 28 02/07/2020   GLUCOSE 118 (H) 02/07/2020    Lab Results  Component Value Date/Time   HGBA1C 7.2 (A) 07/17/2021 02:25 PM   HGBA1C 7.7 (A) 03/07/2021 11:16 AM   HGBA1C 7.4 (A) 08/31/2018 04:03 PM   HGBA1C 9.4 (H) 06/11/2018 10:55 AM   HGBA1C 8.5 (H) 04/29/2017 02:32 PM   GFR 80.48 02/07/2020 11:35 AM   GFR 87.93 06/11/2018 10:52 AM   MICROALBUR <0.7 07/18/2020 02:35 PM   MICROALBUR <0.7 04/29/2017 02:32 PM    Last diabetic Eye exam:  Lab Results  Component Value Date/Time   HMDIABEYEEXA No Retinopathy 01/25/2021 12:00 AM    Last diabetic Foot exam:  Lab Results  Component Value Date/Time   HMDIABFOOTEX  normal 09/19/2014 12:00 AM     Lab Results  Component Value Date   CHOL 125 02/02/2021   HDL 33 (L) 02/02/2021   LDLCALC 69 02/02/2021   LDLDIRECT 84.0 06/11/2018   TRIG 130 02/02/2021   CHOLHDL 3.8 02/02/2021       Latest Ref Rng & Units 02/07/2020   11:35 AM 06/11/2018   10:52 AM 01/14/2018  11:46 AM  Hepatic Function  Total Protein 6.0 - 8.3 g/dL 7.7  7.5  7.2   Albumin 3.5 - 5.2 g/dL 4.7  4.5  4.5   AST 0 - 37 U/L $Remo'13  15  17   'ogTtG$ ALT 0 - 53 U/L $Remo'23  31  28   'RLoar$ Alk Phosphatase 39 - 117 U/L 53  53  60   Total Bilirubin 0.2 - 1.2 mg/dL 0.4  0.3  <0.2   Bilirubin, Direct 0.0 - 0.3 mg/dL  0.1  0.08     Lab Results  Component Value Date/Time   TSH 0.80 09/08/2013 08:41 AM   TSH 1.53 10/01/2010 08:03 AM       Latest Ref Rng & Units 02/07/2020   11:35 AM 02/26/2013   10:33 AM 04/20/2010   10:16 AM   CBC  WBC 4.0 - 10.5 K/uL 6.6  5.8  5.7   Hemoglobin 13.0 - 17.0 g/dL 14.5  14.2  14.4   Hematocrit 39.0 - 52.0 % 44.0  44.0  42.4   Platelets 150.0 - 400.0 K/uL 271.0  252.0  255.0     No results found for: "VD25OH"  Clinical ASCVD: No  The ASCVD Risk score (Arnett DK, et al., 2019) failed to calculate for the following reasons:   The valid total cholesterol range is 130 to 320 mg/dL       04/17/2021    8:48 AM 12/06/2020    2:05 PM 04/11/2020    8:52 AM  Depression screen PHQ 2/9  Decreased Interest 0 0 0  Down, Depressed, Hopeless 0 0 0  PHQ - 2 Score 0 0 0  Altered sleeping  1   Tired, decreased energy  1   Change in appetite  0   Feeling bad or failure about yourself   0   Trouble concentrating  0   Moving slowly or fidgety/restless  0   Suicidal thoughts  0   PHQ-9 Score  2   Difficult doing work/chores  Not difficult at all       Social History   Tobacco Use  Smoking Status Former   Packs/day: 2.00   Years: 10.00   Total pack years: 20.00   Types: Cigarettes   Quit date: 04/19/1977   Years since quitting: 44.2  Smokeless Tobacco Never  Tobacco Comments   discussed AAA but think he has had this   BP Readings from Last 3 Encounters:  07/17/21 124/70  03/07/21 134/82  02/02/21 132/76   Pulse Readings from Last 3 Encounters:  07/17/21 65  03/07/21 74  02/02/21 87   Wt Readings from Last 3 Encounters:  07/17/21 203 lb 8 oz (92.3 kg)  04/17/21 204 lb (92.5 kg)  03/07/21 211 lb 3.2 oz (95.8 kg)   BMI Readings from Last 3 Encounters:  07/17/21 26.13 kg/m  04/17/21 26.19 kg/m  03/07/21 27.12 kg/m    Assessment/Interventions: Review of patient past medical history, allergies, medications, health status, including review of consultants reports, laboratory and other test data, was performed as part of comprehensive evaluation and provision of chronic care management services.   SDOH:  (Social Determinants of Health) assessments and interventions  performed: No  SDOH Screenings   Alcohol Screen: Low Risk  (04/17/2021)   Alcohol Screen    Last Alcohol Screening Score (AUDIT): 0  Depression (PHQ2-9): Low Risk  (04/17/2021)   Depression (PHQ2-9)    PHQ-2 Score: 0  Financial Resource Strain: Low Risk  (04/17/2021)   Overall Financial  Resource Strain (CARDIA)    Difficulty of Paying Living Expenses: Not hard at all  Food Insecurity: No Food Insecurity (04/17/2021)   Hunger Vital Sign    Worried About Running Out of Food in the Last Year: Never true    [place] in the Last Year: Never true  Housing: Low Risk  (04/17/2021)   Housing    Last Housing Risk Score: 0  Physical Activity: Insufficiently Active (04/17/2021)   Exercise Vital Sign    Days of Exercise per Week: 7 days    Minutes of Exercise per Session: 10 min  Social Connections: Socially Integrated (04/17/2021)   Social Connection and Isolation Panel [NHANES]    Frequency of Communication with Friends and Family: More than three times a week    Frequency of Social Gatherings with Friends and Family: More than three times a week    Attends Religious Services: More than 4 times per year    Active Member of Clubs or Organizations: Yes    Attends Archivist Meetings: More than 4 times per year    Marital Status: Married  Stress: No Stress Concern Present (04/17/2021)   [place] of Stress : Not at all  Tobacco Use: Medium Risk (07/17/2021)   Patient History    Smoking Tobacco Use: Former    Smokeless Tobacco Use: Never    Passive Exposure: Not on file  Transportation Needs: No Transportation Needs (04/17/2021)   PRAPARE - Hydrologist (Medical): No    Lack of Transportation (Non-Medical): No    CCM Care Plan  Allergies  Allergen Reactions   Jardiance [Empagliflozin]     Yeast infections   Diphenhydramine-Phenylephrine Other (See Comments)    Lisinopril Swelling   Pseudoephedrine Hcl Other (See Comments)   Sudafed Pe Sinus Cong Day-Nght  [Diphenhydramine-Phenylephrine] Other (See Comments)   Triprolidine-Pse Rash   Actifed Cold-Sinus     rash   Pseudoephedrine     REACTION: hives    Medications Reviewed Today     Reviewed by Viona Gilmore, [facility] (Pharmacist) on 07/17/21 at 1509  Med List Status: <None>   Medication Order Taking? Sig Documenting Provider Last Dose Status Informant  Coenzyme Q10 (CO Q 10 PO) [id number]  Take 200 mg by mouth daily. [provider]  Active   fenofibrate 160 MG tablet [id number]  TAKE 1 TABLET BY MOUTH  DAILY Burchette, Alinda Sierras, MD  Active   finasteride (PROSCAR) 5 MG tablet [id number]  Take 5 mg by mouth daily. [provider]  Active   metFORMIN (GLUCOPHAGE) 1000 MG tablet [id number]  TAKE 1 TABLET BY MOUTH  TWICE DAILY WITH MEALS Burchette, Alinda Sierras, MD  Active   mirabegron ER (MYRBETRIQ) 50 MG TB24 tablet [id number] Yes Take 50 mg by mouth daily. [provider] Taking Active   omeprazole (PRILOSEC) 40 MG capsule [id number]  1 cap(s) orally 20 minutes before breakfast for 30 day(s [provider]  Active   polycarbophil (FIBERCON) 625 MG tablet [id number]  Take 625 mg by mouth daily. Taking 2 Tablet [provider]  Active   rosuvastatin (CRESTOR) 10 MG tablet [id number]  TAKE 1 TABLET BY MOUTH  DAILY [name], [name], MD  Active   Semaglutide, 1 MG/DOSE, (OZEMPIC, 1 MG/DOSE,) 4 MG/3ML SOPN [id number] Yes Inject 1 mg into the skin once a week. Burchette, Alinda Sierras, MD Taking Active   vitamin B-12 (  CYANOCOBALAMIN) 1000 MCG tablet [id number]  Take 1,000 mcg by mouth daily. [provider]  Active   VITAMIN D, ERGOCALCIFEROL, PO [id number]  Take 2,000 Units by mouth daily.  [provider]  Active             Patient Active Problem List   Diagnosis Date Noted   Colon cancer screening 02/07/2020   Abdominal bloating 02/07/2020   Constipation  02/07/2020   Diverticulosis of colon 03/00/9233   Periumbilical pain 00/76/2263   Personal history of colonic polyps 02/07/2020   PVC's (premature ventricular contractions) 02/07/2020   Mixed hyperlipidemia 08/26/2016   Cervical disc disorder with radiculopathy of cervical region 07/05/2013   Shoulder pain, bilateral 06/16/2013   Neck strain 10/03/2012   Chest discomfort 04/24/2012   HTN (hypertension) 02/13/2012   Right ankle pain 10/23/2011   Controlled type 2 diabetes mellitus without complication, without long-term current use of insulin ([place]) 08/08/2009   Dyslipidemia 08/08/2009   URINARY INCONTINENCE 08/08/2009    Immunization History  Administered Date(s) Administered   Fluad Quad(high Dose 65+) 08/31/2018, 10/11/2019   Influenza Split 10/08/2010, 10/14/2011   Influenza Whole 09/22/2009   Influenza, High Dose Seasonal PF 09/25/2015, 10/11/2016   Influenza,inj,Quad PF,6+ Mos 10/02/2012, 09/06/2013, 09/19/2014, 09/29/2017   Influenza-Unspecified 10/07/2020   PFIZER(Purple Top)SARS-COV-2 Vaccination 02/13/2019, 03/06/2019, 11/22/2019, 05/08/2020   PNEUMOCOCCAL CONJUGATE-20 04/20/2020   Pneumococcal Conjugate-13 12/14/2013   Pneumococcal Polysaccharide-23 10/14/2011   Tdap 11/08/2007   Zoster Recombinat (Shingrix) 12/21/2019, 03/16/2020   Zoster, Live 11/08/2007   Patient reports he has more recently lost about 20 lbs and is proud of his progress. He is working on adding in more exercise, specifically trying to do chair exercises. He has not done enough exercise recently.  Conditions to be addressed/monitored:  Hypertension, Hyperlipidemia, Diabetes, and GERD  Conditions addressed this visit: Hypertension, diabetes  Care Plan : CCM Pharmacy Care Plan  Updates made by Viona Gilmore, [place] since 07/26/2021 12:00 AM     Problem: Problem: Hypertension, Hyperlipidemia, Diabetes, and GERD      Long-Range Goal: Patient-Specific Goal   Start Date: 06/30/2020  Expected End  Date: 06/30/2021  Recent Progress: On track  Priority: High  Note:   Current Barriers:  Unable to independently monitor therapeutic efficacy Unable to achieve control of diabetes   Pharmacist Clinical Goal(s):  Patient will achieve adherence to monitoring guidelines and medication adherence to achieve therapeutic efficacy achieve control of diabetes as evidenced by A1c  through collaboration with PharmD and provider.   Interventions: 1:1 collaboration with [name], MD regarding development and update of comprehensive plan of care as evidenced by provider attestation and co-signature Inter-disciplinary care team collaboration (see longitudinal plan of care) Comprehensive medication review performed; medication list updated in electronic medical record  Hypertension (BP goal <140/90) -Controlled -Current treatment: No medications -Medications previously tried:  lisinopril (no longer needed)  -Current home readings:138-140/70 -Current dietary habits: no change -Current exercise habits: sitting most of the day -Denies hypotensive/hypertensive symptoms -Educated on Exercise goal of 150 minutes per week; Importance of home blood pressure monitoring; -Counseled to monitor BP at home when symptomatic, document, and provide log at future appointments -Counseled on diet and exercise extensively  Hyperlipidemia: (LDL goal < 70) -Controlled -Current treatment: Rosuvastatin 10 mg 1 tablet daily - Appropriate, Effective, Safe, Accessible Fenofibrate 160 mg 1 tablet daily - Appropriate, Effective, Safe, Accessible -Medications previously tried: none  -Current dietary patterns: did not discuss -Current exercise habits: sedentary most of the day -Educated  on Cholesterol goals;  Benefits of statin for ASCVD risk reduction; Importance of limiting foods high in cholesterol; Exercise goal of 150 minutes per week; -Counseled on diet and exercise extensively Recommended to continue  current medication Consider Vascepa for additional TG lowering given DM diagnosis and ASCVD risk of 25.5%  Diabetes (A1c goal <7%) -Uncontrolled -Current medications: Ozempic 1 mg inject once weekly - Appropriate, Query effective, Safe, Accessible Metformin 1000 mg 1 tablet twice daily - Appropriate, Query effective, Safe, Accessible -Medications previously tried: Geneticist, molecular (cost), Trulicity (not as effective) -Current home glucose readings fasting glucose: does not check post prandial glucose: does not check -Denies hypoglycemic/hyperglycemic symptoms -Current meal patterns:  breakfast: did not discuss  lunch: did not discuss  dinner: did not discuss snacks: did not discuss drinks: rinking 2-3 bottles of water; denies drinking soda and sweet tea -Current exercise: not exercising enough -Educated on A1c and blood sugar goals; Exercise goal of 150 minutes per week; Benefits of routine self-monitoring of blood sugar; Carbohydrate counting and/or plate method -Counseled to check feet daily and get yearly eye exams -Counseled on diet and exercise extensively Recommended to continue current medication Collaborated with PCP to prescribe glucometer  GERD (Goal: minimize symptoms) -Controlled -Current treatment  Omeprazole 20 mg 1 tablet daily as needed - Appropriate, Effective, Safe, Accessible -Medications previously tried: none  -Counseled on non-pharmacologic management of symptoms such as elevating the head of your bed, avoiding eating 2-3 hours before bed, avoiding triggering foods such as acidic, spicy, or fatty foods, eating smaller meals, and wearing clothes that are loose around the waist; recommended Tums PRN for breakthrough heartburn  Health Maintenance -Vaccine gaps: COVID boosters (patient reported he received them) -Current therapy:  Vitamin B12 1000 mcg 1 tablet daily Vitamin D 2000 units 1 tablet daily  CoQ10 200 mg 1 tablet daily Fibercon 625 mg 1 tablet daily   -Educated on Cost vs benefit of each product must be carefully weighed by individual consumer -Patient is satisfied with current therapy and denies issues -Recommended to continue current medication  Patient Goals/Self-Care Activities Patient will:  - take medications as prescribed check glucose as directed, document, and provide at future appointments target a minimum of 150 minutes of moderate intensity exercise weekly  Follow Up Plan: The care management team will reach out to the patient again over the next 30 days.        Medication Assistance:  Patient does not qualify for assistance  Compliance/Adherence/Medication fill history: Care Gaps: Tetanus, COVID booster, urine microalbumin Last A1c - 7.7% (03/07/21) BP- 134/82 03/07/21  Star-Rating Drugs: Rosuvastatin (Crestor) 10 mg - Last filled 06/04/21 90 DS at [facility] Metformin (Glucophage) 1000 mg - Last filled 06/04/21  90 DS at [place] 4 mg/3ML - Last filled 06/28/21 84 DS at [facility]  Patient's preferred pharmacy is:  [id number] - [place], [place] - [address] AT [address] [place] [address] Phone: [phone] Fax: [phone]  [facility] ([place], [place] [facility] [address] [place] [address] Phone: [phone] Fax: [phone]  [facility] ([facility] ) - [place], [place] [address] Phone: [phone] Fax: [phone]   Uses pill box? No - has own system Pt endorses 100% compliance  We discussed: Current pharmacy is preferred with insurance plan and patient is satisfied with pharmacy services Patient decided to:  Continue current medication management strategy  Care Plan and Follow Up Patient Decision:  Patient agrees to Care Plan and Follow-up.  Plan: The care management team will reach out to the patient again over the  next 30 days.  Jeni Salles, PharmD, [place] Pharmacist [place] [facility] [place]

## 2021-07-17 NOTE — Patient Instructions (Signed)
A1C improved to 7.2%.   Keep up the good work!

## 2021-07-17 NOTE — Progress Notes (Signed)
Established Patient Office Visit  Subjective   Patient ID: Todd [name], male    DOB: 05-23-1945  Age: 76 y.o. MRN: [id number]  Chief Complaint  Patient presents with   Follow-up    HPI   Furious has history of hypertension, PVCs, type 2 diabetes, hyperlipidemia.  Generally doing well.  We started Ozempic recently and he is currently 1 mg subcutaneous once weekly.  No nausea or any other side effects.  He is lost about 20 pounds and feels well overall.  Very pleased with results.  He is A1c prior to starting Ozempic was 8.4 and has steadily improved to 7.2% today.  Remains on metformin as well.  Previous intolerance with Trulicity.  Also had intolerance with Jardiance.  He has not been doing much exercise recently but plans to start back more consistent walking soon.  No recent chest pains.  Blood pressures been very stable.  He takes rosuvastatin for hyperlipidemia and had lipids in January.  He has noted, as expected, increased satiety with Ozempic which has reduced his meal sizes and also sometimes eating 2 meals per day rather than 3.  Less snacking.  Past Medical History:  Diagnosis Date   DIABETES MELLITUS, TYPE II 08/08/2009   Hiatal hernia    History of nuclear stress test    Myoview 02/2019: EF 54, apical inf and apical artifact, normal perfusion, low risk   HYPERLIPIDEMIA 08/08/2009   Palpitations    URINARY INCONTINENCE 08/08/2009   Past Surgical History:  Procedure Laterality Date   ACHILLES TENDON REPAIR  2002   rupture   APPENDECTOMY  1976   CARDIOVASCULAR STRESS TEST  11-02-2003   EF 57%   TONSILLECTOMY  1961   US ECHOCARDIOGRAPHY  02-10-2001   EF 60-65%    reports that he quit smoking about 44 years ago. His smoking use included cigarettes. He has a 20.00 pack-year smoking history. He has never used smokeless tobacco. He reports current alcohol use. He reports that he does not use drugs. family history includes Diabetes in his sister; Mental illness in his  mother. Allergies  Allergen Reactions   Jardiance [Empagliflozin]     Yeast infections   Diphenhydramine-Phenylephrine Other (See Comments)   Lisinopril Swelling   Pseudoephedrine Hcl Other (See Comments)   Sudafed Pe Sinus Cong Day-Nght  [Diphenhydramine-Phenylephrine] Other (See Comments)   Triprolidine-Pse Rash   Actifed Cold-Sinus     rash   Pseudoephedrine     REACTION: hives    Review of Systems  Respiratory:  Negative for shortness of breath.   Cardiovascular:  Negative for chest pain.  Gastrointestinal:  Negative for abdominal pain, nausea and vomiting.      Objective:     BP 124/70 (BP Location: Left Arm, Patient Position: Sitting, Cuff Size: Normal)   Pulse 65   Temp 98.1 F (36.7 C) (Oral)   Ht '6\' 2"'$  (1.88 m)   Wt 203 lb 8 oz (92.3 kg)   SpO2 98%   BMI 26.13 kg/m  BP Readings from Last 3 Encounters:  07/17/21 124/70  03/07/21 134/82  02/02/21 132/76   Wt Readings from Last 3 Encounters:  07/17/21 203 lb 8 oz (92.3 kg)  04/17/21 204 lb (92.5 kg)  03/07/21 211 lb 3.2 oz (95.8 kg)      Physical Exam Vitals reviewed.  Constitutional:      Appearance: Normal appearance.  Cardiovascular:     Rate and Rhythm: Normal rate and regular rhythm.  Pulmonary:  Effort: Pulmonary effort is normal.     Breath sounds: Normal breath sounds.  Neurological:     Mental Status: He is alert.      Results for orders placed or performed in visit on 07/17/21  POC HgB A1c  Result Value Ref Range   Hemoglobin A1C 7.2 (A) 4.0 - 5.6 %   HbA1c POC (<> result, manual entry)     HbA1c, POC (prediabetic range)     HbA1c, POC (controlled diabetic range)        The ASCVD Risk score (Arnett DK, et al., 2019) failed to calculate for the following reasons:   The valid total cholesterol range is 130 to 320 mg/dL    Assessment & Plan:   Problem List Items Addressed This Visit   None Visit Diagnoses     Type 2 diabetes mellitus with hyperglycemia, without  long-term current use of insulin ([place])    -  Primary   Relevant Orders   POC HgB A1c (Completed)     Type 2 diabetes improved with A1c 7.2%.  Continue Ozempic 1 mg subcutaneous weekly and metformin.  We did discuss the fact that Ozempic can be titrated further to 2 mg once weekly but he would like to give another 44-monthtrial on current dosage which seems reasonable.  -Continue yearly eye exam  -Check urine microalbumin at follow-up  -Establish more consistent exercise  Return in about 4 months (around 11/17/2021).    BCarolann Littler MD

## 2021-07-20 DIAGNOSIS — M9905 Segmental and somatic dysfunction of pelvic region: Secondary | ICD-10-CM | POA: Diagnosis not present

## 2021-07-20 DIAGNOSIS — M5136 Other intervertebral disc degeneration, lumbar region: Secondary | ICD-10-CM | POA: Diagnosis not present

## 2021-07-20 DIAGNOSIS — M9902 Segmental and somatic dysfunction of thoracic region: Secondary | ICD-10-CM | POA: Diagnosis not present

## 2021-07-20 DIAGNOSIS — M9901 Segmental and somatic dysfunction of cervical region: Secondary | ICD-10-CM | POA: Diagnosis not present

## 2021-07-20 DIAGNOSIS — M5030 Other cervical disc degeneration, unspecified cervical region: Secondary | ICD-10-CM | POA: Diagnosis not present

## 2021-07-20 DIAGNOSIS — M5134 Other intervertebral disc degeneration, thoracic region: Secondary | ICD-10-CM | POA: Diagnosis not present

## 2021-07-26 NOTE — Patient Instructions (Signed)
Hi Todd [name],  It was great to get to meet you in person! Below is a summary of some of the topics we discussed.    Please reach out to me if you have any questions or need anything before our follow up!  Best, Maddie  Jeni Salles, PharmD, [place] Pharmacist [place] [facility]   Visit Information   Goals Addressed   None    Patient Care Plan: CCM Pharmacy Care Plan     Problem Identified: Problem: Hypertension, Hyperlipidemia, Diabetes, and GERD      Long-Range Goal: Patient-Specific Goal   Start Date: 06/30/2020  Expected End Date: 06/30/2021  Recent Progress: On track  Priority: High  Note:   Current Barriers:  Unable to independently monitor therapeutic efficacy Unable to achieve control of diabetes   Pharmacist Clinical Goal(s):  Patient will achieve adherence to monitoring guidelines and medication adherence to achieve therapeutic efficacy achieve control of diabetes as evidenced by A1c  through collaboration with PharmD and provider.   Interventions: 1:1 collaboration with [name], MD regarding development and update of comprehensive plan of care as evidenced by provider attestation and co-signature Inter-disciplinary care team collaboration (see longitudinal plan of care) Comprehensive medication review performed; medication list updated in electronic medical record  Hypertension (BP goal <140/90) -Controlled -Current treatment: No medications -Medications previously tried:  lisinopril (no longer needed)  -Current home readings:138-140/70 -Current dietary habits: no change -Current exercise habits: sitting most of the day -Denies hypotensive/hypertensive symptoms -Educated on Exercise goal of 150 minutes per week; Importance of home blood pressure monitoring; -Counseled to monitor BP at home when symptomatic, document, and provide log at future appointments -Counseled on diet and exercise  extensively  Hyperlipidemia: (LDL goal < 70) -Controlled -Current treatment: Rosuvastatin 10 mg 1 tablet daily - Appropriate, Effective, Safe, Accessible Fenofibrate 160 mg 1 tablet daily - Appropriate, Effective, Safe, Accessible -Medications previously tried: none  -Current dietary patterns: did not discuss -Current exercise habits: sedentary most of the day -Educated on Cholesterol goals;  Benefits of statin for ASCVD risk reduction; Importance of limiting foods high in cholesterol; Exercise goal of 150 minutes per week; -Counseled on diet and exercise extensively Recommended to continue current medication Consider Vascepa for additional TG lowering given DM diagnosis and ASCVD risk of 25.5%  Diabetes (A1c goal <7%) -Uncontrolled -Current medications: Ozempic 1 mg inject once weekly - Appropriate, Query effective, Safe, Accessible Metformin 1000 mg 1 tablet twice daily - Appropriate, Query effective, Safe, Accessible -Medications previously tried: Geneticist, molecular (cost), Trulicity (not as effective) -Current home glucose readings fasting glucose: does not check post prandial glucose: does not check -Denies hypoglycemic/hyperglycemic symptoms -Current meal patterns:  breakfast: did not discuss  lunch: did not discuss  dinner: did not discuss snacks: did not discuss drinks: rinking 2-3 bottles of water; denies drinking soda and sweet tea -Current exercise: not exercising enough -Educated on A1c and blood sugar goals; Exercise goal of 150 minutes per week; Benefits of routine self-monitoring of blood sugar; Carbohydrate counting and/or plate method -Counseled to check feet daily and get yearly eye exams -Counseled on diet and exercise extensively Recommended to continue current medication Collaborated with PCP to prescribe glucometer  GERD (Goal: minimize symptoms) -Controlled -Current treatment  Omeprazole 20 mg 1 tablet daily as needed - Appropriate, Effective, Safe,  Accessible -Medications previously tried: none  -Counseled on non-pharmacologic management of symptoms such as elevating the head of your bed, avoiding eating 2-3 hours before bed, avoiding triggering foods such as  acidic, spicy, or fatty foods, eating smaller meals, and wearing clothes that are loose around the waist; recommended Tums PRN for breakthrough heartburn  Health Maintenance -Vaccine gaps: COVID boosters (patient reported he received them) -Current therapy:  Vitamin B12 1000 mcg 1 tablet daily Vitamin D 2000 units 1 tablet daily  CoQ10 200 mg 1 tablet daily Fibercon 625 mg 1 tablet daily  -Educated on Cost vs benefit of each product must be carefully weighed by individual consumer -Patient is satisfied with current therapy and denies issues -Recommended to continue current medication  Patient Goals/Self-Care Activities Patient will:  - take medications as prescribed check glucose as directed, document, and provide at future appointments target a minimum of 150 minutes of moderate intensity exercise weekly  Follow Up Plan: The care management team will reach out to the patient again over the next 30 days.         Patient verbalizes understanding of instructions and care plan provided today and agrees to view in [place]. Active MyChart status and patient understanding of how to access instructions and care plan via MyChart confirmed with patient.    The pharmacy team will reach out to the patient again over the next 30 days.   Viona Gilmore, [facility]

## 2021-07-27 ENCOUNTER — Telehealth: Payer: Self-pay

## 2021-07-27 MED ORDER — ONETOUCH ULTRASOFT LANCETS MISC: 3 refills | Status: DC

## 2021-07-27 MED ORDER — GLUCOSE BLOOD VI STRP: ORAL_STRIP | 3 refills | Status: DC

## 2021-07-27 MED ORDER — ONETOUCH VERIO W/DEVICE KIT: PACK | 1 refills | Status: DC

## 2021-07-27 NOTE — Telephone Encounter (Signed)
-----   Message from [name], MD sent at 07/27/2021  7:01 AM EDT ----- Will you send in [place] for this patient- along with testing supplies? ----- Message ----- From: Viona Gilmore, [facility] Sent: 07/26/2021   3:45 PM EDT To: [name], MD  Hi,  Would you be ok with prescribing a glucometer for Mr. Leung? He seemed ok with checking it every once in a while. Previously, it seemed like he was very against it but may have changed his mind. I believe onetouch is preferred for his insurance and he is using the mail order pharmacy.  Let me know! Maddie

## 2021-07-27 NOTE — Telephone Encounter (Signed)
Rx sent 

## 2021-07-28 DIAGNOSIS — M9901 Segmental and somatic dysfunction of cervical region: Secondary | ICD-10-CM | POA: Diagnosis not present

## 2021-07-28 DIAGNOSIS — M9905 Segmental and somatic dysfunction of pelvic region: Secondary | ICD-10-CM | POA: Diagnosis not present

## 2021-07-28 DIAGNOSIS — M5030 Other cervical disc degeneration, unspecified cervical region: Secondary | ICD-10-CM | POA: Diagnosis not present

## 2021-07-30 ENCOUNTER — Other Ambulatory Visit: Payer: Self-pay

## 2021-07-30 MED ORDER — GLUCOSE BLOOD VI STRP: ORAL_STRIP | 3 refills | Status: DC

## 2021-07-31 ENCOUNTER — Telehealth: Payer: Self-pay | Admitting: Family Medicine

## 2021-07-31 MED ORDER — ONETOUCH ULTRASOFT LANCETS MISC: 3 refills | Status: DC

## 2021-07-31 NOTE — Addendum Note (Signed)
Addended by: Nilda Riggs on: 07/31/2021 09:53 AM   Modules accepted: Orders

## 2021-07-31 NOTE — Telephone Encounter (Signed)
Spoke with Estill Bamberg from [employer] given.

## 2021-07-31 NOTE — Telephone Encounter (Signed)
Wells Guiles with [facility] is calling to confirm which is needed:  Delica Plus 30 gauge Or 33 gauge   Please advise.  [phone]  Ref #: [id number]

## 2021-08-04 DIAGNOSIS — M5030 Other cervical disc degeneration, unspecified cervical region: Secondary | ICD-10-CM | POA: Diagnosis not present

## 2021-08-04 DIAGNOSIS — M9901 Segmental and somatic dysfunction of cervical region: Secondary | ICD-10-CM | POA: Diagnosis not present

## 2021-08-04 DIAGNOSIS — M9905 Segmental and somatic dysfunction of pelvic region: Secondary | ICD-10-CM | POA: Diagnosis not present

## 2021-08-06 DIAGNOSIS — E1165 Type 2 diabetes mellitus with hyperglycemia: Secondary | ICD-10-CM

## 2021-08-06 DIAGNOSIS — I10 Essential (primary) hypertension: Secondary | ICD-10-CM | POA: Diagnosis not present

## 2021-08-11 DIAGNOSIS — M5030 Other cervical disc degeneration, unspecified cervical region: Secondary | ICD-10-CM | POA: Diagnosis not present

## 2021-08-11 DIAGNOSIS — M9905 Segmental and somatic dysfunction of pelvic region: Secondary | ICD-10-CM | POA: Diagnosis not present

## 2021-08-11 DIAGNOSIS — M9901 Segmental and somatic dysfunction of cervical region: Secondary | ICD-10-CM | POA: Diagnosis not present

## 2021-08-18 DIAGNOSIS — M9905 Segmental and somatic dysfunction of pelvic region: Secondary | ICD-10-CM | POA: Diagnosis not present

## 2021-08-18 DIAGNOSIS — M9901 Segmental and somatic dysfunction of cervical region: Secondary | ICD-10-CM | POA: Diagnosis not present

## 2021-08-23 DIAGNOSIS — M9902 Segmental and somatic dysfunction of thoracic region: Secondary | ICD-10-CM | POA: Diagnosis not present

## 2021-08-23 DIAGNOSIS — M9901 Segmental and somatic dysfunction of cervical region: Secondary | ICD-10-CM | POA: Diagnosis not present

## 2021-08-23 DIAGNOSIS — M5136 Other intervertebral disc degeneration, lumbar region: Secondary | ICD-10-CM | POA: Diagnosis not present

## 2021-08-23 DIAGNOSIS — M5030 Other cervical disc degeneration, unspecified cervical region: Secondary | ICD-10-CM | POA: Diagnosis not present

## 2021-08-23 DIAGNOSIS — M5134 Other intervertebral disc degeneration, thoracic region: Secondary | ICD-10-CM | POA: Diagnosis not present

## 2021-08-23 DIAGNOSIS — M9905 Segmental and somatic dysfunction of pelvic region: Secondary | ICD-10-CM | POA: Diagnosis not present

## 2021-08-29 ENCOUNTER — Encounter: Payer: Self-pay | Admitting: Family Medicine

## 2021-08-29 ENCOUNTER — Ambulatory Visit (INDEPENDENT_AMBULATORY_CARE_PROVIDER_SITE_OTHER): Payer: Medicare Other | Admitting: Family Medicine

## 2021-08-29 VITALS — BP 136/66 | HR 75 | Temp 98.0°F | Ht 74.0 in | Wt 204.1 lb

## 2021-08-29 DIAGNOSIS — M5416 Radiculopathy, lumbar region: Secondary | ICD-10-CM | POA: Diagnosis not present

## 2021-08-29 DIAGNOSIS — M47816 Spondylosis without myelopathy or radiculopathy, lumbar region: Secondary | ICD-10-CM | POA: Diagnosis not present

## 2021-08-29 DIAGNOSIS — K5900 Constipation, unspecified: Secondary | ICD-10-CM

## 2021-08-29 DIAGNOSIS — R251 Tremor, unspecified: Secondary | ICD-10-CM

## 2021-08-29 DIAGNOSIS — M5136 Other intervertebral disc degeneration, lumbar region: Secondary | ICD-10-CM | POA: Diagnosis not present

## 2021-08-29 NOTE — Progress Notes (Signed)
Established Patient Office Visit  Subjective   Patient ID: Todd [name], male    DOB: Jul 22, 1945  Age: 76 y.o. MRN: [id number]  Chief Complaint  Patient presents with   Health Maintenance    HPI   Mr. Hensley is seen for the following issues  Approximately 1 to 40-monthhistory of left hand tremor.  Becoming more bothersome with things like writing.  Has not noted any right hand tremor.  No head or neck tremor.  No associated weakness.  No known family history of tremor.  He is left-hand dominant.  He is specifically concerned about possible early Parkinson's disease.  He has not noted any gait changes.  No muscle rigidity.  Other issue is constipation.  He states he has been constipated most of his life but thinks Ozempic may be worsening this.  He tries to drink plenty fluids and does take occasional fiber supplement.  Has not tried any stool softeners.  No recent bloody stools.  Colonoscopy up-to-date. Not take any significant anticholinergic medications.  Past Medical History:  Diagnosis Date   DIABETES MELLITUS, TYPE II 08/08/2009   Hiatal hernia    History of nuclear stress test    Myoview 02/2019: EF 54, apical inf and apical artifact, normal perfusion, low risk   HYPERLIPIDEMIA 08/08/2009   Palpitations    URINARY INCONTINENCE 08/08/2009   Past Surgical History:  Procedure Laterality Date   ACHILLES TENDON REPAIR  2002   rupture   APPENDECTOMY  1976   CARDIOVASCULAR STRESS TEST  11-02-2003   EF 57%   TONSILLECTOMY  1961   [place]  02-10[place]2001   EF 60-65%    reports that he quit smoking about 44 years ago. His smoking use included cigarettes. He has a 20.00 pack-year smoking history. He has never used smokeless tobacco. He reports current alcohol use. He reports that he does not use drugs. family history includes Diabetes in his sister; Mental illness in his mother. Allergies  Allergen Reactions   Jardiance [Empagliflozin]     Yeast infections    Diphenhydramine-Phenylephrine Other (See Comments)   Lisinopril Swelling   Pseudoephedrine Hcl Other (See Comments)   Sudafed Pe Sinus Cong Day-Nght  [Diphenhydramine-Phenylephrine] Other (See Comments)   Triprolidine-Pse Rash   Actifed Cold-Sinus     rash   Pseudoephedrine     REACTION: hives    Review of Systems  Constitutional:  Negative for weight loss.  Respiratory:  Negative for cough.   Cardiovascular:  Negative for chest pain.  Gastrointestinal:  Positive for constipation. Negative for abdominal pain, blood in stool, melena, nausea and vomiting.  Neurological:  Positive for tremors. Negative for dizziness, speech change, focal weakness, weakness and headaches.      Objective:     BP 136/66 (BP Location: Left Arm, Patient Position: Sitting, Cuff Size: Normal)   Pulse 75   Temp 98 F (36.7 C) (Oral)   Ht '6\' 2"'$  (1.88 m)   Wt 204 lb 1.6 oz (92.6 kg)   SpO2 97%   BMI 26.20 kg/m    Physical Exam Vitals reviewed.  Constitutional:      Appearance: Normal appearance.  Abdominal:     General: There is no distension.     Palpations: Abdomen is soft.  Genitourinary:    Comments: Rectal exam reveals no impaction.  Normal anal sphincter.  No rectal masses.  Does have moderate amount of soft stool in rectal vault Musculoskeletal:     Right lower leg: No edema.  Left lower leg: No edema.  Neurological:     General: No focal deficit present.     Mental Status: He is alert.     Cranial Nerves: No cranial nerve deficit.     Motor: No weakness.     Gait: Gait normal.     Comments: Does have tremor involving left hand.  No obvious involvement of right hand.  No significant muscle rigidity.  Full strength upper extremities.  1+ reflexes throughout.  Gait normal.      No results found for any visits on 08/29/21.    The ASCVD Risk score (Arnett DK, et al., 2019) failed to calculate for the following reasons:   The valid total cholesterol range is 130 to 320 mg/dL     Assessment & Plan:   #1 tremor involving left hand.  Etiology not clear.  Patient has noted this for the past month or 2.  He is specifically concerned about Parkinson's disease.  Set up neurology referral for their opinion.  #2 constipation.  Possibly exacerbated by Ozempic. -Continue to stay well-hydrated -Rec at least 25 to 30 g fiber per day -Consider as needed use of MiraLAX -May add Senokot-S short-term if above not working   No follow-ups on file.    Carolann Littler, MD

## 2021-08-29 NOTE — Patient Instructions (Signed)
Consider OTC Miralax once daily as needed for constipation.    If above not effective, add Senokot S 1-2 daily  I am setting up neurology referral.

## 2021-08-30 ENCOUNTER — Encounter: Payer: Self-pay | Admitting: Neurology

## 2021-08-30 ENCOUNTER — Other Ambulatory Visit (HOSPITAL_COMMUNITY): Payer: Self-pay | Admitting: Neurological Surgery

## 2021-08-30 DIAGNOSIS — M5136 Other intervertebral disc degeneration, lumbar region: Secondary | ICD-10-CM

## 2021-09-01 DIAGNOSIS — M9905 Segmental and somatic dysfunction of pelvic region: Secondary | ICD-10-CM | POA: Diagnosis not present

## 2021-09-01 DIAGNOSIS — M5030 Other cervical disc degeneration, unspecified cervical region: Secondary | ICD-10-CM | POA: Diagnosis not present

## 2021-09-01 DIAGNOSIS — M9901 Segmental and somatic dysfunction of cervical region: Secondary | ICD-10-CM | POA: Diagnosis not present

## 2021-09-07 DIAGNOSIS — M9905 Segmental and somatic dysfunction of pelvic region: Secondary | ICD-10-CM | POA: Diagnosis not present

## 2021-09-07 DIAGNOSIS — M5030 Other cervical disc degeneration, unspecified cervical region: Secondary | ICD-10-CM | POA: Diagnosis not present

## 2021-09-07 DIAGNOSIS — M5134 Other intervertebral disc degeneration, thoracic region: Secondary | ICD-10-CM | POA: Diagnosis not present

## 2021-09-07 DIAGNOSIS — M9901 Segmental and somatic dysfunction of cervical region: Secondary | ICD-10-CM | POA: Diagnosis not present

## 2021-09-07 DIAGNOSIS — M9902 Segmental and somatic dysfunction of thoracic region: Secondary | ICD-10-CM | POA: Diagnosis not present

## 2021-09-07 DIAGNOSIS — M5136 Other intervertebral disc degeneration, lumbar region: Secondary | ICD-10-CM | POA: Diagnosis not present

## 2021-09-08 ENCOUNTER — Encounter: Payer: Self-pay | Admitting: Family Medicine

## 2021-09-13 MED ORDER — TIRZEPATIDE 2.5 MG/0.5ML ~~LOC~~ SOAJ: 2.5000 mg | SUBCUTANEOUS | 1 refills | Status: DC

## 2021-09-15 DIAGNOSIS — M5030 Other cervical disc degeneration, unspecified cervical region: Secondary | ICD-10-CM | POA: Diagnosis not present

## 2021-09-15 DIAGNOSIS — M9901 Segmental and somatic dysfunction of cervical region: Secondary | ICD-10-CM | POA: Diagnosis not present

## 2021-09-15 DIAGNOSIS — M9905 Segmental and somatic dysfunction of pelvic region: Secondary | ICD-10-CM | POA: Diagnosis not present

## 2021-09-26 ENCOUNTER — Encounter: Payer: Self-pay | Admitting: *Deleted

## 2021-09-26 ENCOUNTER — Ambulatory Visit (HOSPITAL_COMMUNITY)
Admission: RE | Admit: 2021-09-26 | Discharge: 2021-09-26 | Disposition: A | Discharge status: Home / Self Care | Payer: Medicare Other | Source: Ambulatory Visit | Attending: Neurological Surgery | Admitting: Neurological Surgery

## 2021-09-26 DIAGNOSIS — M5136 Other intervertebral disc degeneration, lumbar region: Secondary | ICD-10-CM | POA: Diagnosis not present

## 2021-09-26 DIAGNOSIS — M5126 Other intervertebral disc displacement, lumbar region: Secondary | ICD-10-CM | POA: Diagnosis not present

## 2021-09-26 NOTE — Progress Notes (Signed)
[facility] Quality Team Note  Name: Todd [name] Date of Birth: [birth year] MRN: [id number] Date: 09/26/2021  [facility] Quality Team has reviewed this patient's chart, please see recommendations below:  [facility] Quality Other; (Pt has open gap for KED measure.  Pt completed EGFR but needs urine albumin creatinine ratio ordered and completed to close the gap.  Pt has a future upcoming appt.)

## 2021-09-30 ENCOUNTER — Other Ambulatory Visit: Payer: Self-pay | Admitting: Family Medicine

## 2021-10-01 ENCOUNTER — Other Ambulatory Visit: Payer: Self-pay | Admitting: Family Medicine

## 2021-10-01 DIAGNOSIS — M5030 Other cervical disc degeneration, unspecified cervical region: Secondary | ICD-10-CM | POA: Diagnosis not present

## 2021-10-01 DIAGNOSIS — M9902 Segmental and somatic dysfunction of thoracic region: Secondary | ICD-10-CM | POA: Diagnosis not present

## 2021-10-01 DIAGNOSIS — M9901 Segmental and somatic dysfunction of cervical region: Secondary | ICD-10-CM | POA: Diagnosis not present

## 2021-10-01 DIAGNOSIS — M5136 Other intervertebral disc degeneration, lumbar region: Secondary | ICD-10-CM | POA: Diagnosis not present

## 2021-10-01 DIAGNOSIS — M9905 Segmental and somatic dysfunction of pelvic region: Secondary | ICD-10-CM | POA: Diagnosis not present

## 2021-10-01 DIAGNOSIS — M5134 Other intervertebral disc degeneration, thoracic region: Secondary | ICD-10-CM | POA: Diagnosis not present

## 2021-10-06 DIAGNOSIS — M5030 Other cervical disc degeneration, unspecified cervical region: Secondary | ICD-10-CM | POA: Diagnosis not present

## 2021-10-06 DIAGNOSIS — M9901 Segmental and somatic dysfunction of cervical region: Secondary | ICD-10-CM | POA: Diagnosis not present

## 2021-10-06 DIAGNOSIS — M9905 Segmental and somatic dysfunction of pelvic region: Secondary | ICD-10-CM | POA: Diagnosis not present

## 2021-10-10 ENCOUNTER — Ambulatory Visit: Payer: Medicare Other | Admitting: Neurology

## 2021-10-13 DIAGNOSIS — M9905 Segmental and somatic dysfunction of pelvic region: Secondary | ICD-10-CM | POA: Diagnosis not present

## 2021-10-13 DIAGNOSIS — M9901 Segmental and somatic dysfunction of cervical region: Secondary | ICD-10-CM | POA: Diagnosis not present

## 2021-10-13 DIAGNOSIS — M5030 Other cervical disc degeneration, unspecified cervical region: Secondary | ICD-10-CM | POA: Diagnosis not present

## 2021-10-20 DIAGNOSIS — M9901 Segmental and somatic dysfunction of cervical region: Secondary | ICD-10-CM | POA: Diagnosis not present

## 2021-10-20 DIAGNOSIS — M9905 Segmental and somatic dysfunction of pelvic region: Secondary | ICD-10-CM | POA: Diagnosis not present

## 2021-10-20 DIAGNOSIS — M5030 Other cervical disc degeneration, unspecified cervical region: Secondary | ICD-10-CM | POA: Diagnosis not present

## 2021-10-27 ENCOUNTER — Other Ambulatory Visit: Payer: Self-pay | Admitting: Family Medicine

## 2021-10-29 ENCOUNTER — Other Ambulatory Visit: Payer: Self-pay | Admitting: Family Medicine

## 2021-10-29 NOTE — Telephone Encounter (Signed)
Spoke with patient and he states that it is "very expensive, and costs more than Ozempic".  A refill is not needed at this time.  Refill was denied per patient request.

## 2021-10-31 ENCOUNTER — Encounter: Payer: Self-pay | Admitting: Family Medicine

## 2021-11-01 ENCOUNTER — Other Ambulatory Visit: Payer: Self-pay

## 2021-11-01 DIAGNOSIS — E119 Type 2 diabetes mellitus without complications: Secondary | ICD-10-CM

## 2021-11-01 MED ORDER — TIRZEPATIDE 5 MG/0.5ML ~~LOC~~ SOAJ: 5.0000 mg | SUBCUTANEOUS | 1 refills | Status: DC

## 2021-11-03 DIAGNOSIS — M9905 Segmental and somatic dysfunction of pelvic region: Secondary | ICD-10-CM | POA: Diagnosis not present

## 2021-11-03 DIAGNOSIS — M5030 Other cervical disc degeneration, unspecified cervical region: Secondary | ICD-10-CM | POA: Diagnosis not present

## 2021-11-03 DIAGNOSIS — M9901 Segmental and somatic dysfunction of cervical region: Secondary | ICD-10-CM | POA: Diagnosis not present

## 2021-11-08 ENCOUNTER — Other Ambulatory Visit: Payer: Self-pay | Admitting: Family Medicine

## 2021-11-09 NOTE — Progress Notes (Unsigned)
Assessment/Plan:   Tremor  -none seen on examination today, despite attempts with multiple techniques  -no evidence of Parkinsons Disease.  Reassurance provided today.    -we will check TSH.  Not been checked since 2015.  -let him know that if he has any new or concerning s/s or if tremor progresses, I would like to see him back.  Otherwise he will f/u prn.  Subjective:   Todd [name] was seen today in the movement disorders clinic for neurologic consultation at the request of Burchette, Alinda Sierras, MD.  The consultation is for the evaluation of left hand tremor.  Pt is L hand dominant.     Specific Symptoms:  Tremor: Yes.  ,  Left hand, 2 to 3 months, intermittent.  It is worse later in the day.  First noted when brushing teeth and trying to put toothpaste on the brush.  Writing is less legible b/c of tremor.  No micrographia.  More trouble typing.   Family hx of similar:  No. Voice: no change Sleep: has chronic insomnia  Vivid Dreams:  sometimes  Acting out dreams:  No. Wet Pillows: No. Postural symptoms:  occasionally he will lose balance but he can right himself  Falls?  No. Bradykinesia symptoms: difficulty getting out of a chair (but attributes to back issues since 2004); no shuffling; no slow movements Loss of smell:  No. Loss of taste:  No. 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 Handwriting, micrographia: No. Trouble with ADL's:  No.  Trouble buttoning clothing: No. But may have trouble with the clasp on bracelet Memory changes:  No. N/V:  No. Lightheaded:  No.  Syncope: No. Diplopia:  No. Dyskinesia:  No. Prior exposure to reglan/antipsychotics: No.  Last MRI brain was greater than 20 years ago and images are not available.  PREVIOUS MEDICATIONS: none to date  ALLERGIES:   Allergies  Allergen Reactions   Jardiance [Empagliflozin]     Yeast infections    Diphenhydramine-Phenylephrine Other (See Comments)   Lisinopril Swelling   Pseudoephedrine Hcl Other (See Comments)   Sudafed Pe Sinus Cong Day-Nght  [Diphenhydramine-Phenylephrine] Other (See Comments)   Triprolidine-Pse Rash   Actifed Cold-Sinus     rash   Pseudoephedrine     REACTION: hives    CURRENT MEDICATIONS:  Current Meds  Medication Sig   Coenzyme Q10 (CO Q 10 PO) Take 200 mg by mouth daily.   fenofibrate 160 MG tablet TAKE 1 TABLET BY MOUTH DAILY   finasteride (PROSCAR) 5 MG tablet Take 5 mg by mouth daily.   metFORMIN (GLUCOPHAGE) 1000 MG tablet TAKE 1 TABLET BY MOUTH  TWICE DAILY WITH MEALS   mirabegron ER (MYRBETRIQ) 50 MG TB24 tablet Take 50 mg by mouth daily.   omeprazole (PRILOSEC) 40 MG capsule 1 cap(s) orally 20 minutes before breakfast for 30 day(s   polycarbophil (FIBERCON) 625 MG tablet Take 625 mg by mouth daily. Taking 2 Tablet   rosuvastatin (CRESTOR) 10 MG tablet TAKE 1 TABLET BY MOUTH  DAILY   tirzepatide (MOUNJARO) 5 MG/0.5ML Pen Inject 5 mg into the skin once a week.   vitamin B-12 (CYANOCOBALAMIN) 1000 MCG tablet Take 1,000 mcg by mouth daily.   VITAMIN D, ERGOCALCIFEROL, PO Take 2,000 Units by mouth daily.      Objective:   VITALS:   Vitals:   11/13/21 1252  BP: 136/76  Pulse: 84  SpO2: 97%  Weight: 199 lb (  90.3 kg)  Height: '6\' 2"'$  (1.88 m)    GEN:  The patient appears stated age and is in NAD. HEENT:  Normocephalic, atraumatic.  The mucous membranes are moist. The superficial temporal arteries are without ropiness or tenderness. CV:  RRR Lungs:  CTAB Neck/HEME:  There are no carotid bruits bilaterally.  Neurological examination:  Orientation: The patient is alert and oriented x3.  Cranial nerves: There is good facial symmetry. Extraocular muscles are intact. The visual fields are full to confrontational testing. The speech is fluent and clear. Soft palate rises symmetrically and there is no tongue deviation. Hearing is intact to  conversational tone. Sensation: Sensation is intact to light and pinprick throughout (facial, trunk, extremities). Vibration is intact at the bilateral big toe. There is no extinction with double simultaneous stimulation. There is no sensory dermatomal level identified. Motor: Strength is 5/5 in the bilateral upper and lower extremities.   Shoulder shrug is equal and symmetric.  There is no pronator drift.   Movement examination: Tone: There is nl tone in the bilateral upper extremities.  The tone in the lower extremities is nl.  Abnormal movements: no rest tremor, even with distraction procedures.  No postural or intention tremor.  he has no difficulty with archimedes spirals.  No tremor when given a weight.  Does well with pouring water from one glass to another. Coordination:  There is no decremation with RAM's, with any form of RAMS, including alternating supination and pronation of the forearm, hand opening and closing, finger taps, heel taps and toe taps.  Gait and Station: The patient ambulates well today.  He doesn't shuffle.  I have reviewed and interpreted the following labs independently   Chemistry      Component Value Date/Time   NA 139 02/07/2020 1135   NA 136 01/14/2018 1146   K 4.7 02/07/2020 1135   CL 102 02/07/2020 1135   CO2 28 02/07/2020 1135   BUN 19 02/07/2020 1135   BUN 12 01/14/2018 1146   CREATININE 0.94 02/07/2020 1135   CREATININE 1.06 12/12/2015 1015      Component Value Date/Time   CALCIUM 10.3 02/07/2020 1135   ALKPHOS 53 02/07/2020 1135   AST 13 02/07/2020 1135   ALT 23 02/07/2020 1135   BILITOT 0.4 02/07/2020 1135   BILITOT <0.2 01/14/2018 1146      Lab Results  Component Value Date   TSH 0.80 09/08/2013   Lab Results  Component Value Date   WBC 6.6 02/07/2020   HGB 14.5 02/07/2020   HCT 44.0 02/07/2020   MCV 84.0 02/07/2020   PLT 271.0 02/07/2020     Total time spent on today's visit was 45 minutes, including both face-to-face time and  nonface-to-face time.  Time included that spent on review of records (prior notes available to me/labs/imaging if pertinent), discussing treatment and goals, answering patient's questions and coordinating care.  Cc:  [name], MD

## 2021-11-10 DIAGNOSIS — M9901 Segmental and somatic dysfunction of cervical region: Secondary | ICD-10-CM | POA: Diagnosis not present

## 2021-11-10 DIAGNOSIS — M5030 Other cervical disc degeneration, unspecified cervical region: Secondary | ICD-10-CM | POA: Diagnosis not present

## 2021-11-10 DIAGNOSIS — M9905 Segmental and somatic dysfunction of pelvic region: Secondary | ICD-10-CM | POA: Diagnosis not present

## 2021-11-13 ENCOUNTER — Other Ambulatory Visit (INDEPENDENT_AMBULATORY_CARE_PROVIDER_SITE_OTHER): Payer: Medicare Other

## 2021-11-13 ENCOUNTER — Ambulatory Visit: Payer: Medicare Other | Admitting: Neurology

## 2021-11-13 ENCOUNTER — Ambulatory Visit (INDEPENDENT_AMBULATORY_CARE_PROVIDER_SITE_OTHER): Payer: Medicare Other | Admitting: Family Medicine

## 2021-11-13 ENCOUNTER — Encounter: Payer: Self-pay | Admitting: Family Medicine

## 2021-11-13 ENCOUNTER — Encounter: Payer: Self-pay | Admitting: Neurology

## 2021-11-13 VITALS — BP 130/70 | HR 63 | Temp 97.7°F | Ht 74.0 in | Wt 199.7 lb

## 2021-11-13 VITALS — BP 136/76 | HR 84 | Ht 74.0 in | Wt 199.0 lb

## 2021-11-13 DIAGNOSIS — R251 Tremor, unspecified: Secondary | ICD-10-CM

## 2021-11-13 DIAGNOSIS — E119 Type 2 diabetes mellitus without complications: Secondary | ICD-10-CM

## 2021-11-13 LAB — TSH: TSH: 1.49 u[IU]/mL (ref 0.35–5.50)

## 2021-11-13 LAB — POCT GLYCOSYLATED HEMOGLOBIN (HGB A1C): Hemoglobin A1C: 6.8 % — AB (ref 4.0–5.6)

## 2021-11-13 NOTE — Patient Instructions (Signed)
Your provider has requested that you have labwork completed today. The lab is located on the Second floor at [address], within the [facility] office. When you get off the elevator, turn right and go in the [facility] [address]; the first brown door on the left.  Tell the ladies behind the desk that you are there for lab work. If you are not called within 15 minutes please check with the front desk.   Once you complete your labs you are free to go. You will receive a call or message via MyChart with your lab results.    

## 2021-11-13 NOTE — Patient Instructions (Signed)
Keep up the good work!    A1C has improved to 6.8%  Let's plan on three month follow up.

## 2021-11-13 NOTE — Progress Notes (Signed)
Established Patient Office Visit  Subjective   Patient ID: Todd [name], male    DOB: August 05, 1945  Age: 76 y.o. MRN: [id number]  Chief Complaint  Patient presents with   Follow-up    HPI   Todd [name] is here for medical follow-up.  History of hypertension, type 2 diabetes, dyslipidemia.  He has type 2 diabetes with history of recent poor control.  He remains on metformin and we had recently added Ozempic but he has some difficulty with coverage.  We switch to [facility] currently at 2.5 mg once weekly and tolerating well.  He is lost about 5 additional pounds since last visit and states that his weight is down about 25 pounds this year.  He is very pleased with that.  He has increased satiety, as expected, with Mounjaro.  A1c's have been steadily improving over the past year.  He had previous intolerance with Jardiance with "yeast infections ".  Vaccines all up-to-date including flu vaccine.  He recently noted somewhat subtle tremor left upper extremity.  He had requested neurology referral and has a pending appointment this afternoon.  No progressive tremor issues.  Past Medical History:  Diagnosis Date   DIABETES MELLITUS, TYPE II 08/08/2009   Hiatal hernia    History of nuclear stress test    Myoview 02/2019: EF 54, apical inf and apical artifact, normal perfusion, low risk   HYPERLIPIDEMIA 08/08/2009   Palpitations    URINARY INCONTINENCE 08/08/2009   Past Surgical History:  Procedure Laterality Date   ACHILLES TENDON REPAIR  2002   rupture   APPENDECTOMY  1976   CARDIOVASCULAR STRESS TEST  11-02-2003   EF 57%   TONSILLECTOMY  1961   US ECHOCARDIOGRAPHY  02-10-2001   EF 60-65%    reports that he quit smoking about 44 years ago. His smoking use included cigarettes. He has a 20.00 pack-year smoking history. He has never used smokeless tobacco. He reports current alcohol use. He reports that he does not use drugs. family history includes Diabetes in his sister; Mental illness in his  mother. Allergies  Allergen Reactions   Jardiance [Empagliflozin]     Yeast infections   Diphenhydramine-Phenylephrine Other (See Comments)   Lisinopril Swelling   Pseudoephedrine Hcl Other (See Comments)   Sudafed Pe Sinus Cong Day-Nght  [Diphenhydramine-Phenylephrine] Other (See Comments)   Triprolidine-Pse Rash   Actifed Cold-Sinus     rash   Pseudoephedrine     REACTION: hives    Review of Systems  Respiratory:  Negative for shortness of breath.   Cardiovascular:  Negative for chest pain.  Gastrointestinal:  Negative for abdominal pain, nausea and vomiting.      Objective:     BP 130/70 (BP Location: Left Arm, Patient Position: Sitting, Cuff Size: Normal)   Pulse 63   Temp 97.7 F (36.5 C) (Oral)   Ht '6\' 2"'$  (1.88 m)   Wt 199 lb 11.2 oz (90.6 kg)   SpO2 99%   BMI 25.64 kg/m    Physical Exam Vitals reviewed.  Constitutional:      Appearance: Normal appearance.  Cardiovascular:     Rate and Rhythm: Normal rate and regular rhythm.  Pulmonary:     Effort: Pulmonary effort is normal.     Breath sounds: Normal breath sounds. No wheezing or rales.  Musculoskeletal:     Right lower leg: No edema.     Left lower leg: No edema.  Neurological:     Mental Status: He is alert.  Results for orders placed or performed in visit on 11/13/21  POCT glycosylated hemoglobin (Hb A1C)  Result Value Ref Range   Hemoglobin A1C 6.8 (A) 4.0 - 5.6 %   HbA1c POC (<> result, manual entry)     HbA1c, POC (prediabetic range)     HbA1c, POC (controlled diabetic range)        The ASCVD Risk score (Arnett DK, et al., 2019) failed to calculate for the following reasons:   The valid total cholesterol range is 130 to 320 mg/dL    Assessment & Plan:   Problem List Items Addressed This Visit       Unprioritized   Controlled type 2 diabetes mellitus without complication, without long-term current use of insulin ([place]) - Primary   Relevant Orders   POCT glycosylated  hemoglobin (Hb A1C) (Completed)  A1c has continued to improve from over 8 a year ago to 6.8 today.  He has been steadily losing some weight on GLP-1 medication and plans to increase Mounjaro to 5 mg with next refill.  -Set up 23-monthfollow-up.  Needs urine microalbumin along with other labs including lipids and CMP at follow-up  -Continue yearly diabetic eye exam  Return in about 3 months (around 02/13/2022).    BCarolann Littler MD

## 2021-11-17 DIAGNOSIS — M9905 Segmental and somatic dysfunction of pelvic region: Secondary | ICD-10-CM | POA: Diagnosis not present

## 2021-11-17 DIAGNOSIS — M9901 Segmental and somatic dysfunction of cervical region: Secondary | ICD-10-CM | POA: Diagnosis not present

## 2021-11-17 DIAGNOSIS — M5030 Other cervical disc degeneration, unspecified cervical region: Secondary | ICD-10-CM | POA: Diagnosis not present

## 2021-11-24 DIAGNOSIS — M9905 Segmental and somatic dysfunction of pelvic region: Secondary | ICD-10-CM | POA: Diagnosis not present

## 2021-11-24 DIAGNOSIS — M9901 Segmental and somatic dysfunction of cervical region: Secondary | ICD-10-CM | POA: Diagnosis not present

## 2021-11-24 DIAGNOSIS — M5030 Other cervical disc degeneration, unspecified cervical region: Secondary | ICD-10-CM | POA: Diagnosis not present

## 2021-11-28 DIAGNOSIS — M9901 Segmental and somatic dysfunction of cervical region: Secondary | ICD-10-CM | POA: Diagnosis not present

## 2021-11-28 DIAGNOSIS — M9905 Segmental and somatic dysfunction of pelvic region: Secondary | ICD-10-CM | POA: Diagnosis not present

## 2021-11-28 DIAGNOSIS — M5030 Other cervical disc degeneration, unspecified cervical region: Secondary | ICD-10-CM | POA: Diagnosis not present

## 2021-12-03 DIAGNOSIS — M5136 Other intervertebral disc degeneration, lumbar region: Secondary | ICD-10-CM | POA: Diagnosis not present

## 2021-12-03 DIAGNOSIS — M5416 Radiculopathy, lumbar region: Secondary | ICD-10-CM | POA: Diagnosis not present

## 2021-12-05 DIAGNOSIS — M9905 Segmental and somatic dysfunction of pelvic region: Secondary | ICD-10-CM | POA: Diagnosis not present

## 2021-12-05 DIAGNOSIS — M5136 Other intervertebral disc degeneration, lumbar region: Secondary | ICD-10-CM | POA: Diagnosis not present

## 2021-12-05 DIAGNOSIS — M5134 Other intervertebral disc degeneration, thoracic region: Secondary | ICD-10-CM | POA: Diagnosis not present

## 2021-12-05 DIAGNOSIS — M9901 Segmental and somatic dysfunction of cervical region: Secondary | ICD-10-CM | POA: Diagnosis not present

## 2021-12-05 DIAGNOSIS — M5030 Other cervical disc degeneration, unspecified cervical region: Secondary | ICD-10-CM | POA: Diagnosis not present

## 2021-12-05 DIAGNOSIS — M9902 Segmental and somatic dysfunction of thoracic region: Secondary | ICD-10-CM | POA: Diagnosis not present

## 2021-12-15 DIAGNOSIS — M5030 Other cervical disc degeneration, unspecified cervical region: Secondary | ICD-10-CM | POA: Diagnosis not present

## 2021-12-15 DIAGNOSIS — M9901 Segmental and somatic dysfunction of cervical region: Secondary | ICD-10-CM | POA: Diagnosis not present

## 2021-12-15 DIAGNOSIS — M9905 Segmental and somatic dysfunction of pelvic region: Secondary | ICD-10-CM | POA: Diagnosis not present

## 2021-12-17 ENCOUNTER — Telehealth: Payer: Self-pay | Admitting: Pharmacist

## 2021-12-17 NOTE — Progress Notes (Signed)
    Chronic Care Management Pharmacy Assistant   Name: Todd [name]  MRN: [id number] DOB: 26-Jul-1945  Reason for Encounter: Disease State General Assessment   Recent office visits:  11/13/21 Todd Post, MD - Patient presented for Controlled type 2 diabetes mellitus without complication without long term current use of insulin. Plan to increase Mounjaro with next refill.  08/29/21 Burchette, Alinda Sierras, MD - Patient presented for Tremor of left hand and other concerns. No medication changes.   08/06/21  Burchette, Alinda Sierras, MD - Claims encounter for Type 2 Diabetes Mellitus. No other visit details available.   Recent consult visits:  12/03/21 Dawley DO, Todd [name] and Todd [name] - Patient presented for Radiculopathy of lumbar region. No medication changes.   11/13/21 Tat, Eustace Quail, DO (Neurology) - Patient presented for Tremor.No mediation changes.   08/29/21 Patient presented to [facility] for Radiculopathy of lumbar region. No medication changes.   Hospital visits:  None in previous 6 months  Medications: Outpatient Encounter Medications as of 12/17/2021  Medication Sig   Coenzyme Q10 (CO Q 10 PO) Take 200 mg by mouth daily.   fenofibrate 160 MG tablet TAKE 1 TABLET BY MOUTH DAILY   finasteride (PROSCAR) 5 MG tablet Take 5 mg by mouth daily.   metFORMIN (GLUCOPHAGE) 1000 MG tablet TAKE 1 TABLET BY MOUTH  TWICE DAILY WITH MEALS   mirabegron ER (MYRBETRIQ) 50 MG TB24 tablet Take 50 mg by mouth daily.   omeprazole (PRILOSEC) 40 MG capsule 1 cap(s) orally 20 minutes before breakfast for 30 day(s   polycarbophil (FIBERCON) 625 MG tablet Take 625 mg by mouth daily. Taking 2 Tablet   rosuvastatin (CRESTOR) 10 MG tablet TAKE 1 TABLET BY MOUTH  DAILY   tirzepatide (MOUNJARO) 5 MG/0.5ML Pen Inject 5 mg into the skin once a week.   vitamin B-12 (CYANOCOBALAMIN) 1000 MCG tablet Take 1,000 mcg by mouth daily.   VITAMIN D, ERGOCALCIFEROL, PO Take 2,000 Units by mouth  daily.    No facility-administered encounter medications on file as of 12/17/2021.  [name] for General Review Call   Adherence Review:  Does the Clinical Pharmacist Assistant have access to adherence rates? Yes Adherence rates for STAR metric medications   Rosuvastatin (Crestor) 10 mg - Last filled 09/12/21 90 DS at [facility]   Rosuvastatin (Crestor) 10 mg - Last filled 06/04/21 90 DS at [facility] Metformin (Glucophage) 1000 mg - Last filled 12/26/21  90 DS at [facility]  Metformin (Glucophage) 1000 mg - Last filled 06/04/21  90 DS at [facility]   Does the patient have >5 day gap between last estimated fill dates for any of the above medications or other medication gaps? Yes    Disease State Questions:  Able to connect with Patient? No  Care Gaps: TDAP - Overdue Diabetic Urine - Overdue Foot Exam - Overdue COVID Booster - Overdue BP- 130/70 11/13/21 AWV- 04/17/21 Lab Results  Component Value Date   HGBA1C 6.8 (A) 11/13/2021    Star Rating Drugs: Rosuvastatin (Crestor) 10 mg - Last filled 09/12/21 90 DS at [facility]   Metformin (Glucophage) 1000 mg - Last filled 12/26/21  90 DS at [place] [facility] Pharmacist Assistant [phone]

## 2021-12-22 DIAGNOSIS — M5030 Other cervical disc degeneration, unspecified cervical region: Secondary | ICD-10-CM | POA: Diagnosis not present

## 2021-12-22 DIAGNOSIS — M9905 Segmental and somatic dysfunction of pelvic region: Secondary | ICD-10-CM | POA: Diagnosis not present

## 2021-12-22 DIAGNOSIS — M9901 Segmental and somatic dysfunction of cervical region: Secondary | ICD-10-CM | POA: Diagnosis not present

## 2022-01-02 DIAGNOSIS — M9905 Segmental and somatic dysfunction of pelvic region: Secondary | ICD-10-CM | POA: Diagnosis not present

## 2022-01-02 DIAGNOSIS — M5134 Other intervertebral disc degeneration, thoracic region: Secondary | ICD-10-CM | POA: Diagnosis not present

## 2022-01-02 DIAGNOSIS — M9902 Segmental and somatic dysfunction of thoracic region: Secondary | ICD-10-CM | POA: Diagnosis not present

## 2022-01-02 DIAGNOSIS — M5030 Other cervical disc degeneration, unspecified cervical region: Secondary | ICD-10-CM | POA: Diagnosis not present

## 2022-01-02 DIAGNOSIS — M9901 Segmental and somatic dysfunction of cervical region: Secondary | ICD-10-CM | POA: Diagnosis not present

## 2022-01-02 DIAGNOSIS — M5136 Other intervertebral disc degeneration, lumbar region: Secondary | ICD-10-CM | POA: Diagnosis not present

## 2022-01-10 DIAGNOSIS — E119 Type 2 diabetes mellitus without complications: Secondary | ICD-10-CM | POA: Diagnosis not present

## 2022-01-10 DIAGNOSIS — H2513 Age-related nuclear cataract, bilateral: Secondary | ICD-10-CM | POA: Diagnosis not present

## 2022-01-10 DIAGNOSIS — Z7984 Long term (current) use of oral hypoglycemic drugs: Secondary | ICD-10-CM | POA: Diagnosis not present

## 2022-01-12 DIAGNOSIS — M5030 Other cervical disc degeneration, unspecified cervical region: Secondary | ICD-10-CM | POA: Diagnosis not present

## 2022-01-12 DIAGNOSIS — M9901 Segmental and somatic dysfunction of cervical region: Secondary | ICD-10-CM | POA: Diagnosis not present

## 2022-01-12 DIAGNOSIS — M9905 Segmental and somatic dysfunction of pelvic region: Secondary | ICD-10-CM | POA: Diagnosis not present

## 2022-01-22 DIAGNOSIS — J069 Acute upper respiratory infection, unspecified: Secondary | ICD-10-CM | POA: Diagnosis not present

## 2022-01-22 DIAGNOSIS — R059 Cough, unspecified: Secondary | ICD-10-CM | POA: Diagnosis not present

## 2022-01-26 DIAGNOSIS — M9901 Segmental and somatic dysfunction of cervical region: Secondary | ICD-10-CM | POA: Diagnosis not present

## 2022-01-26 DIAGNOSIS — M9905 Segmental and somatic dysfunction of pelvic region: Secondary | ICD-10-CM | POA: Diagnosis not present

## 2022-01-30 ENCOUNTER — Ambulatory Visit: Payer: Medicare Other | Admitting: Cardiovascular Disease

## 2022-02-02 DIAGNOSIS — M5030 Other cervical disc degeneration, unspecified cervical region: Secondary | ICD-10-CM | POA: Diagnosis not present

## 2022-02-02 DIAGNOSIS — M9901 Segmental and somatic dysfunction of cervical region: Secondary | ICD-10-CM | POA: Diagnosis not present

## 2022-02-02 DIAGNOSIS — M9905 Segmental and somatic dysfunction of pelvic region: Secondary | ICD-10-CM | POA: Diagnosis not present

## 2022-02-03 DIAGNOSIS — R053 Chronic cough: Secondary | ICD-10-CM | POA: Diagnosis not present

## 2022-02-03 DIAGNOSIS — J40 Bronchitis, not specified as acute or chronic: Secondary | ICD-10-CM | POA: Diagnosis not present

## 2022-02-09 DIAGNOSIS — M9905 Segmental and somatic dysfunction of pelvic region: Secondary | ICD-10-CM | POA: Diagnosis not present

## 2022-02-09 DIAGNOSIS — M9901 Segmental and somatic dysfunction of cervical region: Secondary | ICD-10-CM | POA: Diagnosis not present

## 2022-02-09 DIAGNOSIS — M5030 Other cervical disc degeneration, unspecified cervical region: Secondary | ICD-10-CM | POA: Diagnosis not present

## 2022-02-11 ENCOUNTER — Ambulatory Visit: Payer: Medicare Other | Admitting: Nurse Practitioner

## 2022-02-16 DIAGNOSIS — M9905 Segmental and somatic dysfunction of pelvic region: Secondary | ICD-10-CM | POA: Diagnosis not present

## 2022-02-16 DIAGNOSIS — M9901 Segmental and somatic dysfunction of cervical region: Secondary | ICD-10-CM | POA: Diagnosis not present

## 2022-02-19 ENCOUNTER — Other Ambulatory Visit: Payer: Self-pay | Admitting: Cardiovascular Disease

## 2022-02-19 ENCOUNTER — Telehealth: Payer: Self-pay

## 2022-02-19 NOTE — Progress Notes (Signed)
Patient ID: Todd [name], male   DOB: December 17, 1945, 77 y.o.   MRN: [id number] Care Management & Coordination Services Pharmacy Team  Reason for Encounter: Diabetes  Contacted patient to discuss diabetes disease state. Unsuccessful outreach. Left voicemail for patient to return call.  Current antihyperglycemic regimen:  Metformin 1000 mg 1 tablet twice daily  Mounjaro 5 mg once weekly     Chart Updates:  Recent office visits:  02/20/22 [name], MD - Patient presented for Controlled type 2 diabetes mellitus without complication without long term current use of insulin and other concerns. No medication changes.  Recent consult visits:  03/01/22 Grayland Jack (Cardiology) - Patient presented for Mixed HLD. No medication changes.   02/03/22 Patient presented to [facility] for Bronchitis. Xray Done. No other visit details available.  01/22/22 Patient presented to [facility] for Cough. No other visit details available.  Hospital visits:  None in previous 6 months  Medications: Outpatient Encounter Medications as of 02/19/2022  Medication Sig   Coenzyme Q10 (CO Q 10 PO) Take 200 mg by mouth daily.   fenofibrate 160 MG tablet TAKE 1 TABLET BY MOUTH DAILY   finasteride (PROSCAR) 5 MG tablet Take 5 mg by mouth daily.   metFORMIN (GLUCOPHAGE) 1000 MG tablet TAKE 1 TABLET BY MOUTH  TWICE DAILY WITH MEALS   mirabegron ER (MYRBETRIQ) 50 MG TB24 tablet Take 50 mg by mouth daily.   omeprazole (PRILOSEC) 40 MG capsule 1 cap(s) orally 20 minutes before breakfast for 30 day(s   polycarbophil (FIBERCON) 625 MG tablet Take 625 mg by mouth daily. Taking 2 Tablet   rosuvastatin (CRESTOR) 10 MG tablet TAKE 1 TABLET BY MOUTH  DAILY   tirzepatide (MOUNJARO) 5 MG/0.5ML Pen Inject 5 mg into the skin once a week.   vitamin B-12 (CYANOCOBALAMIN) 1000 MCG tablet Take 1,000 mcg by mouth daily.   VITAMIN D, ERGOCALCIFEROL, PO Take 2,000 Units by mouth daily.    No  facility-administered encounter medications on file as of 02/19/2022.    Recent Relevant Labs: Lab Results  Component Value Date/Time   HGBA1C 6.8 (A) 11/13/2021 10:56 AM   HGBA1C 7.2 (A) 07/17/2021 02:25 PM   HGBA1C 7.4 (A) 08/31/2018 04:03 PM   HGBA1C 9.4 (H) 06/11/2018 10:55 AM   HGBA1C 8.5 (H) 04/29/2017 02:32 PM   MICROALBUR <0.7 07/18/2020 02:35 PM   MICROALBUR <0.7 04/29/2017 02:32 PM    Kidney Function Lab Results  Component Value Date/Time   CREATININE 0.94 02/07/2020 11:35 AM   CREATININE 1.01 06/11/2018 10:52 AM   CREATININE 1.06 12/12/2015 10:15 AM   GFR 80.48 02/07/2020 11:35 AM   GFRNONAA 73 01/14/2018 11:46 AM   GFRAA 84 01/14/2018 11:46 AM    Star Rating Drugs:  Rosuvastatin (Crestor) 10 mg - Last filled 02/20/22 90 DS at [facility]   Metformin (Glucophage) 1000 mg - Last filled 12/26/21  90 DS at [place]: TDAP - Overdue Diabetic Urine - Overdue Foot Exam - Overdue COVID Booster - Overdue Eye Exam - Overdue AWV - 04/17/21       [name] [place] Clinical Pharmacist Assistant [phone]

## 2022-02-20 ENCOUNTER — Ambulatory Visit: Payer: Medicare Other | Admitting: Cardiovascular Disease

## 2022-02-20 ENCOUNTER — Ambulatory Visit (INDEPENDENT_AMBULATORY_CARE_PROVIDER_SITE_OTHER): Payer: Medicare Other | Admitting: Family Medicine

## 2022-02-20 ENCOUNTER — Encounter: Payer: Self-pay | Admitting: Family Medicine

## 2022-02-20 VITALS — BP 130/76 | HR 53 | Temp 98.0°F | Ht 74.0 in | Wt 200.0 lb

## 2022-02-20 DIAGNOSIS — J209 Acute bronchitis, unspecified: Secondary | ICD-10-CM

## 2022-02-20 DIAGNOSIS — I10 Essential (primary) hypertension: Secondary | ICD-10-CM | POA: Diagnosis not present

## 2022-02-20 DIAGNOSIS — E119 Type 2 diabetes mellitus without complications: Secondary | ICD-10-CM

## 2022-02-20 DIAGNOSIS — E785 Hyperlipidemia, unspecified: Secondary | ICD-10-CM | POA: Diagnosis not present

## 2022-02-20 LAB — COMPREHENSIVE METABOLIC PANEL
ALT: 19 U/L (ref 0–53)
AST: 16 U/L (ref 0–37)
Albumin: 4.4 g/dL (ref 3.5–5.2)
Alkaline Phosphatase: 37 U/L — ABNORMAL LOW (ref 39–117)
BUN: 17 mg/dL (ref 6–23)
CO2: 30 mEq/L (ref 19–32)
Calcium: 10.1 mg/dL (ref 8.4–10.5)
Chloride: 101 mEq/L (ref 96–112)
Creatinine, Ser: 1.02 mg/dL (ref 0.40–1.50)
GFR: 71.93 mL/min (ref >60.00)
Glucose, Bld: 114 mg/dL — ABNORMAL HIGH (ref 70–99)
Potassium: 4 mEq/L (ref 3.5–5.1)
Sodium: 137 mEq/L (ref 135–145)
Total Bilirubin: 0.4 mg/dL (ref 0.2–1.2)
Total Protein: 7.7 g/dL (ref 6.0–8.3)

## 2022-02-20 LAB — POCT GLYCOSYLATED HEMOGLOBIN (HGB A1C): Hemoglobin A1C: 6.7 % — AB (ref 4.0–5.6)

## 2022-02-20 LAB — LIPID PANEL
Cholesterol: 143 mg/dL (ref 0–200)
HDL: 39.4 mg/dL (ref >39.00)
LDL Cholesterol: 79 mg/dL (ref 0–99)
NonHDL: 103.71
Total CHOL/HDL Ratio: 4
Triglycerides: 126 mg/dL (ref 0.0–149.0)
VLDL: 25.2 mg/dL (ref 0.0–40.0)

## 2022-02-20 NOTE — Progress Notes (Signed)
Established Patient Office Visit  Subjective   Patient ID: Todd [name], male    DOB: December 20, 1945  Age: 77 y.o. MRN: [id number]  Chief Complaint  Patient presents with   Medical Management of Chronic Issues    HPI   Todd [name] has history of hypertension, PVCs, type 2 diabetes, dyslipidemia.  Generally doing well.  Still working full-time.  He had recent bronchial illness and was seen and started on doxycycline, Tessalon, and Flonase.  Urgent care had performed chest x-ray which showed no acute process.  Still has some lingering cough but slowly improving.  He went on [facility] currently 5 mg once weekly and has lost about 20 pounds and is very pleased with that.  Still remains on metformin.  Blood sugars have been steadily improving.  He takes rosuvastatin for hyperlipidemia.  Is due for follow-up labs.  No recent chest pains.  No dyspnea with exertion.  No significant dizziness.  Past Medical History:  Diagnosis Date   DIABETES MELLITUS, TYPE II 08/08/2009   Hiatal hernia    History of nuclear stress test    Myoview 02/2019: EF 54, apical inf and apical artifact, normal perfusion, low risk   HYPERLIPIDEMIA 08/08/2009   Palpitations    URINARY INCONTINENCE 08/08/2009   Past Surgical History:  Procedure Laterality Date   ACHILLES TENDON REPAIR  01/08/2000   rupture   APPENDECTOMY  01/07/1974   CARDIOVASCULAR STRESS TEST  11/02/2003   EF 57%   MENISCUS REPAIR Left    TONSILLECTOMY  01/08/1959   US ECHOCARDIOGRAPHY  02/10/2001   EF 60-65%    reports that he quit smoking about 44 years ago. His smoking use included cigarettes. He has a 20.00 pack-year smoking history. He has never used smokeless tobacco. He reports current alcohol use. He reports that he does not use drugs. family history includes Diabetes in his sister; Mental illness in his mother. Allergies  Allergen Reactions   Jardiance [Empagliflozin]     Yeast infections   Diphenhydramine-Phenylephrine Other (See  Comments)   Lisinopril Swelling   Pseudoephedrine Hcl Other (See Comments)   Sudafed Pe Sinus Cong Day-Nght  [Diphenhydramine-Phenylephrine] Other (See Comments)   Triprolidine-Pse Rash   Actifed Cold-Sinus     rash   Pseudoephedrine     REACTION: hives    Review of Systems  Constitutional:  Negative for chills, fever and malaise/fatigue.  Eyes:  Negative for blurred vision.  Respiratory:  Positive for cough. Negative for shortness of breath.   Cardiovascular:  Negative for chest pain.  Gastrointestinal:  Negative for abdominal pain.  Genitourinary:  Negative for dysuria.  Neurological:  Negative for dizziness, weakness and headaches.      Objective:     BP 130/76 (BP Location: Left Arm, Patient Position: Sitting, Cuff Size: Large)   Pulse (!) 53   Temp 98 F (36.7 C) (Oral)   Ht 6' 2"$  (1.88 m)   Wt 200 lb (90.7 kg)   SpO2 95%   BMI 25.68 kg/m  BP Readings from Last 3 Encounters:  02/20/22 130/76  11/13/21 136/76  11/13/21 130/70   Wt Readings from Last 3 Encounters:  02/20/22 200 lb (90.7 kg)  11/13/21 199 lb (90.3 kg)  11/13/21 199 lb 11.2 oz (90.6 kg)      Physical Exam Vitals reviewed.  Constitutional:      General: He is not in acute distress.    Appearance: Normal appearance.  Cardiovascular:     Rate and Rhythm: Normal rate and  regular rhythm.  Pulmonary:     Effort: Pulmonary effort is normal.     Breath sounds: Normal breath sounds. No wheezing or rales.  Neurological:     Mental Status: He is alert.      Results for orders placed or performed in visit on 02/20/22  POC HgB A1c  Result Value Ref Range   Hemoglobin A1C 6.7 (A) 4.0 - 5.6 %   HbA1c POC (<> result, manual entry)     HbA1c, POC (prediabetic range)     HbA1c, POC (controlled diabetic range)        The ASCVD Risk score (Arnett DK, et al., 2019) failed to calculate for the following reasons:   The valid total cholesterol range is 130 to 320 mg/dL    Assessment & Plan:    #1 type 2 diabetes controlled with A1c 6.7%.  Continue Mounjaro and metformin.  Continue yearly diabetic eye exam.  Recheck 6 months  #2 dyslipidemia treated with fenofibrate and Crestor.  Check CMP and lipid panel.  #3 recent cough.  Suspect acute viral bronchitis.  Chest x-ray unremarkable.  Lung exam unremarkable today.  He is aware cough may linger for another couple weeks.  If not resolving in the next few weeks be in touch   Return in about 6 months (around 08/21/2022).    Carolann Littler, MD

## 2022-02-23 DIAGNOSIS — M9905 Segmental and somatic dysfunction of pelvic region: Secondary | ICD-10-CM | POA: Diagnosis not present

## 2022-02-23 DIAGNOSIS — M9901 Segmental and somatic dysfunction of cervical region: Secondary | ICD-10-CM | POA: Diagnosis not present

## 2022-02-23 DIAGNOSIS — M5030 Other cervical disc degeneration, unspecified cervical region: Secondary | ICD-10-CM | POA: Diagnosis not present

## 2022-02-25 ENCOUNTER — Other Ambulatory Visit: Payer: Self-pay | Admitting: Family Medicine

## 2022-02-28 NOTE — Progress Notes (Signed)
Cardiology Office Note   Date:  03/01/2022   ID:  Todd [name], Todd [name] 1945/10/26, MRN [id number]  PCP:  [name], MD  Cardiologist:   Mertie Moores, MD   Chief Complaint  Patient presents with   Hyperlipidemia   Hypertension        1. Hypercholesterolemia 2. Palpitations 3. History of chest pain- normal stress Myoview study in October, 2005 4. Diabetes mellitus 5. Hypertension 6. Atypical chest pain:    Todd [name] is a 77 year old gentleman with a history as noted above.   He is remaining quite active. He is fairly healthy. He's been exercising on a fairly regular basis.  He denies episodes of chest pain or shortness of breath.  Feb. 6, 2014: Todd [name] is doing well.  He's not had any episodes of chest pain or shortness breath. He still exercising some but not as much as he would like to.  He gained a little bit of weight on a cruise this past fall and still working on getting that weight off.  April, 18, 2014:  He feels ok but has had an unusual sensation across his chest.   It is a similar sensation that he gets in his arms with the pravachol.  He has decreased his dose and feels better. The sensation is not related to exercise, taking a deep breath, eating or drinking. It is also not related to twisting or turning of his torso..  the sensation is an off and on sensation. to last for a couple of hours.  Nov. 3, 2014:  Todd [name] is doing well.  Exercising sporadically .    Sept. 3, 2015:  Todd [name] is doing ok.  Not exercising as much. 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    Has gained some weight - lack of exercise,  Has not changed his diet.        May 04, 2014:   Todd [name] is a 77 y.o. male who presents for follow up of his HTN and hyperlipidemia.   He's been having some episodes of CP recently.  Worse with deep breath. More fatigued for the past couple of weeks  .  Sleeping well.  Wants to get back into an exercise  regimin.   Nov. 8, 2016:  Doing well.  Fairly active.  Walks on occasion.    Dec. 5, 2017:  Todd [name] is doing well. Had a CT of the abdomen that mentioned possible coronary artery calcifications.  Had a low risk myoview in July, 2017. Lipids in Sept. 2016.  are ok.   Trigs = 217, LDL was 94 Has a physical coming up in a week or so   No CP.   Still very active .   Limited by his back pain .   Aug. 20, 2018:  Todd [name] is seen back today  For further evaluation of his hyperlipidemia and coronary calcifications. He had a Myoview study in July, 2017 which was low risk.  Has gotten married since I last saw him .  No CP or dyspnea. Not as much exercise as he would like Has some back issues.   November 27, 2016: Todd [name] is seen today with his new wife, Grigas.  Has had some heart burn recently .   Occurs spontaneously.   Not associated with exertion  No Cp or dyspnea with any exertion   Resolved with Prilosec.    Not getting enough exercise,   Still traveling  July 03, 2017 :  Todd [name] is seen today  No CP , no dyspnea.    Still working .  Will be moving to [place]  in several weeks.  January 8, [id number]: Todd [name] seen today for follow-up visit.  He has a history of hypertension and hyperlipidemia. Has moved to [facility] since I last saw him. No CP or dyspnea.  Has not been exercising as much .   Jan. 31, 2022 Todd [name] is seen today for follow up visit for his HTN and HLD. Has moved to [place], [place]  Has DM.  Is not exercising much .  No CP , dyspnea.   Had lipids drawn this am at Dr. Erick Blinks office   Jan. 27, 2023; Todd [name] is seen today for follow up visit  for his HTN and HLD.  Has moved to [place], [place]  No CP , no dyspnea Not exercising as much ,    Has back issues that slow him down .   He recently had an episode of diverticulitis and wound up in the ER in [place], [place] [facility].  Comprehensive metabolic profile there showed normal liver enzymes.  Sodium was 140, potassium  is 4.0.  Creatinine 0.99.  Glucose was 158 (nonfasting)   Feb. 23, 2024 Todd [name] is seen today for follow up of his HTN, HLD  Has moved to [place], [place]   Exercising intermittently   No CP , no dyspnea  Labs from Dr. Erick Blinks office from February 20, 2022 look good.  His LDL is 79, triglyceride level is 126, cholesterol is 143, HDL is 39.4.   Past Medical History:  Diagnosis Date   DIABETES MELLITUS, TYPE II 08/08/2009   Hiatal hernia    History of nuclear stress test    Myoview 02/2019: EF 54, apical inf and apical artifact, normal perfusion, low risk   HYPERLIPIDEMIA 08/08/2009   Palpitations    URINARY INCONTINENCE 08/08/2009    Past Surgical History:  Procedure Laterality Date   ACHILLES TENDON REPAIR  01/08/2000   rupture   APPENDECTOMY  01/07/1974   CARDIOVASCULAR STRESS TEST  11/02/2003   EF 57%   MENISCUS REPAIR Left    TONSILLECTOMY  01/08/1959   US ECHOCARDIOGRAPHY  02/10/2001   EF 60-65%     Current Outpatient Medications  Medication Sig Dispense Refill   Cholecalciferol (VITAMIN D-3 PO) Take 1 tablet by mouth daily in the afternoon.     Coenzyme Q10 (CO Q 10 PO) Take 200 mg by mouth daily.     fenofibrate 160 MG tablet TAKE 1 TABLET BY MOUTH DAILY 90 tablet 3   finasteride (PROSCAR) 5 MG tablet Take 5 mg by mouth daily.     metFORMIN (GLUCOPHAGE) 1000 MG tablet TAKE 1 TABLET BY MOUTH TWICE  DAILY WITH MEALS 180 tablet 1   mirabegron ER (MYRBETRIQ) 50 MG TB24 tablet Take 50 mg by mouth daily.     omeprazole (PRILOSEC) 40 MG capsule 1 cap(s) orally 20 minutes before breakfast for 30 day(s     polycarbophil (FIBERCON) 625 MG tablet Take 625 mg by mouth daily. Taking 2 Tablet     rosuvastatin (CRESTOR) 10 MG tablet TAKE 1 TABLET BY MOUTH DAILY 90 tablet 3   tirzepatide (MOUNJARO) 5 MG/0.5ML Pen Inject 5 mg into the skin once a week. 6 mL 1   vitamin B-12 (CYANOCOBALAMIN) 1000 MCG tablet Take 1,000 mcg by mouth daily.     VITAMIN D, ERGOCALCIFEROL, PO Take 2,000 Units  by mouth daily.  No current facility-administered medications for this visit.    Allergies:   Jardiance [empagliflozin], Diphenhydramine-phenylephrine, Lisinopril, Pseudoephedrine hcl, Sudafed pe sinus cong day-nght  [diphenhydramine-phenylephrine], Triprolidine-pse, Actifed cold-sinus, Pseudoephedrine, and Triprolidine hcl    Social History:  The patient  reports that he quit smoking about 44 years ago. His smoking use included cigarettes. He has a 20.00 pack-year smoking history. He has never used smokeless tobacco. He reports current alcohol use. He reports that he does not use drugs.   Family History:  The patient's family history includes Diabetes in his sister; Mental illness in his mother.    ROS: Noted in current history, otherwise review of systems is negative.   Physical Exam: Blood pressure 124/80, pulse 74, height '6\' 2"'$  (1.88 m), weight 203 lb (92.1 kg), SpO2 96 %.       GEN:  Well nourished, well developed in no acute distress HEENT: Normal NECK: No JVD; No carotid bruits LYMPHATICS: No lymphadenopathy CARDIAC: RRR , no murmurs, rubs, gallops RESPIRATORY:  Clear to auscultation without rales, wheezing or rhonchi  ABDOMEN: Soft, non-tender, non-distended MUSCULOSKELETAL:  No edema; No deformity  SKIN: Warm and dry NEUROLOGIC:  Alert and oriented x 3   EKG:    March 01, 2022: Normal sinus rhythm at 74.  Occasional premature atrial contractions.  Moderate voltage criteria for LVH.  Recent Labs: 11/13/2021: TSH 1.49 02/20/2022: ALT 19; BUN 17; Creatinine, Ser 1.02; Potassium 4.0; Sodium 137    Lipid Panel    Component Value Date/Time   CHOL 143 02/20/2022 1044   CHOL 125 02/02/2021 1121   TRIG 126.0 02/20/2022 1044   HDL 39.40 02/20/2022 1044   HDL 33 (L) 02/02/2021 1121   CHOLHDL 4 02/20/2022 1044   VLDL 25.2 02/20/2022 1044   LDLCALC 79 02/20/2022 1044   LDLCALC 69 02/02/2021 1121   LDLDIRECT 84.0 06/11/2018 1052      Wt Readings from Last 3  Encounters:  03/01/22 203 lb (92.1 kg)  02/20/22 200 lb (90.7 kg)  11/13/21 199 lb (90.3 kg)      Other studies Reviewed: Additional studies/ records that were reviewed today include: . Review of the above records demonstrates:    ASSESSMENT AND PLAN:  1. Hypercholesterolemia-     lipid levels look good.  Continue rosuvastatin.  Continue fenofibrate.  His weight loss is certainly helping with his lipid levels.   2. PVCs    3. History of chest pain-      4. Diabetes mellitus - managed by primary MD    5. Hypertension-blood pressure is very well-controlled.  He is lost weight.  He is no longer on any blood pressure medications.        Current medicines are reviewed at length with the patient today.  The patient does not have concerns regarding medicines.  The following changes have been made:  no change  Labs/ tests ordered today include:   Orders Placed This Encounter  Procedures   EKG 12-Lead     Disposition:   FU with and APP or me in 1 year    Signed, Mertie Moores, MD  03/01/2022 4:32 PM    [facility] [address], [place], [place]  [address] Phone: [phone]; Fax: [phone]

## 2022-03-01 ENCOUNTER — Ambulatory Visit: Payer: Medicare Other | Attending: Cardiovascular Disease | Admitting: Cardiovascular Disease

## 2022-03-01 ENCOUNTER — Encounter: Payer: Self-pay | Admitting: Cardiovascular Disease

## 2022-03-01 VITALS — BP 124/80 | HR 74 | Ht 74.0 in | Wt 203.0 lb

## 2022-03-01 DIAGNOSIS — E782 Mixed hyperlipidemia: Secondary | ICD-10-CM | POA: Diagnosis not present

## 2022-03-01 NOTE — Patient Instructions (Signed)
Medication Instructions:  Your physician recommends that you continue on your current medications as directed. Please refer to the Current Medication list given to you today.  *If you need a refill on your cardiac medications before your next appointment, please call your pharmacy*   Lab Work: NONE If you have labs (blood work) drawn today and your tests are completely normal, you will receive your results only by: [place] (if you have MyChart) OR A paper copy in the mail If you have any lab test that is abnormal or we need to change your treatment, we will call you to review the results.   Testing/Procedures: NONE   Follow-Up: At [facility], you and your health needs are our priority.  As part of our continuing mission to provide you with exceptional heart care, we have created designated Provider Care Teams.  These Care Teams include your primary Cardiologist (physician) and Advanced Practice Providers (APPs -  Physician Assistants and Nurse Practitioners) who all work together to provide you with the care you need, when you need it.  We recommend signing up for the patient portal called "MyChart".  Sign up information is provided on this After Visit Summary.  MyChart is used to connect with patients for Virtual Visits (Telemedicine).  Patients are able to view lab/test results, encounter notes, upcoming appointments, etc.  Non-urgent messages can be sent to your provider as well.   To learn more about what you can do with MyChart, go to [email or web address].    Your next appointment:   1 year(s)  Provider:   Mertie Moores, MD

## 2022-03-02 DIAGNOSIS — M9905 Segmental and somatic dysfunction of pelvic region: Secondary | ICD-10-CM | POA: Diagnosis not present

## 2022-03-02 DIAGNOSIS — M9901 Segmental and somatic dysfunction of cervical region: Secondary | ICD-10-CM | POA: Diagnosis not present

## 2022-03-08 DIAGNOSIS — M5416 Radiculopathy, lumbar region: Secondary | ICD-10-CM | POA: Diagnosis not present

## 2022-03-09 DIAGNOSIS — M9901 Segmental and somatic dysfunction of cervical region: Secondary | ICD-10-CM | POA: Diagnosis not present

## 2022-03-09 DIAGNOSIS — M9905 Segmental and somatic dysfunction of pelvic region: Secondary | ICD-10-CM | POA: Diagnosis not present

## 2022-03-16 DIAGNOSIS — M9905 Segmental and somatic dysfunction of pelvic region: Secondary | ICD-10-CM | POA: Diagnosis not present

## 2022-03-16 DIAGNOSIS — M5030 Other cervical disc degeneration, unspecified cervical region: Secondary | ICD-10-CM | POA: Diagnosis not present

## 2022-03-16 DIAGNOSIS — M9901 Segmental and somatic dysfunction of cervical region: Secondary | ICD-10-CM | POA: Diagnosis not present

## 2022-03-19 ENCOUNTER — Encounter: Payer: Self-pay | Admitting: Family Medicine

## 2022-03-19 DIAGNOSIS — R051 Acute cough: Secondary | ICD-10-CM | POA: Diagnosis not present

## 2022-03-23 DIAGNOSIS — M9901 Segmental and somatic dysfunction of cervical region: Secondary | ICD-10-CM | POA: Diagnosis not present

## 2022-03-23 DIAGNOSIS — M9905 Segmental and somatic dysfunction of pelvic region: Secondary | ICD-10-CM | POA: Diagnosis not present

## 2022-03-30 DIAGNOSIS — M9905 Segmental and somatic dysfunction of pelvic region: Secondary | ICD-10-CM | POA: Diagnosis not present

## 2022-03-30 DIAGNOSIS — M9901 Segmental and somatic dysfunction of cervical region: Secondary | ICD-10-CM | POA: Diagnosis not present

## 2022-03-30 DIAGNOSIS — M5030 Other cervical disc degeneration, unspecified cervical region: Secondary | ICD-10-CM | POA: Diagnosis not present

## 2022-04-02 ENCOUNTER — Ambulatory Visit (INDEPENDENT_AMBULATORY_CARE_PROVIDER_SITE_OTHER): Payer: Medicare Other | Admitting: Adult Health

## 2022-04-02 ENCOUNTER — Encounter: Payer: Self-pay | Admitting: Adult Health

## 2022-04-02 VITALS — BP 122/84 | HR 75 | Temp 98.9°F | Ht 74.0 in | Wt 193.2 lb

## 2022-04-02 DIAGNOSIS — J209 Acute bronchitis, unspecified: Secondary | ICD-10-CM | POA: Diagnosis not present

## 2022-04-02 NOTE — Progress Notes (Signed)
Subjective:    Patient ID: Todd [name], male    DOB: Aug 08, 1945, 77 y.o.   MRN: [id number]  HPI 76 year old male who  has a past medical history of DIABETES MELLITUS, TYPE II (08/08/2009), Hiatal hernia, History of nuclear stress test, HYPERLIPIDEMIA (08/08/2009), Palpitations, and URINARY INCONTINENCE (08/08/2009).  He presents to the office today for follow-up regarding a dry cough.  When his symptoms started in January he initially had a sore throat and was coughing up phlegm.  He was seen at urgent care in [facility].  At this time he did have a chest x-ray that was negative.  He was treated with bronchitis with a course of doxycycline, Tessalon Perles, and ProAir.  His symptoms improved but 2 weeks ago developed a dry cough and sore throat with fatigue.  He returned to urgent care and was treated with a steroid burst.  He did have COVID and flu testing done which were negative.  Reports that he continues to have a dry cough and feel fatigued.  No fevers or chills. Review of Systems See HPI   Past Medical History:  Diagnosis Date   DIABETES MELLITUS, TYPE II 08/08/2009   Hiatal hernia    History of nuclear stress test    Myoview 02/2019: EF 54, apical inf and apical artifact, normal perfusion, low risk   HYPERLIPIDEMIA 08/08/2009   Palpitations    URINARY INCONTINENCE 08/08/2009    Social History   Socioeconomic History   Marital status: Married    Spouse name: Not on file   Number of children: Not on file   Years of education: Not on file   Highest education level: Bachelor's degree (e.g., BA, AB, BS)  Occupational History   Occupation: self employed    Comment: consulting  Tobacco Use   Smoking status: Former    Packs/day: 2.00    Years: 10.00    Additional pack years: 0.00    Total pack years: 20.00    Types: Cigarettes    Quit date: 04/19/1977    Years since quitting: 44.9   Smokeless tobacco: Never   Tobacco comments:    discussed AAA but think he has had  this  Vaping Use   Vaping Use: Never used  Substance and Sexual Activity   Alcohol use: Yes    Comment: 1 glass wine/month   Drug use: No   Sexual activity: Not on file  Other Topics Concern   Not on file  Social History Narrative   Left handed   Self em[ployed   Social Determinants of Health   Financial Resource Strain: Low Risk  (02/18/2022)   Overall Financial Resource Strain (CARDIA)    Difficulty of Paying Living Expenses: Not hard at all  Food Insecurity: No Food Insecurity (02/18/2022)   Hunger Vital Sign    Worried About Running Out of Food in the Last Year: Never true    Ran Out of Food in the Last Year: Never true  Transportation Needs: No Transportation Needs (02/18/2022)   PRAPARE - Hydrologist (Medical): No    Lack of Transportation (Non-Medical): No  Physical Activity: Insufficiently Active (02/18/2022)   Exercise Vital Sign    Days of Exercise per Week: 1 day    Minutes of Exercise per Session: 60 min  Stress: No Stress Concern Present (02/18/2022)   [place]    Feeling of Stress : Only a little  Social Connections: Socially Integrated (02/18/2022)   Social Connection and Isolation Panel [NHANES]    Frequency of Communication with Friends and Family: Twice a week    Frequency of Social Gatherings with Friends and Family: Once a week    Attends Religious Services: More than 4 times per year    Active Member of [employer] or Organizations: No    Attends Music therapist: More than 4 times per year    Marital Status: Married  Human resources officer Violence: Not At Risk (04/17/2021)   Humiliation, Afraid, Rape, and Kick questionnaire    Fear of Current or Ex-Partner: No    Emotionally Abused: No    Physically Abused: No    Sexually Abused: No    Past Surgical History:  Procedure Laterality Date   ACHILLES TENDON REPAIR  01/08/2000   rupture   APPENDECTOMY   01/07/1974   CARDIOVASCULAR STRESS TEST  11/02/2003   EF 57%   MENISCUS REPAIR Left    TONSILLECTOMY  01/08/1959   US ECHOCARDIOGRAPHY  02/10/2001   EF 60-65%    Family History  Problem Relation Age of Onset   Mental illness Mother    Diabetes Sister        type ll    Allergies  Allergen Reactions   Jardiance [Empagliflozin]     Yeast infections   Diphenhydramine-Phenylephrine Other (See Comments)   Lisinopril Swelling   Pseudoephedrine Hcl Other (See Comments)   Sudafed Pe Sinus Cong Day-Nght  [Diphenhydramine-Phenylephrine] Other (See Comments)   Triprolidine-Pse Rash   Actifed Cold-Sinus     rash   Pseudoephedrine     REACTION: hives   Triprolidine Hcl     Current Outpatient Medications on File Prior to Visit  Medication Sig Dispense Refill   Cholecalciferol (VITAMIN D-3 PO) Take 1 tablet by mouth daily in the afternoon.     Coenzyme Q10 (CO Q 10 PO) Take 200 mg by mouth daily.     fenofibrate 160 MG tablet TAKE 1 TABLET BY MOUTH DAILY 90 tablet 3   finasteride (PROSCAR) 5 MG tablet Take 5 mg by mouth daily.     metFORMIN (GLUCOPHAGE) 1000 MG tablet TAKE 1 TABLET BY MOUTH TWICE  DAILY WITH MEALS 180 tablet 1   mirabegron ER (MYRBETRIQ) 50 MG TB24 tablet Take 50 mg by mouth daily.     omeprazole (PRILOSEC) 40 MG capsule 1 cap(s) orally 20 minutes before breakfast for 30 day(s     polycarbophil (FIBERCON) 625 MG tablet Take 625 mg by mouth daily. Taking 2 Tablet     rosuvastatin (CRESTOR) 10 MG tablet TAKE 1 TABLET BY MOUTH DAILY 90 tablet 3   tirzepatide (MOUNJARO) 5 MG/0.5ML Pen Inject 5 mg into the skin once a week. 6 mL 1   vitamin B-12 (CYANOCOBALAMIN) 1000 MCG tablet Take 1,000 mcg by mouth daily.     VITAMIN D, ERGOCALCIFEROL, PO Take 2,000 Units by mouth daily.      No current facility-administered medications on file prior to visit.    BP 122/84 (BP Location: Left Arm, Patient Position: Sitting, Cuff Size: Normal)   Pulse 75   Temp 98.9 F (37.2 C)  (Oral)   Ht 6\' 2"  (1.88 m)   Wt 193 lb 3.2 oz (87.6 kg)   SpO2 97%   BMI 24.81 kg/m       Objective:   Physical Exam Vitals and nursing note reviewed.  Constitutional:      Appearance: Normal appearance.  Cardiovascular:  Rate and Rhythm: Normal rate and regular rhythm.     Heart sounds: Normal heart sounds.  Pulmonary:     Effort: Pulmonary effort is normal. No respiratory distress.     Breath sounds: No stridor. Wheezing (trace expiratoyr wheezing that resiolved with choughing) present. No rhonchi or rales.  Musculoskeletal:        General: Normal range of motion.  Skin:    General: Skin is warm and dry.  Neurological:     General: No focal deficit present.     Mental Status: He is alert and oriented to person, place, and time.  Psychiatric:        Mood and Affect: Mood normal.        Behavior: Behavior normal.        Thought Content: Thought content normal.        Judgment: Judgment normal.       Assessment & Plan:  1. Acute bronchitis, unspecified organism - Bronchitis vs post viral cough  - Will give sample of Symbicort 160/4.5 mcg  Samples of this drug were given to the patient, quantity , Lot Number [id number] - No signs of pneumonia  - Follow up as needed  Dorothyann Peng, NP  - Despina Hick

## 2022-04-04 ENCOUNTER — Other Ambulatory Visit: Payer: Self-pay | Admitting: Family Medicine

## 2022-04-04 DIAGNOSIS — E119 Type 2 diabetes mellitus without complications: Secondary | ICD-10-CM

## 2022-04-06 DIAGNOSIS — M5030 Other cervical disc degeneration, unspecified cervical region: Secondary | ICD-10-CM | POA: Diagnosis not present

## 2022-04-06 DIAGNOSIS — M9905 Segmental and somatic dysfunction of pelvic region: Secondary | ICD-10-CM | POA: Diagnosis not present

## 2022-04-06 DIAGNOSIS — M9901 Segmental and somatic dysfunction of cervical region: Secondary | ICD-10-CM | POA: Diagnosis not present

## 2022-04-09 ENCOUNTER — Encounter: Payer: Self-pay | Admitting: Adult Health

## 2022-04-13 DIAGNOSIS — M9905 Segmental and somatic dysfunction of pelvic region: Secondary | ICD-10-CM | POA: Diagnosis not present

## 2022-04-13 DIAGNOSIS — M9901 Segmental and somatic dysfunction of cervical region: Secondary | ICD-10-CM | POA: Diagnosis not present

## 2022-04-15 ENCOUNTER — Other Ambulatory Visit: Payer: Self-pay | Admitting: Urology

## 2022-04-19 ENCOUNTER — Ambulatory Visit (INDEPENDENT_AMBULATORY_CARE_PROVIDER_SITE_OTHER): Payer: Medicare Other | Admitting: Adult Health

## 2022-04-19 ENCOUNTER — Encounter: Payer: Self-pay | Admitting: Adult Health

## 2022-04-19 VITALS — BP 130/70 | HR 88 | Temp 97.6°F | Ht 74.0 in | Wt 194.0 lb

## 2022-04-19 DIAGNOSIS — R058 Other specified cough: Secondary | ICD-10-CM

## 2022-04-19 MED ORDER — PREDNISONE 10 MG PO TABS
ORAL_TABLET | ORAL | 0 refills | Status: DC
Start: 2022-04-19 — End: 2022-06-12

## 2022-04-19 NOTE — Progress Notes (Signed)
Subjective:    Patient ID: Todd [name], male    DOB: 03/31/1945, 77 y.o.   MRN: [id number]  HPI 77 year old male who  has a past medical history of DIABETES MELLITUS, TYPE II (08/08/2009), Hiatal hernia, History of nuclear stress test, HYPERLIPIDEMIA (08/08/2009), Palpitations, and URINARY INCONTINENCE (08/08/2009).  He presents to the office today for follow up.  He was seen roughly 3 weeks ago for a dry cough.  His symptoms started in January where he initially had a sore throat and was coughing up phlegm.  He was seen at urgent care in [facility] at this time he did have a chest x-ray that was negative.  He was treated with bronchitis for course of doxycycline, Tessalon Perles, ProAir.  His symptoms improved eventually came back.  He returned to urgent care was treated with a steroid burst.  At this time he had COVID and flu testing done which were negative.  When I saw him 3 weeks ago he continued to have a dry cough and felt fatigued.  He did not have any fevers or chills.  On exam he had trace expiratory wheezing that resolved with coughing.  He was given a sample Symbicort.   Today he reports that he is feeling about 60-70% better. He is using Symbicort as directed. He has less cough but it is still present with clear phlegm a mild soreness in his throat, has had noticed a mild wheeze sometime when he lays down at night. He feels as though his energy level is returning.   He denies fevers or chills.    Review of Systems See HPI   Past Medical History:  Diagnosis Date   DIABETES MELLITUS, TYPE II 08/08/2009   Hiatal hernia    History of nuclear stress test    Myoview 02/2019: EF 54, apical inf and apical artifact, normal perfusion, low risk   HYPERLIPIDEMIA 08/08/2009   Palpitations    URINARY INCONTINENCE 08/08/2009    Social History   Socioeconomic History   Marital status: Married    Spouse name: Not on file   Number of children: Not on file   Years of education: Not  on file   Highest education level: Bachelor's degree (e.g., BA, AB, BS)  Occupational History   Occupation: self employed    Comment: consulting  Tobacco Use   Smoking status: Former    Packs/day: 2.00    Years: 10.00    Additional pack years: 0.00    Total pack years: 20.00    Types: Cigarettes    Quit date: 04/19/1977    Years since quitting: 45.0   Smokeless tobacco: Never   Tobacco comments:    discussed AAA but think he has had this  Vaping Use   Vaping Use: Never used  Substance and Sexual Activity   Alcohol use: Yes    Comment: 1 glass wine/month   Drug use: No   Sexual activity: Not on file  Other Topics Concern   Not on file  Social History Narrative   Left handed   Self em[ployed   Social Determinants of Health   Financial Resource Strain: Low Risk  (02/18/2022)   Overall Financial Resource Strain (CARDIA)    Difficulty of Paying Living Expenses: Not hard at all  Food Insecurity: No Food Insecurity (02/18/2022)   Hunger Vital Sign    Worried About Running Out of Food in the Last Year: Never true    Ran Out of Food in  the Last Year: Never true  Transportation Needs: No Transportation Needs (02/18/2022)   PRAPARE - Administrator, Civil Service (Medical): No    Lack of Transportation (Non-Medical): No  Physical Activity: Insufficiently Active (02/18/2022)   Exercise Vital Sign    Days of Exercise per Week: 1 day    Minutes of Exercise per Session: 60 min  Stress: No Stress Concern Present (02/18/2022)   [employer] of Occupational Health - Occupational Stress Questionnaire    Feeling of Stress : Only a little  Social Connections: Moderately Integrated (02/18/2022)   Social Connection and Isolation Panel [NHANES]    Frequency of Communication with Friends and Family: Twice a week    Frequency of Social Gatherings with Friends and Family: Once a week    Attends Religious Services: More than 4 times per year    Active Member of [employer] or  Organizations: No    Attends Banker Meetings: Not on file    Marital Status: Married  Catering manager Violence: Not At Risk (04/17/2021)   Humiliation, Afraid, Rape, and Kick questionnaire    Fear of Current or Ex-Partner: No    Emotionally Abused: No    Physically Abused: No    Sexually Abused: No    Past Surgical History:  Procedure Laterality Date   ACHILLES TENDON REPAIR  01/08/2000   rupture   APPENDECTOMY  01/07/1974   CARDIOVASCULAR STRESS TEST  11/02/2003   EF 57%   MENISCUS REPAIR Left    TONSILLECTOMY  01/08/1959   US ECHOCARDIOGRAPHY  02/10/2001   EF 60-65%    Family History  Problem Relation Age of Onset   Mental illness Mother    Diabetes Sister        type ll    Allergies  Allergen Reactions   Jardiance [Empagliflozin]     Yeast infections   Diphenhydramine-Phenylephrine Other (See Comments)   Lisinopril Swelling   Pseudoephedrine Hcl Other (See Comments)   Sudafed Pe Sinus Cong Day-Nght  [Diphenhydramine-Phenylephrine] Other (See Comments)   Triprolidine-Pse Rash   Actifed Cold-Sinus     rash   Pseudoephedrine     REACTION: hives   Triprolidine Hcl     Current Outpatient Medications on File Prior to Visit  Medication Sig Dispense Refill   Cholecalciferol (VITAMIN D-3 PO) Take 1 tablet by mouth daily in the afternoon.     Coenzyme Q10 (CO Q 10 PO) Take 200 mg by mouth daily.     fenofibrate 160 MG tablet TAKE 1 TABLET BY MOUTH DAILY 90 tablet 3   finasteride (PROSCAR) 5 MG tablet Take 5 mg by mouth daily.     metFORMIN (GLUCOPHAGE) 1000 MG tablet TAKE 1 TABLET BY MOUTH TWICE  DAILY WITH MEALS 180 tablet 1   mirabegron ER (MYRBETRIQ) 50 MG TB24 tablet Take 50 mg by mouth daily.     omeprazole (PRILOSEC) 40 MG capsule 1 cap(s) orally 20 minutes before breakfast for 30 day(s     polycarbophil (FIBERCON) 625 MG tablet Take 625 mg by mouth daily. Taking 2 Tablet     rosuvastatin (CRESTOR) 10 MG tablet TAKE 1 TABLET BY MOUTH DAILY 90  tablet 3   tirzepatide (MOUNJARO) 5 MG/0.5ML Pen INJECT THE CONTENTS OF ONE PEN  SUBCUTANEOUSLY WEEKLY AS  DIRECTED 6 mL 1   vitamin B-12 (CYANOCOBALAMIN) 1000 MCG tablet Take 1,000 mcg by mouth daily.     VITAMIN D, ERGOCALCIFEROL, PO Take 2,000 Units by mouth daily.  No current facility-administered medications on file prior to visit.    BP 130/70   Pulse 88   Temp 97.6 F (36.4 C) (Oral)   Ht  (1.88 m)   Wt 194 lb (88 kg)   SpO2 96%   BMI 24.91 kg/m       Objective:   Physical Exam Vitals and nursing note reviewed.  Constitutional:      Appearance: Normal appearance.  Cardiovascular:     Rate and Rhythm: Normal rate and regular rhythm.     Pulses: Normal pulses.     Heart sounds: Normal heart sounds.  Pulmonary:     Effort: Pulmonary effort is normal.     Breath sounds: Normal breath sounds.  Skin:    General: Skin is warm.  Neurological:     General: No focal deficit present.     Mental Status: He is alert and oriented to person, place, and time.  Psychiatric:        Mood and Affect: Mood normal.        Behavior: Behavior normal.        Thought Content: Thought content normal.        Judgment: Judgment normal.       Assessment & Plan:  1. Post-viral cough syndrome - lungs clear throughout. Will prescribe an additional taper of prednisone to hopefully knock this out for him. Advised to drink plenty of water while taking prednisone. Can place a humidifier at bedside to help with symptoms  - predniSONE (DELTASONE) 10 MG tablet; 40 mg x 3 days, 20 mg x 3 days, 10 mg x 3 days  Dispense: 21 tablet; Refill: 0  Shirline Frees, NP

## 2022-04-20 DIAGNOSIS — M9901 Segmental and somatic dysfunction of cervical region: Secondary | ICD-10-CM | POA: Diagnosis not present

## 2022-04-20 DIAGNOSIS — M9905 Segmental and somatic dysfunction of pelvic region: Secondary | ICD-10-CM | POA: Diagnosis not present

## 2022-04-24 ENCOUNTER — Ambulatory Visit (INDEPENDENT_AMBULATORY_CARE_PROVIDER_SITE_OTHER): Payer: Medicare Other

## 2022-04-24 VITALS — Ht 74.0 in | Wt 194.0 lb

## 2022-04-24 DIAGNOSIS — Z0000 Encounter for general adult medical examination without abnormal findings: Secondary | ICD-10-CM

## 2022-04-24 NOTE — Progress Notes (Signed)
Subjective:   Todd [name] is a 77 y.o. male who presents for Medicare Annual/Subsequent preventive examination.  Review of Systems    Virtual Visit via Telephone Note  I connected with  Todd [name] on 04/24/22 at 12:30 PM EDT by telephone and verified that I am speaking with the correct person using two identifiers.  Location: Patient: Home Provider: Office Persons participating in the virtual visit: patient/Nurse Health Advisor   I discussed the limitations, risks, security and privacy concerns of performing an evaluation and management service by telephone and the availability of in person appointments. The patient expressed understanding and agreed to proceed.  Interactive audio and video telecommunications were attempted between this nurse and patient, however failed, due to patient having technical difficulties OR patient did not have access to video capability.  We continued and completed visit with audio only.  Some vital signs may be absent or patient reported.   [name], LPN  Cardiac Risk Factors include: advanced age (>65men, >55 women);diabetes mellitus;male gender     Objective:    Today's Vitals   04/24/22 1235  Weight: 194 lb (88 kg)  Height: 6\' 2"  (1.88 m)   Body mass index is 24.91 kg/m.     04/24/2022   12:41 PM 11/13/2021   12:53 PM 04/17/2021    8:52 AM 04/11/2020    8:54 AM 04/29/2017    1:34 PM  Advanced Directives  Does Patient Have a Medical Advance Directive? No No No No No  Would patient like information on creating a medical advance directive? No - Patient declined  No - Patient declined No - Patient declined     Current Medications (verified) Outpatient Encounter Medications as of 04/24/2022  Medication Sig   Cholecalciferol (VITAMIN D-3 PO) Take 1 tablet by mouth daily in the afternoon.   Coenzyme Q10 (CO Q 10 PO) Take 200 mg by mouth daily.   fenofibrate 160 MG tablet TAKE 1 TABLET BY MOUTH DAILY   finasteride (PROSCAR) 5 MG  tablet Take 5 mg by mouth daily.   metFORMIN (GLUCOPHAGE) 1000 MG tablet TAKE 1 TABLET BY MOUTH TWICE  DAILY WITH MEALS   mirabegron ER (MYRBETRIQ) 50 MG TB24 tablet Take 50 mg by mouth daily.   omeprazole (PRILOSEC) 40 MG capsule 1 cap(s) orally 20 minutes before breakfast for 30 day(s   polycarbophil (FIBERCON) 625 MG tablet Take 625 mg by mouth daily. Taking 2 Tablet   predniSONE (DELTASONE) 10 MG tablet 40 mg x 3 days, 20 mg x 3 days, 10 mg x 3 days   rosuvastatin (CRESTOR) 10 MG tablet TAKE 1 TABLET BY MOUTH DAILY   tirzepatide (MOUNJARO) 5 MG/0.5ML Pen INJECT THE CONTENTS OF ONE PEN  SUBCUTANEOUSLY WEEKLY AS  DIRECTED   vitamin B-12 (CYANOCOBALAMIN) 1000 MCG tablet Take 1,000 mcg by mouth daily.   VITAMIN D, ERGOCALCIFEROL, PO Take 2,000 Units by mouth daily.    No facility-administered encounter medications on file as of 04/24/2022.    Allergies (verified) Jardiance [empagliflozin], Diphenhydramine-phenylephrine, Lisinopril, Pseudoephedrine hcl, Sudafed pe sinus cong day-nght  [diphenhydramine-phenylephrine], Triprolidine-pse, Actifed cold-sinus, Pseudoephedrine, and Triprolidine hcl   History: Past Medical History:  Diagnosis Date   DIABETES MELLITUS, TYPE II 08/08/2009   Hiatal hernia    History of nuclear stress test    Myoview 02/2019: EF 54, apical inf and apical artifact, normal perfusion, low risk   HYPERLIPIDEMIA 08/08/2009   Palpitations    URINARY INCONTINENCE 08/08/2009   Past Surgical History:  Procedure  Laterality Date   ACHILLES TENDON REPAIR  01/08/2000   rupture   APPENDECTOMY  01/07/1974   CARDIOVASCULAR STRESS TEST  11/02/2003   EF 57%   MENISCUS REPAIR Left    TONSILLECTOMY  01/08/1959   US ECHOCARDIOGRAPHY  02/10/2001   EF 60-65%   Family History  Problem Relation Age of Onset   Mental illness Mother    Diabetes Sister        type ll   Social History   Socioeconomic History   Marital status: Married    Spouse name: Not on file   Number of  children: Not on file   Years of education: Not on file   Highest education level: Bachelor's degree (e.g., BA, AB, BS)  Occupational History   Occupation: self employed    Comment: consulting  Tobacco Use   Smoking status: Former    Packs/day: 2.00    Years: 10.00    Additional pack years: 0.00    Total pack years: 20.00    Types: Cigarettes    Quit date: 04/19/1977    Years since quitting: 45.0   Smokeless tobacco: Never   Tobacco comments:    discussed AAA but think he has had this  Vaping Use   Vaping Use: Never used  Substance and Sexual Activity   Alcohol use: Yes    Comment: 1 glass wine/month   Drug use: No   Sexual activity: Not on file  Other Topics Concern   Not on file  Social History Narrative   Left handed   Self em[ployed   Social Determinants of Health   Financial Resource Strain: Low Risk  (04/24/2022)   Overall Financial Resource Strain (CARDIA)    Difficulty of Paying Living Expenses: Not hard at all  Food Insecurity: No Food Insecurity (04/24/2022)   Hunger Vital Sign    Worried About Running Out of Food in the Last Year: Never true    Ran Out of Food in the Last Year: Never true  Transportation Needs: No Transportation Needs (04/24/2022)   PRAPARE - Administrator, Civil Service (Medical): No    Lack of Transportation (Non-Medical): No  Physical Activity: Insufficiently Active (04/24/2022)   Exercise Vital Sign    Days of Exercise per Week: 1 day    Minutes of Exercise per Session: 30 min  Stress: No Stress Concern Present (04/24/2022)   [employer] of Occupational Health - Occupational Stress Questionnaire    Feeling of Stress : Not at all  Social Connections: Socially Integrated (04/24/2022)   Social Connection and Isolation Panel [NHANES]    Frequency of Communication with Friends and Family: More than three times a week    Frequency of Social Gatherings with Friends and Family: More than three times a week    Attends Religious  Services: More than 4 times per year    Active Member of [employer] or Organizations: Yes    Attends Engineer, structural: More than 4 times per year    Marital Status: Married    Tobacco Counseling Counseling given: Not Answered Tobacco comments: discussed AAA but think he has had this   Clinical Intake:  Pre-visit preparation completed: No  Pain : No/denies pain     BMI - recorded: 24.91 Nutritional Status: BMI of 19-24  Normal Nutritional Risks: None Diabetes: No  How often do you need to have someone help you when you read instructions, pamphlets, or other written materials from your doctor or pharmacy?: 1 -  Never  Diabetic?  Yes  Interpreter Needed?: NoNutrition Risk Assessment:  Has the patient had any N/V/D within the last 2 months?  No  Does the patient have any non-healing wounds?  No  Has the patient had any unintentional weight loss or weight gain?  No   Diabetes:  Is the patient diabetic?  Yes  If diabetic, was a CBG obtained today?  No  Did the patient bring in their glucometer from home?  No  How often do you monitor your CBG's? PRN.   Financial Strains and Diabetes Management:  Are you having any financial strains with the device, your supplies or your medication? No .  Does the patient want to be seen by Chronic Care Management for management of their diabetes?  No  Would the patient like to be referred to a Nutritionist or for Diabetic Management?  No   Diabetic Exams:  Diabetic Eye Exam: Completed . Overdue for diabetic eye exam. Pt has been advised about the importance in completing this exam. A referral has been placed today. Message sent to referral coordinator for scheduling purposes. Advised pt to expect a call from office referred to regarding appt.  Diabetic Foot Exam: Completed . Pt has been advised about the importance in completing this exam. Pt is scheduled for diabetic foot exam on Followed by PCP.    Information entered by ::  Theresa Mulligan LPN   Activities of Daily Living    04/24/2022   12:39 PM 04/22/2022    1:39 PM  In your present state of health, do you have any difficulty performing the following activities:  Hearing? 0 0  Vision? 0 0  Difficulty concentrating or making decisions? 0 0  Walking or climbing stairs? 0 0  Dressing or bathing? 0 0  Doing errands, shopping? 0 0  Preparing Food and eating ? N N  Using the Toilet? N N  In the past six months, have you accidently leaked urine? N N  Do you have problems with loss of bowel control? N N  Managing your Medications? N N  Managing your Finances? N N  Housekeeping or managing your Housekeeping? N N    Patient Care Team: Kristian Covey, MD as PCP - General [name], Deloris Ping, MD as PCP - Cardiology (Cardiology) Verner Chol, [facility] (Inactive) as Pharmacist (Pharmacist)  Indicate any recent Medical Services you may have received from other than [facility] providers in the past year (date may be approximate).     Assessment:   This is a routine wellness examination for Markel.  Hearing/Vision screen Hearing Screening - Comments:: Denies hearing difficulties   Vision Screening - Comments:: Wears rx glasses - up to date with routine eye exams with [facility]   Dietary issues and exercise activities discussed: Current Exercise Habits: Home exercise routine, Type of exercise: walking, Time (Minutes): 30, Frequency (Times/Week): 1, Weekly Exercise (Minutes/Week): 30, Intensity: Moderate, Exercise limited by: None identified   Goals Addressed               This Visit's Progress     Patient stated (pt-stated)        I would like to retire.       Depression Screen    04/24/2022   12:39 PM 02/20/2022   11:21 AM 04/17/2021    8:48 AM 12/06/2020    2:05 PM 04/11/2020    8:52 AM 04/21/2019    1:21 PM 04/29/2017    1:37 PM  PHQ 2/9  Scores  PHQ - 2 Score 0 0 0 0 0 0 0  PHQ- 9 Score    2       Fall Risk    04/24/2022   12:40 PM  04/22/2022    1:39 PM 02/18/2022    3:01 PM 11/13/2021   12:53 PM 04/17/2021    8:51 AM  Fall Risk   Falls in the past year? 0 0 0 0 0  Number falls in past yr: 0 0  0 0  Injury with Fall? 0 0   0  Risk for fall due to : No Fall Risks    No Fall Risks  Follow up Falls prevention discussed        FALL RISK PREVENTION PERTAINING TO THE HOME:  Any stairs in or around the home? Yes  If so, are there any without handrails? No  Home free of loose throw rugs in walkways, pet beds, electrical cords, etc? Yes  Adequate lighting in your home to reduce risk of falls? Yes   ASSISTIVE DEVICES UTILIZED TO PREVENT FALLS:  Life alert? No  Use of a cane, walker or w/c? No  Grab bars in the bathroom? No  Shower chair or bench in shower? No  Elevated toilet seat or a handicapped toilet? No   TIMED UP AND GO:  Was the test performed? No . Audio Visit   Cognitive Function:        04/24/2022   12:41 PM 04/17/2021    8:52 AM 04/11/2020    8:58 AM  6CIT Screen  What Year? 0 points 0 points 0 points  What month? 0 points 0 points 0 points  What time? 0 points 0 points   Count back from 20 0 points 0 points 0 points  Months in reverse 0 points 0 points 0 points  Repeat phrase 0 points 0 points 0 points  Total Score 0 points 0 points     Immunizations Immunization History  Administered Date(s) Administered   Fluad Quad(high Dose 65+) 08/31/2018, 10/11/2019   Influenza Split 10/08/2010, 10/14/2011   Influenza Whole 09/22/2009   Influenza, High Dose Seasonal PF 09/25/2015, 10/11/2016   Influenza,inj,Quad PF,6+ Mos 10/02/2012, 09/06/2013, 09/19/2014, 09/29/2017   Influenza-Unspecified 10/07/2020   PFIZER(Purple Top)SARS-COV-2 Vaccination 02/13/2019, 03/06/2019, 11/22/2019, 05/08/2020   PNEUMOCOCCAL CONJUGATE-20 04/20/2020   Pneumococcal Conjugate-13 12/14/2013   Pneumococcal Polysaccharide-23 10/14/2011   Tdap 11/08/2007   Zoster Recombinat (Shingrix) 12/21/2019, 03/16/2020   Zoster, Live  11/08/2007    TDAP status: Due, Education has been provided regarding the importance of this vaccine. Advised may receive this vaccine at local pharmacy or Health Dept. Aware to provide a copy of the vaccination record if obtained from local pharmacy or Health Dept. Verbalized acceptance and understanding.  Flu Vaccine status: Up to date  Pneumococcal vaccine status: Up to date  Covid-19 vaccine status: Completed vaccines  Qualifies for Shingles Vaccine? Yes   Zostavax completed Yes   Shingrix Completed?: Yes  Screening Tests Health Maintenance  Topic Date Due   DTaP/Tdap/Td (2 - Td or Tdap) 11/07/2017   FOOT EXAM  07/18/2021   OPHTHALMOLOGY EXAM  04/24/2022 (Originally 01/25/2022)   Diabetic kidney evaluation - Urine ACR  04/25/2022 (Originally 07/18/2021)   COVID-19 Vaccine (5 - 2023-24 season) 05/10/2022 (Originally 09/07/2021)   INFLUENZA VACCINE  08/08/2022   HEMOGLOBIN A1C  08/21/2022   Diabetic kidney evaluation - eGFR measurement  02/21/2023   Medicare Annual Wellness (AWV)  04/24/2023   COLONOSCOPY (Pts 45-29yrs Insurance coverage  will need to be confirmed)  05/17/2030   Pneumonia Vaccine 15+ Years old  Completed   Hepatitis C Screening  Completed   Zoster Vaccines- Shingrix  Completed   HPV VACCINES  Aged Out    Health Maintenance  Health Maintenance Due  Topic Date Due   DTaP/Tdap/Td (2 - Td or Tdap) 11/07/2017   FOOT EXAM  07/18/2021    Colorectal cancer screening: Type of screening: Colonoscopy. Completed 05/16/20. Repeat every 10 years  Lung Cancer Screening: (Low Dose CT Chest recommended if Age 28-80 years, 30 pack-year currently smoking OR have quit w/in 15years.) does not qualify.     Additional Screening:  Hepatitis C Screening: does qualify; Completed 01/12/16  Vision Screening: Recommended annual ophthalmology exams for early detection of glaucoma and other disorders of the eye. Is the patient up to date with their annual eye exam?  Yes  Who is the  provider or what is the name of the office in which the patient attends annual eye exams? [facility] If pt is not established with a provider, would they like to be referred to a provider to establish care? No .   Dental Screening: Recommended annual dental exams for proper oral hygiene  Community Resource Referral / Chronic Care Management:  CRR required this visit?  No   CCM required this visit?  No      Plan:     I have personally reviewed and noted the following in the patient's chart:   Medical and social history Use of alcohol, tobacco or illicit drugs  Current medications and supplements including opioid prescriptions. Patient is not currently taking opioid prescriptions. Functional ability and status Nutritional status Physical activity Advanced directives List of other physicians Hospitalizations, surgeries, and ER visits in previous 12 months Vitals Screenings to include cognitive, depression, and falls Referrals and appointments  In addition, I have reviewed and discussed with patient certain preventive protocols, quality metrics, and best practice recommendations. A written personalized care plan for preventive services as well as general preventive health recommendations were provided to patient.     [name], LPN   [id number]/61/0960   Nurse Notes: Patient due Diabetic kidney evaluation-Urine ACR

## 2022-04-24 NOTE — Patient Instructions (Addendum)
Todd [name] , Thank you for taking time to come for your Medicare Wellness Visit. I appreciate your ongoing commitment to your health goals. Please review the following plan we discussed and let me know if I can assist you in the future.   These are the goals we discussed:  Goals       Exercise 150 min/wk Moderate Activity      Likes to power walk and bike Wife likes to walk as well Has a 2 mile track at his home.       Exercise 150 minutes per week (moderate activity)      Walking 19147 steps per day!!!      Increase physical activity (pt-stated)      Lose weight      Patient Stated      Lose 20lbs       Patient stated (pt-stated)      I would like to retire.        This is a list of the screening recommended for you and due dates:  Health Maintenance  Topic Date Due   DTaP/Tdap/Td vaccine (2 - Td or Tdap) 11/07/2017   Complete foot exam   07/18/2021   Eye exam for diabetics  04/24/2022*   Yearly kidney health urinalysis for diabetes  04/25/2022*   COVID-19 Vaccine (5 - 2023-24 season) 05/10/2022*   Flu Shot  08/08/2022   Hemoglobin A1C  08/21/2022   Yearly kidney function blood test for diabetes  02/21/2023   Medicare Annual Wellness Visit  04/24/2023   Colon Cancer Screening  05/17/2030   Pneumonia Vaccine  Completed   Hepatitis C Screening: USPSTF Recommendation to screen - Ages 18-79 yo.  Completed   Zoster (Shingles) Vaccine  Completed   HPV Vaccine  Aged Out  *Topic was postponed. The date shown is not the original due date.    Advanced directives: Advance directive discussed with you today. Even though you declined this today, please call our office should you change your mind, and we can give you the proper paperwork for you to fill out.   Conditions/risks identified: None  Next appointment: Follow up in one year for your annual wellness visit.   Preventive Care 76 Years and Older, Male  Preventive care refers to lifestyle choices and visits with your  health care provider that can promote health and wellness. What does preventive care include? A yearly physical exam. This is also called an annual well check. Dental exams once or twice a year. Routine eye exams. Ask your health care provider how often you should have your eyes checked. Personal lifestyle choices, including: Daily care of your teeth and gums. Regular physical activity. Eating a healthy diet. Avoiding tobacco and drug use. Limiting alcohol use. Practicing safe sex. Taking low doses of aspirin every day. Taking vitamin and mineral supplements as recommended by your health care provider. What happens during an annual well check? The services and screenings done by your health care provider during your annual well check will depend on your age, overall health, lifestyle risk factors, and family history of disease. Counseling  Your health care provider may ask you questions about your: Alcohol use. Tobacco use. Drug use. Emotional well-being. Home and relationship well-being. Sexual activity. Eating habits. History of falls. Memory and ability to understand (cognition). Work and work Astronomer. Screening  You may have the following tests or measurements: Height, weight, and BMI. Blood pressure. Lipid and cholesterol levels. These may be checked every 5 years, or  more frequently if you are over 30 years old. Skin check. Lung cancer screening. You may have this screening every year starting at age 80 if you have a 30-pack-year history of smoking and currently smoke or have quit within the past 15 years. Fecal occult blood test (FOBT) of the stool. You may have this test every year starting at age 23. Flexible sigmoidoscopy or colonoscopy. You may have a sigmoidoscopy every 5 years or a colonoscopy every 10 years starting at age 109. Prostate cancer screening. Recommendations will vary depending on your family history and other risks. Hepatitis C blood  test. Hepatitis B blood test. Sexually transmitted disease (STD) testing. Diabetes screening. This is done by checking your blood sugar (glucose) after you have not eaten for a while (fasting). You may have this done every 1-3 years. Abdominal aortic aneurysm (AAA) screening. You may need this if you are a current or former smoker. Osteoporosis. You may be screened starting at age 69 if you are at high risk. Talk with your health care provider about your test results, treatment options, and if necessary, the need for more tests. Vaccines  Your health care provider may recommend certain vaccines, such as: Influenza vaccine. This is recommended every year. Tetanus, diphtheria, and acellular pertussis (Tdap, Td) vaccine. You may need a Td booster every 10 years. Zoster vaccine. You may need this after age 76. Pneumococcal 13-valent conjugate (PCV13) vaccine. One dose is recommended after age 45. Pneumococcal polysaccharide (PPSV23) vaccine. One dose is recommended after age 80. Talk to your health care provider about which screenings and vaccines you need and how often you need them. This information is not intended to replace advice given to you by your health care provider. Make sure you discuss any questions you have with your health care provider. Document Released: 01/20/2015 Document Revised: 09/13/2015 Document Reviewed: 10/25/2014 Elsevier Interactive Patient Education  2017 [employer].  Fall Prevention in the Home Falls can cause injuries. They can happen to people of all ages. There are many things you can do to make your home safe and to help prevent falls. What can I do on the outside of my home? Regularly fix the edges of walkways and driveways and fix any cracks. Remove anything that might make you trip as you walk through a door, such as a raised step or threshold. Trim any bushes or trees on the path to your home. Use bright outdoor lighting. Clear any walking paths of  anything that might make someone trip, such as rocks or tools. Regularly check to see if handrails are loose or broken. Make sure that both sides of any steps have handrails. Any raised decks and porches should have guardrails on the edges. Have any leaves, snow, or ice cleared regularly. Use sand or salt on walking paths during winter. Clean up any spills in your garage right away. This includes oil or grease spills. What can I do in the bathroom? Use night lights. Install grab bars by the toilet and in the tub and shower. Do not use towel bars as grab bars. Use non-skid mats or decals in the tub or shower. If you need to sit down in the shower, use a plastic, non-slip stool. Keep the floor dry. Clean up any water that spills on the floor as soon as it happens. Remove soap buildup in the tub or shower regularly. Attach bath mats securely with double-sided non-slip rug tape. Do not have throw rugs and other things on the floor that  can make you trip. What can I do in the bedroom? Use night lights. Make sure that you have a light by your bed that is easy to reach. Do not use any sheets or blankets that are too big for your bed. They should not hang down onto the floor. Have a firm chair that has side arms. You can use this for support while you get dressed. Do not have throw rugs and other things on the floor that can make you trip. What can I do in the kitchen? Clean up any spills right away. Avoid walking on wet floors. Keep items that you use a lot in easy-to-reach places. If you need to reach something above you, use a strong step stool that has a grab bar. Keep electrical cords out of the way. Do not use floor polish or wax that makes floors slippery. If you must use wax, use non-skid floor wax. Do not have throw rugs and other things on the floor that can make you trip. What can I do with my stairs? Do not leave any items on the stairs. Make sure that there are handrails on both  sides of the stairs and use them. Fix handrails that are broken or loose. Make sure that handrails are as long as the stairways. Check any carpeting to make sure that it is firmly attached to the stairs. Fix any carpet that is loose or worn. Avoid having throw rugs at the top or bottom of the stairs. If you do have throw rugs, attach them to the floor with carpet tape. Make sure that you have a light switch at the top of the stairs and the bottom of the stairs. If you do not have them, ask someone to add them for you. What else can I do to help prevent falls? Wear shoes that: Do not have high heels. Have rubber bottoms. Are comfortable and fit you well. Are closed at the toe. Do not wear sandals. If you use a stepladder: Make sure that it is fully opened. Do not climb a closed stepladder. Make sure that both sides of the stepladder are locked into place. Ask someone to hold it for you, if possible. Clearly mark and make sure that you can see: Any grab bars or handrails. First and last steps. Where the edge of each step is. Use tools that help you move around (mobility aids) if they are needed. These include: Canes. Walkers. Scooters. Crutches. Turn on the lights when you go into a dark area. Replace any light bulbs as soon as they burn out. Set up your furniture so you have a clear path. Avoid moving your furniture around. If any of your floors are uneven, fix them. If there are any pets around you, be aware of where they are. Review your medicines with your doctor. Some medicines can make you feel dizzy. This can increase your chance of falling. Ask your doctor what other things that you can do to help prevent falls. This information is not intended to replace advice given to you by your health care provider. Make sure you discuss any questions you have with your health care provider. Document Released: 10/20/2008 Document Revised: 06/01/2015 Document Reviewed: 01/28/2014 Elsevier  Interactive Patient Education  2017 [employer].

## 2022-04-27 DIAGNOSIS — M9901 Segmental and somatic dysfunction of cervical region: Secondary | ICD-10-CM | POA: Diagnosis not present

## 2022-04-27 DIAGNOSIS — M5030 Other cervical disc degeneration, unspecified cervical region: Secondary | ICD-10-CM | POA: Diagnosis not present

## 2022-04-27 DIAGNOSIS — M9905 Segmental and somatic dysfunction of pelvic region: Secondary | ICD-10-CM | POA: Diagnosis not present

## 2022-04-29 ENCOUNTER — Other Ambulatory Visit: Payer: Self-pay | Admitting: Urology

## 2022-05-04 DIAGNOSIS — M9901 Segmental and somatic dysfunction of cervical region: Secondary | ICD-10-CM | POA: Diagnosis not present

## 2022-05-04 DIAGNOSIS — M9905 Segmental and somatic dysfunction of pelvic region: Secondary | ICD-10-CM | POA: Diagnosis not present

## 2022-05-10 DIAGNOSIS — M9905 Segmental and somatic dysfunction of pelvic region: Secondary | ICD-10-CM | POA: Diagnosis not present

## 2022-05-10 DIAGNOSIS — M5030 Other cervical disc degeneration, unspecified cervical region: Secondary | ICD-10-CM | POA: Diagnosis not present

## 2022-05-10 DIAGNOSIS — M9901 Segmental and somatic dysfunction of cervical region: Secondary | ICD-10-CM | POA: Diagnosis not present

## 2022-05-17 ENCOUNTER — Encounter: Payer: Self-pay | Admitting: Adult Health

## 2022-05-17 NOTE — Telephone Encounter (Signed)
Please advise 

## 2022-05-18 DIAGNOSIS — M9901 Segmental and somatic dysfunction of cervical region: Secondary | ICD-10-CM | POA: Diagnosis not present

## 2022-05-18 DIAGNOSIS — M9905 Segmental and somatic dysfunction of pelvic region: Secondary | ICD-10-CM | POA: Diagnosis not present

## 2022-05-19 ENCOUNTER — Other Ambulatory Visit: Payer: Self-pay | Admitting: Family

## 2022-05-19 DIAGNOSIS — E119 Type 2 diabetes mellitus without complications: Secondary | ICD-10-CM

## 2022-05-19 MED ORDER — TIRZEPATIDE 2.5 MG/0.5ML ~~LOC~~ SOAJ: 2.5000 mg | SUBCUTANEOUS | 1 refills | Status: DC

## 2022-05-21 ENCOUNTER — Other Ambulatory Visit: Payer: Self-pay | Admitting: Family Medicine

## 2022-05-24 DIAGNOSIS — M9905 Segmental and somatic dysfunction of pelvic region: Secondary | ICD-10-CM | POA: Diagnosis not present

## 2022-05-24 DIAGNOSIS — M9902 Segmental and somatic dysfunction of thoracic region: Secondary | ICD-10-CM | POA: Diagnosis not present

## 2022-05-24 DIAGNOSIS — M9901 Segmental and somatic dysfunction of cervical region: Secondary | ICD-10-CM | POA: Diagnosis not present

## 2022-05-24 DIAGNOSIS — M5030 Other cervical disc degeneration, unspecified cervical region: Secondary | ICD-10-CM | POA: Diagnosis not present

## 2022-05-24 DIAGNOSIS — M5134 Other intervertebral disc degeneration, thoracic region: Secondary | ICD-10-CM | POA: Diagnosis not present

## 2022-05-24 DIAGNOSIS — M5136 Other intervertebral disc degeneration, lumbar region: Secondary | ICD-10-CM | POA: Diagnosis not present

## 2022-05-31 DIAGNOSIS — M9902 Segmental and somatic dysfunction of thoracic region: Secondary | ICD-10-CM | POA: Diagnosis not present

## 2022-05-31 DIAGNOSIS — M5134 Other intervertebral disc degeneration, thoracic region: Secondary | ICD-10-CM | POA: Diagnosis not present

## 2022-05-31 DIAGNOSIS — M9905 Segmental and somatic dysfunction of pelvic region: Secondary | ICD-10-CM | POA: Diagnosis not present

## 2022-05-31 DIAGNOSIS — M5030 Other cervical disc degeneration, unspecified cervical region: Secondary | ICD-10-CM | POA: Diagnosis not present

## 2022-05-31 DIAGNOSIS — M5136 Other intervertebral disc degeneration, lumbar region: Secondary | ICD-10-CM | POA: Diagnosis not present

## 2022-05-31 DIAGNOSIS — M9901 Segmental and somatic dysfunction of cervical region: Secondary | ICD-10-CM | POA: Diagnosis not present

## 2022-06-04 ENCOUNTER — Encounter: Payer: Self-pay | Admitting: Adult Health

## 2022-06-04 ENCOUNTER — Other Ambulatory Visit: Payer: Self-pay | Admitting: Adult Health

## 2022-06-04 DIAGNOSIS — R058 Other specified cough: Secondary | ICD-10-CM

## 2022-06-05 ENCOUNTER — Encounter: Payer: Self-pay | Admitting: Adult Health

## 2022-06-08 DIAGNOSIS — M5030 Other cervical disc degeneration, unspecified cervical region: Secondary | ICD-10-CM | POA: Diagnosis not present

## 2022-06-08 DIAGNOSIS — M9901 Segmental and somatic dysfunction of cervical region: Secondary | ICD-10-CM | POA: Diagnosis not present

## 2022-06-08 DIAGNOSIS — M9905 Segmental and somatic dysfunction of pelvic region: Secondary | ICD-10-CM | POA: Diagnosis not present

## 2022-06-12 ENCOUNTER — Ambulatory Visit (INDEPENDENT_AMBULATORY_CARE_PROVIDER_SITE_OTHER): Payer: Medicare Other | Admitting: Adult Health

## 2022-06-12 ENCOUNTER — Encounter: Payer: Self-pay | Admitting: Adult Health

## 2022-06-12 VITALS — BP 138/70 | HR 75 | Temp 98.1°F | Ht 74.0 in | Wt 200.0 lb

## 2022-06-12 DIAGNOSIS — M5416 Radiculopathy, lumbar region: Secondary | ICD-10-CM | POA: Diagnosis not present

## 2022-06-12 DIAGNOSIS — R051 Acute cough: Secondary | ICD-10-CM

## 2022-06-12 MED ORDER — PREDNISONE 20 MG PO TABS
20.0000 mg | ORAL_TABLET | Freq: Every day | ORAL | 0 refills | Status: DC
Start: 2022-06-12 — End: 2022-07-17

## 2022-06-12 NOTE — Progress Notes (Signed)
Subjective:    Patient ID: Todd [name], male    DOB: [birth year], 77 y.o.   MRN: [id number]  HPI 77 year old male who  has a past medical history of DIABETES MELLITUS, TYPE II (08/08/2009), Hiatal hernia, History of nuclear stress test, HYPERLIPIDEMIA (08/08/2009), Palpitations, and URINARY INCONTINENCE (08/08/2009).  He was seen about two months suspected postviral cough that have been present since January 2024.  He was treated with doxycycline and Tessalon Perles, and ProAir in early January, and things did improve but soon came back.  I saw him in early spring he was given Symbicort inhaler and did report that this helped, when he was seen in April he was about 60 to 70% better and we decided to do a steroid taper to help resolve his symptoms.  He does report that his symptoms resolved until about 2 weeks ago when he developed a dry cough that has not been as consistent or as significant as his cough and the wintertime.  He has not had any fevers or chills, shortness of breath, or chest pain.  The cough is more annoying than anything.   Review of Systems See HPI   Past Medical History:  Diagnosis Date   DIABETES MELLITUS, TYPE II 08/08/2009   Hiatal hernia    History of nuclear stress test    Myoview 02/2019: EF 54, apical inf and apical artifact, normal perfusion, low risk   HYPERLIPIDEMIA 08/08/2009   Palpitations    URINARY INCONTINENCE 08/08/2009    Social History   Socioeconomic History   Marital status: Married    Spouse name: Not on file   Number of children: Not on file   Years of education: Not on file   Highest education level: Bachelor's degree (e.g., BA, AB, BS)  Occupational History   Occupation: self employed    Comment: consulting  Tobacco Use   Smoking status: Former    Packs/day: 2.00    Years: 10.00    Additional pack years: 0.00    Total pack years: 20.00    Types: Cigarettes    Quit date: 04/19/1977    Years since quitting: 45.1   Smokeless tobacco: Never    Tobacco comments:    discussed AAA but think he has had this  Vaping Use   Vaping Use: Never used  Substance and Sexual Activity   Alcohol use: Yes    Comment: 1 glass wine/month   Drug use: No   Sexual activity: Not on file  Other Topics Concern   Not on file  Social History Narrative   Left handed   Self em[ployed   Social Determinants of Health   Financial Resource Strain: Low Risk  (04/24/2022)   Overall Financial Resource Strain (CARDIA)    Difficulty of Paying Living Expenses: Not hard at all  Food Insecurity: No Food Insecurity (04/24/2022)   Hunger Vital Sign    Worried About Running Out of Food in the Last Year: Never true    Ran Out of Food in the Last Year: Never true  Transportation Needs: No Transportation Needs (04/24/2022)   PRAPARE - Administrator, Civil Service (Medical): No    Lack of Transportation (Non-Medical): No  Physical Activity: Insufficiently Active (04/24/2022)   Exercise Vital Sign    Days of Exercise per Week: 1 day    Minutes of Exercise per Session: 30 min  Stress: No Stress Concern Present (04/24/2022)   [employer] of Occupational Health - Occupational Stress  Questionnaire    Feeling of Stress : Not at all  Social Connections: Socially Integrated (04/24/2022)   Social Connection and Isolation Panel [NHANES]    Frequency of Communication with Friends and Family: More than three times a week    Frequency of Social Gatherings with Friends and Family: More than three times a week    Attends Religious Services: More than 4 times per year    Active Member of [employer] or Organizations: Yes    Attends Banker Meetings: More than 4 times per year    Marital Status: Married  Catering manager Violence: Not At Risk (04/24/2022)   Humiliation, Afraid, Rape, and Kick questionnaire    Fear of Current or Ex-Partner: No    Emotionally Abused: No    Physically Abused: No    Sexually Abused: No    Past Surgical History:   Procedure Laterality Date   ACHILLES TENDON REPAIR  01/08/2000   rupture   APPENDECTOMY  01/07/1974   CARDIOVASCULAR STRESS TEST  11/02/2003   EF 57%   MENISCUS REPAIR Left    TONSILLECTOMY  01/08/1959   US ECHOCARDIOGRAPHY  02/10/2001   EF 60-65%    Family History  Problem Relation Age of Onset   Mental illness Mother    Diabetes Sister        type ll    Allergies  Allergen Reactions   Jardiance [Empagliflozin]     Yeast infections   Diphenhydramine-Phenylephrine Other (See Comments)   Lisinopril Swelling   Pseudoephedrine Hcl Other (See Comments)   Sudafed Pe Sinus Cong Day-Nght  [Diphenhydramine-Phenylephrine] Other (See Comments)   Triprolidine-Pse Rash   Actifed Cold-Sinus     rash   Pseudoephedrine     REACTION: hives   Triprolidine Hcl     Current Outpatient Medications on File Prior to Visit  Medication Sig Dispense Refill   Cholecalciferol (VITAMIN D-3 PO) Take 1 tablet by mouth daily in the afternoon.     Coenzyme Q10 (CO Q 10 PO) Take 200 mg by mouth daily.     fenofibrate 160 MG tablet TAKE 1 TABLET BY MOUTH DAILY 90 tablet 3   finasteride (PROSCAR) 5 MG tablet Take 5 mg by mouth daily.     metFORMIN (GLUCOPHAGE) 1000 MG tablet TAKE 1 TABLET BY MOUTH TWICE  DAILY WITH MEALS 200 tablet 2   mirabegron ER (MYRBETRIQ) 50 MG TB24 tablet Take 50 mg by mouth daily.     omeprazole (PRILOSEC) 40 MG capsule 1 cap(s) orally 20 minutes before breakfast for 30 day(s     polycarbophil (FIBERCON) 625 MG tablet Take 625 mg by mouth daily. Taking 2 Tablet     predniSONE (DELTASONE) 10 MG tablet 40 mg x 3 days, 20 mg x 3 days, 10 mg x 3 days 21 tablet 0   rosuvastatin (CRESTOR) 10 MG tablet TAKE 1 TABLET BY MOUTH DAILY 90 tablet 3   tirzepatide (MOUNJARO) 2.5 MG/0.5ML Pen Inject 2.5 mg into the skin once a week. 2 mL 1   vitamin B-12 (CYANOCOBALAMIN) 1000 MCG tablet Take 1,000 mcg by mouth daily.     VITAMIN D, ERGOCALCIFEROL, PO Take 2,000 Units by mouth daily.      No  current facility-administered medications on file prior to visit.    BP (!) 140/90   Pulse 75   Temp 98.1 F (36.7 C) (Oral)   Ht 6\' 2"  (1.88 m)   Wt 200 lb (90.7 kg)   SpO2 97%   BMI 25.68  kg/m       Objective:   Physical Exam Vitals and nursing note reviewed.  Constitutional:      Appearance: Normal appearance.  Cardiovascular:     Rate and Rhythm: Normal rate and regular rhythm.     Pulses: Normal pulses.     Heart sounds: Normal heart sounds.  Pulmonary:     Effort: Pulmonary effort is normal.  Skin:    General: Skin is warm.  Neurological:     General: No focal deficit present.     Mental Status: He is alert and oriented to person, place, and time.  Psychiatric:        Mood and Affect: Mood normal.        Behavior: Behavior normal.        Thought Content: Thought content normal.        Judgment: Judgment normal.       Assessment & Plan:   1. Acute cough -Treat with a short steroid burst.  Advise follow-up if not improving.  Can consider referral to pulmonary for ongoing cough. - predniSONE (DELTASONE) 20 MG tablet; Take 1 tablet (20 mg total) by mouth daily with breakfast.  Dispense: 5 tablet; Refill: 0

## 2022-06-15 DIAGNOSIS — M9905 Segmental and somatic dysfunction of pelvic region: Secondary | ICD-10-CM | POA: Diagnosis not present

## 2022-06-15 DIAGNOSIS — M9901 Segmental and somatic dysfunction of cervical region: Secondary | ICD-10-CM | POA: Diagnosis not present

## 2022-06-22 DIAGNOSIS — M9901 Segmental and somatic dysfunction of cervical region: Secondary | ICD-10-CM | POA: Diagnosis not present

## 2022-06-22 DIAGNOSIS — M9905 Segmental and somatic dysfunction of pelvic region: Secondary | ICD-10-CM | POA: Diagnosis not present

## 2022-06-22 DIAGNOSIS — M5030 Other cervical disc degeneration, unspecified cervical region: Secondary | ICD-10-CM | POA: Diagnosis not present

## 2022-06-29 DIAGNOSIS — M9901 Segmental and somatic dysfunction of cervical region: Secondary | ICD-10-CM | POA: Diagnosis not present

## 2022-06-29 DIAGNOSIS — M5030 Other cervical disc degeneration, unspecified cervical region: Secondary | ICD-10-CM | POA: Diagnosis not present

## 2022-06-29 DIAGNOSIS — M9905 Segmental and somatic dysfunction of pelvic region: Secondary | ICD-10-CM | POA: Diagnosis not present

## 2022-07-03 DIAGNOSIS — R300 Dysuria: Secondary | ICD-10-CM | POA: Diagnosis not present

## 2022-07-06 DIAGNOSIS — M5030 Other cervical disc degeneration, unspecified cervical region: Secondary | ICD-10-CM | POA: Diagnosis not present

## 2022-07-06 DIAGNOSIS — M9905 Segmental and somatic dysfunction of pelvic region: Secondary | ICD-10-CM | POA: Diagnosis not present

## 2022-07-06 DIAGNOSIS — M9901 Segmental and somatic dysfunction of cervical region: Secondary | ICD-10-CM | POA: Diagnosis not present

## 2022-07-17 ENCOUNTER — Ambulatory Visit (INDEPENDENT_AMBULATORY_CARE_PROVIDER_SITE_OTHER): Payer: Medicare Other | Admitting: Family Medicine

## 2022-07-17 ENCOUNTER — Encounter: Payer: Self-pay | Admitting: Family Medicine

## 2022-07-17 VITALS — BP 138/82 | HR 75 | Temp 98.2°F | Ht 74.0 in | Wt 192.8 lb

## 2022-07-17 DIAGNOSIS — Z0000 Encounter for general adult medical examination without abnormal findings: Secondary | ICD-10-CM | POA: Diagnosis not present

## 2022-07-17 DIAGNOSIS — Z7985 Long-term (current) use of injectable non-insulin antidiabetic drugs: Secondary | ICD-10-CM | POA: Diagnosis not present

## 2022-07-17 DIAGNOSIS — E119 Type 2 diabetes mellitus without complications: Secondary | ICD-10-CM | POA: Diagnosis not present

## 2022-07-17 LAB — HEMOGLOBIN A1C: Hgb A1c MFr Bld: 7.2 % — ABNORMAL HIGH (ref 4.6–6.5)

## 2022-07-17 LAB — MICROALBUMIN / CREATININE URINE RATIO
Creatinine,U: 250 mg/dL
Microalb Creat Ratio: 3.7 mg/g (ref 0.0–30.0)
Microalb, Ur: 9.2 mg/dL — ABNORMAL HIGH (ref 0.0–1.9)

## 2022-07-17 NOTE — Progress Notes (Signed)
Established Patient Office Visit  Subjective   Patient ID: Todd [name], male    DOB: 1945/07/21  Age: 77 y.o. MRN: [id number]  Chief Complaint  Patient presents with   Annual Exam    HPI   Todd [name] is seen for physical exam.  Past medical history reviewed.  He has history of hypertension, history of PVCs, type 2 diabetes, dyslipidemia.  He relates he feels like he has had some recent balance problems occasionally with standing.  No focal weakness.  Has mild essential tremor.  No recent vertigo.  No syncope.  He has type 2 diabetes and currently on Mounjaro 5 mg once weekly and blood sugars have been improved.  Lost some weight as expected when he went on the [facility] but weight has stabilized.  He is currently living down in the [place] area but has 2 children here.  Remarried 6 years ago.  Health maintenance reviewed  Health Maintenance  Topic Date Due   Diabetic kidney evaluation - Urine ACR  07/18/2021   COVID-19 Vaccine (5 - 2023-24 season) 09/07/2021   INFLUENZA VACCINE  08/08/2022   HEMOGLOBIN A1C  08/21/2022   OPHTHALMOLOGY EXAM  01/24/2023   Diabetic kidney evaluation - eGFR measurement  02/21/2023   Medicare Annual Wellness (AWV)  04/24/2023   FOOT EXAM  07/17/2023   Colonoscopy  05/17/2030   DTaP/Tdap/Td (3 - Td or Tdap) 06/03/2032   Pneumonia Vaccine 71+ Years old  Completed   Hepatitis C Screening  Completed   Zoster Vaccines- Shingrix  Completed   HPV VACCINES  Aged Out   Family history-mother died age 93 with complications of pancreatic cancer.  Father died age 78 complications from head injury.  5 siblings.  1 sister with diabetes.  Other siblings well  Social history-he remarried 6 years ago and currently lives in [place].  He has 1 grandchild from first marriage.  Smoked briefly in his 83s.  No regular alcohol.  Past Medical History:  Diagnosis Date   DIABETES MELLITUS, TYPE II 08/08/2009   Hiatal hernia    History of nuclear stress test    Myoview  02/2019: EF 54, apical inf and apical artifact, normal perfusion, low risk   HYPERLIPIDEMIA 08/08/2009   Palpitations    URINARY INCONTINENCE 08/08/2009   Past Surgical History:  Procedure Laterality Date   ACHILLES TENDON REPAIR  01/08/2000   rupture   APPENDECTOMY  01/07/1974   CARDIOVASCULAR STRESS TEST  11/02/2003   EF 57%   MENISCUS REPAIR Left    TONSILLECTOMY  01/08/1959   US ECHOCARDIOGRAPHY  02/10/2001   EF 60-65%    reports that he quit smoking about 45 years ago. His smoking use included cigarettes. He has a 20.00 pack-year smoking history. He has never used smokeless tobacco. He reports current alcohol use. He reports that he does not use drugs. family history includes Diabetes in his sister; Mental illness in his mother. Allergies  Allergen Reactions   Jardiance [Empagliflozin]     Yeast infections   Diphenhydramine-Phenylephrine Other (See Comments)   Lisinopril Swelling   Pseudoephedrine Hcl Other (See Comments)   Sudafed Pe Sinus Cong Day-Nght  [Diphenhydramine-Phenylephrine] Other (See Comments)   Triprolidine-Pse Rash   Actifed Cold-Sinus     rash   Pseudoephedrine     REACTION: hives   Triprolidine Hcl     Review of Systems  Constitutional:  Negative for chills, fever, malaise/fatigue and weight loss.  HENT:  Negative for hearing loss.   Eyes:  Negative  for blurred vision and double vision.  Respiratory:  Negative for cough and shortness of breath.   Cardiovascular:  Negative for chest pain, palpitations and leg swelling.  Gastrointestinal:  Negative for abdominal pain, blood in stool, constipation and diarrhea.  Genitourinary:  Negative for dysuria.  Skin:  Negative for rash.  Neurological:  Negative for dizziness, speech change, seizures, loss of consciousness, weakness and headaches.  Psychiatric/Behavioral:  Negative for depression.       Objective:     BP 138/82 (BP Location: Left Arm, Cuff Size: Normal)   Pulse 75   Temp 98.2 F (36.8 C)  (Oral)   Ht 6\' 2"  (1.88 m)   Wt 192 lb 12.8 oz (87.5 kg)   SpO2 98%   BMI 24.75 kg/m  BP Readings from Last 3 Encounters:  07/17/22 138/82  06/12/22 138/70  04/19/22 130/70   Wt Readings from Last 3 Encounters:  07/17/22 192 lb 12.8 oz (87.5 kg)  06/12/22 200 lb (90.7 kg)  04/24/22 194 lb (88 kg)      Physical Exam Vitals reviewed.  Constitutional:      General: He is not in acute distress.    Appearance: He is well-developed.  HENT:     Head: Normocephalic and atraumatic.     Right Ear: External ear normal.     Left Ear: External ear normal.  Eyes:     Conjunctiva/sclera: Conjunctivae normal.     Pupils: Pupils are equal, round, and reactive to light.  Neck:     Thyroid: No thyromegaly.  Cardiovascular:     Rate and Rhythm: Normal rate and regular rhythm.     Heart sounds: Normal heart sounds. No murmur heard. Pulmonary:     Effort: No respiratory distress.     Breath sounds: No wheezing or rales.  Abdominal:     General: Bowel sounds are normal. There is no distension.     Palpations: Abdomen is soft. There is no mass.     Tenderness: There is no abdominal tenderness. There is no guarding or rebound.  Musculoskeletal:     Cervical back: Normal range of motion and neck supple.  Lymphadenopathy:     Cervical: No cervical adenopathy.  Skin:    Findings: No rash.  Neurological:     Mental Status: He is alert and oriented to person, place, and time.     Cranial Nerves: No cranial nerve deficit.     Deep Tendon Reflexes: Reflexes normal.      No results found for any visits on 07/17/22.  Last CBC Lab Results  Component Value Date   WBC 6.6 02/07/2020   HGB 14.5 02/07/2020   HCT 44.0 02/07/2020   MCV 84.0 02/07/2020   RDW 14.2 02/07/2020   PLT 271.0 02/07/2020   Last metabolic panel Lab Results  Component Value Date   GLUCOSE 114 (H) 02/20/2022   NA 137 02/20/2022   K 4.0 02/20/2022   CL 101 02/20/2022   CO2 30 02/20/2022   BUN 17 02/20/2022    CREATININE 1.02 02/20/2022   GFR 71.93 02/20/2022   CALCIUM 10.1 02/20/2022   PROT 7.7 02/20/2022   ALBUMIN 4.4 02/20/2022   LABGLOB 2.6 03/12/2016   AGRATIO 1.7 03/12/2016   BILITOT 0.4 02/20/2022   ALKPHOS 37 (L) 02/20/2022   AST 16 02/20/2022   ALT 19 02/20/2022   Last lipids Lab Results  Component Value Date   CHOL 143 02/20/2022   HDL 39.40 02/20/2022   LDLCALC 79 02/20/2022  LDLDIRECT 84.0 06/11/2018   TRIG 126.0 02/20/2022   CHOLHDL 4 02/20/2022   Last hemoglobin A1c Lab Results  Component Value Date   HGBA1C 6.7 (A) 02/20/2022   Last thyroid functions Lab Results  Component Value Date   TSH 1.49 11/13/2021      The 10-year ASCVD risk score (Arnett DK, et al., 2019) is: 27.1%    Assessment & Plan:   Physical exam.  He has chronic problems as above.  Generally doing well.  We discussed the following health maintenance issues  -Recommend repeat A1c and urine microalbumin screen. -Did not check lipids or chemistry since these were checked in February and stable. -Continue yearly diabetic eye exam -Continue annual flu vaccine -Other vaccines up-to-date -Colonoscopy up-to-date -He has had previous PSAs through urology. -Try to step up regularity of exercise with minimum 150 minutes of moderate aerobic activity per week along with some mixed resistance training -Consider physical therapy referral for fall prevention exercises if he continues to feel off balance  Return in about 6 months (around 01/17/2023).    Evelena Peat, MD

## 2022-07-19 NOTE — Telephone Encounter (Signed)
Pt called, returning CMA's call. CMA was with a patient. Pt asked that CMA call back at his earliest convenience. 

## 2022-07-19 NOTE — Telephone Encounter (Signed)
Please see result note 

## 2022-07-20 DIAGNOSIS — M9901 Segmental and somatic dysfunction of cervical region: Secondary | ICD-10-CM | POA: Diagnosis not present

## 2022-07-20 DIAGNOSIS — M9905 Segmental and somatic dysfunction of pelvic region: Secondary | ICD-10-CM | POA: Diagnosis not present

## 2022-07-20 DIAGNOSIS — M5030 Other cervical disc degeneration, unspecified cervical region: Secondary | ICD-10-CM | POA: Diagnosis not present

## 2022-07-27 DIAGNOSIS — M9901 Segmental and somatic dysfunction of cervical region: Secondary | ICD-10-CM | POA: Diagnosis not present

## 2022-07-27 DIAGNOSIS — M5030 Other cervical disc degeneration, unspecified cervical region: Secondary | ICD-10-CM | POA: Diagnosis not present

## 2022-07-27 DIAGNOSIS — M9905 Segmental and somatic dysfunction of pelvic region: Secondary | ICD-10-CM | POA: Diagnosis not present

## 2022-08-03 DIAGNOSIS — M5030 Other cervical disc degeneration, unspecified cervical region: Secondary | ICD-10-CM | POA: Diagnosis not present

## 2022-08-03 DIAGNOSIS — M9905 Segmental and somatic dysfunction of pelvic region: Secondary | ICD-10-CM | POA: Diagnosis not present

## 2022-08-03 DIAGNOSIS — M9901 Segmental and somatic dysfunction of cervical region: Secondary | ICD-10-CM | POA: Diagnosis not present

## 2022-08-09 DIAGNOSIS — M5459 Other low back pain: Secondary | ICD-10-CM | POA: Diagnosis not present

## 2022-08-13 DIAGNOSIS — H16223 Keratoconjunctivitis sicca, not specified as Sjogren's, bilateral: Secondary | ICD-10-CM | POA: Diagnosis not present

## 2022-08-13 DIAGNOSIS — H2513 Age-related nuclear cataract, bilateral: Secondary | ICD-10-CM | POA: Diagnosis not present

## 2022-08-14 DIAGNOSIS — M5459 Other low back pain: Secondary | ICD-10-CM | POA: Diagnosis not present

## 2022-08-20 DIAGNOSIS — M5459 Other low back pain: Secondary | ICD-10-CM | POA: Diagnosis not present

## 2022-08-27 ENCOUNTER — Encounter: Payer: Self-pay | Admitting: Family Medicine

## 2022-08-27 DIAGNOSIS — M5459 Other low back pain: Secondary | ICD-10-CM | POA: Diagnosis not present

## 2022-09-03 DIAGNOSIS — M5459 Other low back pain: Secondary | ICD-10-CM | POA: Diagnosis not present

## 2022-09-06 ENCOUNTER — Encounter: Payer: Self-pay | Admitting: Family Medicine

## 2022-09-10 DIAGNOSIS — M5459 Other low back pain: Secondary | ICD-10-CM | POA: Diagnosis not present

## 2022-09-12 DIAGNOSIS — M2141 Flat foot [pes planus] (acquired), right foot: Secondary | ICD-10-CM | POA: Diagnosis not present

## 2022-09-12 DIAGNOSIS — M216X1 Other acquired deformities of right foot: Secondary | ICD-10-CM | POA: Diagnosis not present

## 2022-09-12 DIAGNOSIS — M2142 Flat foot [pes planus] (acquired), left foot: Secondary | ICD-10-CM | POA: Diagnosis not present

## 2022-09-12 DIAGNOSIS — M216X2 Other acquired deformities of left foot: Secondary | ICD-10-CM | POA: Diagnosis not present

## 2022-09-12 DIAGNOSIS — M722 Plantar fascial fibromatosis: Secondary | ICD-10-CM | POA: Diagnosis not present

## 2022-09-12 DIAGNOSIS — E119 Type 2 diabetes mellitus without complications: Secondary | ICD-10-CM | POA: Diagnosis not present

## 2022-09-18 ENCOUNTER — Encounter: Payer: Self-pay | Admitting: Family Medicine

## 2022-09-18 DIAGNOSIS — M5459 Other low back pain: Secondary | ICD-10-CM | POA: Diagnosis not present

## 2022-09-19 DIAGNOSIS — M5416 Radiculopathy, lumbar region: Secondary | ICD-10-CM | POA: Diagnosis not present

## 2022-09-25 ENCOUNTER — Other Ambulatory Visit: Payer: Self-pay | Admitting: Family Medicine

## 2022-09-25 DIAGNOSIS — M5459 Other low back pain: Secondary | ICD-10-CM | POA: Diagnosis not present

## 2022-10-02 DIAGNOSIS — M5459 Other low back pain: Secondary | ICD-10-CM | POA: Diagnosis not present

## 2022-10-08 DIAGNOSIS — M5459 Other low back pain: Secondary | ICD-10-CM | POA: Diagnosis not present

## 2022-10-16 ENCOUNTER — Ambulatory Visit: Payer: Medicare Other | Admitting: Family Medicine

## 2022-10-18 DIAGNOSIS — M5459 Other low back pain: Secondary | ICD-10-CM | POA: Diagnosis not present

## 2022-10-22 ENCOUNTER — Ambulatory Visit: Payer: Medicare Other | Admitting: Family Medicine

## 2022-10-22 VITALS — BP 122/70 | HR 75 | Temp 98.3°F | Ht 74.0 in | Wt 193.4 lb

## 2022-10-22 DIAGNOSIS — E785 Hyperlipidemia, unspecified: Secondary | ICD-10-CM

## 2022-10-22 DIAGNOSIS — Z7984 Long term (current) use of oral hypoglycemic drugs: Secondary | ICD-10-CM | POA: Diagnosis not present

## 2022-10-22 DIAGNOSIS — I10 Essential (primary) hypertension: Secondary | ICD-10-CM | POA: Diagnosis not present

## 2022-10-22 DIAGNOSIS — E119 Type 2 diabetes mellitus without complications: Secondary | ICD-10-CM | POA: Diagnosis not present

## 2022-10-22 DIAGNOSIS — Z7985 Long-term (current) use of injectable non-insulin antidiabetic drugs: Secondary | ICD-10-CM

## 2022-10-22 LAB — POCT GLYCOSYLATED HEMOGLOBIN (HGB A1C): Hemoglobin A1C: 6.8 % — AB (ref 4.0–5.6)

## 2022-10-22 NOTE — Patient Instructions (Signed)
A1C today 6.8%  Keep up the good work!    Let's plan on 3 month follow up.

## 2022-10-22 NOTE — Progress Notes (Signed)
Established Patient Office Visit  Subjective   Patient ID: Todd [name], male    DOB: 18-Apr-1945  Age: 77 y.o. MRN: [id number]  No chief complaint on file.   HPI   Alger is seen for medical follow-up.  Diabetes currently treated with metformin and Mounjaro 5 mg subcutaneous once weekly.  Tolerating well.  No significant side effects from [facility].  He had previous intolerance with Jardiance. Last A1c 7.2%.  He has been having some balance difficulties and has been seen by physical therapy and feels like exercises are helping somewhat.  He has no significant neuropathy issues impairing balance.  Hyperlipidemia treated with rosuvastatin and also takes fenofibrate for hypertriglyceridemia.  Denies any myalgias.  No recent chest pains.  Longstanding history of occasional palpitations.  Past history of PVCs.  Lipids were checked last February.  Past Medical History:  Diagnosis Date   DIABETES MELLITUS, TYPE II 08/08/2009   Hiatal hernia    History of nuclear stress test    Myoview 02/2019: EF 54, apical inf and apical artifact, normal perfusion, low risk   HYPERLIPIDEMIA 08/08/2009   Palpitations    URINARY INCONTINENCE 08/08/2009   Past Surgical History:  Procedure Laterality Date   ACHILLES TENDON REPAIR  01/08/2000   rupture   APPENDECTOMY  01/07/1974   CARDIOVASCULAR STRESS TEST  11/02/2003   EF 57%   MENISCUS REPAIR Left    TONSILLECTOMY  01/08/1959   US ECHOCARDIOGRAPHY  02/10/2001   EF 60-65%    reports that he quit smoking about 45 years ago. His smoking use included cigarettes. He started smoking about 55 years ago. He has a 20 pack-year smoking history. He has never used smokeless tobacco. He reports current alcohol use. He reports that he does not use drugs. family history includes Diabetes in his sister; Mental illness in his mother. Allergies  Allergen Reactions   Jardiance [Empagliflozin]     Yeast infections   Diphenhydramine-Phenylephrine Other (See Comments)    Lisinopril Swelling   Pseudoephedrine Hcl Other (See Comments)   Sudafed Pe Sinus Cong Day-Nght  [Diphenhydramine-Phenylephrine] Other (See Comments)   Triprolidine-Pse Rash   Actifed Cold-Sinus     rash   Pseudoephedrine     REACTION: hives   Triprolidine Hcl       Review of Systems  Constitutional:  Negative for malaise/fatigue.  Eyes:  Negative for blurred vision.  Respiratory:  Negative for shortness of breath.   Cardiovascular:  Negative for chest pain.  Neurological:  Negative for dizziness, weakness and headaches.      Objective:     BP 122/70 (BP Location: Left Arm, Cuff Size: Normal)   Pulse 75   Temp 98.3 F (36.8 C) (Oral)   Ht 6\' 2"  (1.88 m)   Wt 193 lb 6.4 oz (87.7 kg)   SpO2 98%   BMI 24.83 kg/m  BP Readings from Last 3 Encounters:  10/22/22 122/70  07/17/22 138/82  06/12/22 138/70   Wt Readings from Last 3 Encounters:  10/22/22 193 lb 6.4 oz (87.7 kg)  07/17/22 192 lb 12.8 oz (87.5 kg)  06/12/22 200 lb (90.7 kg)      Physical Exam Vitals reviewed.  Constitutional:      Appearance: Normal appearance.  Cardiovascular:     Rate and Rhythm: Normal rate and regular rhythm.  Pulmonary:     Effort: Pulmonary effort is normal.     Breath sounds: Normal breath sounds. No wheezing or rales.  Musculoskeletal:     Right  lower leg: No edema.     Left lower leg: No edema.  Neurological:     Mental Status: He is alert.      Results for orders placed or performed in visit on 10/22/22  POC HgB A1c  Result Value Ref Range   Hemoglobin A1C 6.8 (A) 4.0 - 5.6 %   HbA1c POC (<> result, manual entry)     HbA1c, POC (prediabetic range)     HbA1c, POC (controlled diabetic range)      Last CBC Lab Results  Component Value Date   WBC 6.6 02/07/2020   HGB 14.5 02/07/2020   HCT 44.0 02/07/2020   MCV 84.0 02/07/2020   RDW 14.2 02/07/2020   PLT 271.0 02/07/2020   Last metabolic panel Lab Results  Component Value Date   GLUCOSE 114 (H) 02/20/2022    NA 137 02/20/2022   K 4.0 02/20/2022   CL 101 02/20/2022   CO2 30 02/20/2022   BUN 17 02/20/2022   CREATININE 1.02 02/20/2022   GFR 71.93 02/20/2022   CALCIUM 10.1 02/20/2022   PROT 7.7 02/20/2022   ALBUMIN 4.4 02/20/2022   LABGLOB 2.6 03/12/2016   AGRATIO 1.7 03/12/2016   BILITOT 0.4 02/20/2022   ALKPHOS 37 (L) 02/20/2022   AST 16 02/20/2022   ALT 19 02/20/2022   Last lipids Lab Results  Component Value Date   CHOL 143 02/20/2022   HDL 39.40 02/20/2022   LDLCALC 79 02/20/2022   LDLDIRECT 84.0 06/11/2018   TRIG 126.0 02/20/2022   CHOLHDL 4 02/20/2022   Last hemoglobin A1c Lab Results  Component Value Date   HGBA1C 6.8 (A) 10/22/2022      The 10-year ASCVD risk score (Arnett DK, et al., 2019) is: 23%    Assessment & Plan:   #1 type 2 diabetes improved with A1c 6.8%.  Continue current dose of Mounjaro and metformin.  Continue yearly diabetic eye exam.  Set up 70-month follow-up.  Immunizations up-to-date  #2 hyperlipidemia treated with rosuvastatin 10 mg daily.  Also takes fenofibrate.  Recheck lipids at next follow-up   Return in about 3 months (around 01/22/2023).    Evelena Peat, MD

## 2022-10-25 DIAGNOSIS — M5459 Other low back pain: Secondary | ICD-10-CM | POA: Diagnosis not present

## 2022-10-28 ENCOUNTER — Telehealth: Payer: Self-pay

## 2022-10-28 NOTE — Progress Notes (Signed)
Pharmacy Quality Measure Review  This patient is appearing on a report for being at risk of failing the adherence measure for cholesterol (statin) medications this calendar year.   Medication: Rosuvastatin Last fill date: 07/21/22 for 100 day supply  Left voicemail for patient to return my call at their convenience.  Sherrill Raring, PharmD Clinical Pharmacist [phone]

## 2022-11-01 DIAGNOSIS — M5459 Other low back pain: Secondary | ICD-10-CM | POA: Diagnosis not present

## 2022-11-05 ENCOUNTER — Encounter: Payer: Self-pay | Admitting: Pharmacist

## 2022-11-05 NOTE — Progress Notes (Signed)
Pharmacy Quality Measure Review  This patient is appearing on a report for being at risk of failing the adherence measure for cholesterol (statin) medications this calendar year.   Medication: rosuvastatin 10 mg Last fill date: 10/23 for 100 day supply  Insurance report was not up to date. No action needed at this time.   Jarrett Ables, PharmD PGY-1 Pharmacy Resident

## 2022-11-08 DIAGNOSIS — M5459 Other low back pain: Secondary | ICD-10-CM | POA: Diagnosis not present

## 2022-11-14 DIAGNOSIS — M5459 Other low back pain: Secondary | ICD-10-CM | POA: Diagnosis not present

## 2022-11-19 ENCOUNTER — Ambulatory Visit: Payer: Medicare Other | Admitting: Family Medicine

## 2022-11-19 ENCOUNTER — Encounter: Payer: Self-pay | Admitting: Family Medicine

## 2022-11-19 VITALS — BP 130/70 | HR 63 | Temp 97.7°F | Ht 74.0 in | Wt 196.3 lb

## 2022-11-19 DIAGNOSIS — R300 Dysuria: Secondary | ICD-10-CM | POA: Diagnosis not present

## 2022-11-19 DIAGNOSIS — R2689 Other abnormalities of gait and mobility: Secondary | ICD-10-CM | POA: Diagnosis not present

## 2022-11-19 LAB — POC URINALSYSI DIPSTICK (AUTOMATED)
Bilirubin, UA: NEGATIVE
Blood, UA: NEGATIVE
Glucose, UA: NEGATIVE
Ketones, UA: NEGATIVE
Nitrite, UA: POSITIVE
Protein, UA: NEGATIVE
Spec Grav, UA: 1.015 (ref 1.010–1.025)
Urobilinogen, UA: 0.2 U/dL
pH, UA: 7 (ref 5.0–8.0)

## 2022-11-19 MED ORDER — CEPHALEXIN 500 MG PO CAPS: 500.0000 mg | ORAL_CAPSULE | Freq: Three times a day (TID) | ORAL | 0 refills | Status: DC

## 2022-11-19 NOTE — Patient Instructions (Signed)
Suspect possible UTI (urinary tract infection) based on findings of positive leukocytes and nitrites in urine.    Start the antibiotics  Drink plenty of fluids  We will call with the urine culture results.

## 2022-11-19 NOTE — Progress Notes (Signed)
Established Patient Office Visit  Subjective   Patient ID: Todd [name], male    DOB: Jan 16, 1945  Age: 77 y.o. MRN: [id number]  Chief Complaint  Patient presents with   Abnormal urine odor    HPI   Todd [name] is seen with "abnormal urine odor "past couple weeks.  This has been noticed predominately by his wife.  She also thought his breath was different.  He has not noted any color changes or any burning with urination.  No fevers or chills.  No hematuria.  No hx of ketosis.   Recent A1C 6.8 and CBGs stable.   He has had some ongoing balance issues.  Currently getting some physical therapy which he thinks is helping some.  No vertigo.  He does have longstanding history of type 2 diabetes but no significant neuropathy.  He has lost some weight since taking Mounjaro and feels like he may have had a little bit of muscle mass loss as well.  No syncope.  Denies any ataxia.  No focal weakness.  He does have essential tremor which could be affecting his balance somewhat  Past Medical History:  Diagnosis Date   DIABETES MELLITUS, TYPE II 08/08/2009   Hiatal hernia    History of nuclear stress test    Myoview 02/2019: EF 54, apical inf and apical artifact, normal perfusion, low risk   HYPERLIPIDEMIA 08/08/2009   Palpitations    URINARY INCONTINENCE 08/08/2009   Past Surgical History:  Procedure Laterality Date   ACHILLES TENDON REPAIR  01/08/2000   rupture   APPENDECTOMY  01/07/1974   CARDIOVASCULAR STRESS TEST  11/02/2003   EF 57%   MENISCUS REPAIR Left    TONSILLECTOMY  01/08/1959   US ECHOCARDIOGRAPHY  02/10/2001   EF 60-65%    reports that he quit smoking about 45 years ago. His smoking use included cigarettes. He started smoking about 55 years ago. He has a 20 pack-year smoking history. He has never used smokeless tobacco. He reports current alcohol use. He reports that he does not use drugs. family history includes Diabetes in his sister; Mental illness in his mother. Allergies  Allergen  Reactions   Jardiance [Empagliflozin]     Yeast infections   Diphenhydramine-Phenylephrine Other (See Comments)   Lisinopril Swelling   Pseudoephedrine Hcl Other (See Comments)   Sudafed Pe Sinus Cong Day-Nght  [Diphenhydramine-Phenylephrine] Other (See Comments)   Triprolidine-Pse Rash   Actifed Cold-Sinus     rash   Pseudoephedrine     REACTION: hives   Triprolidine Hcl     Review of Systems  Constitutional:  Negative for chills, fever and malaise/fatigue.  Eyes:  Negative for blurred vision.  Respiratory:  Negative for shortness of breath.   Cardiovascular:  Negative for chest pain.  Gastrointestinal:  Negative for abdominal pain.  Genitourinary:  Negative for dysuria and hematuria.  Neurological:  Negative for dizziness, weakness and headaches.      Objective:     BP 130/70 (BP Location: Left Arm, Patient Position: Sitting, Cuff Size: Normal)   Pulse 63   Temp 97.7 F (36.5 C) (Oral)   Ht 6\' 2"  (1.88 m)   Wt 196 lb 4.8 oz (89 kg)   SpO2 98%   BMI 25.20 kg/m  BP Readings from Last 3 Encounters:  11/19/22 130/70  10/22/22 122/70  07/17/22 138/82   Wt Readings from Last 3 Encounters:  11/19/22 196 lb 4.8 oz (89 kg)  10/22/22 193 lb 6.4 oz (87.7 kg)  07/17/22 192  lb 12.8 oz (87.5 kg)      Physical Exam Vitals reviewed.  Constitutional:      Appearance: He is well-developed.  Eyes:     Pupils: Pupils are equal, round, and reactive to light.  Neck:     Thyroid: No thyromegaly.  Cardiovascular:     Rate and Rhythm: Normal rate and regular rhythm.  Pulmonary:     Effort: Pulmonary effort is normal. No respiratory distress.     Breath sounds: Normal breath sounds. No wheezing or rales.  Musculoskeletal:     Cervical back: Neck supple.  Neurological:     Mental Status: He is alert and oriented to person, place, and time.     Comments: Monofilament testing is normal in both feet.  No evidence for any foot callus.      No results found for any visits on  11/19/22.  Last CBC Lab Results  Component Value Date   WBC 6.6 02/07/2020   HGB 14.5 02/07/2020   HCT 44.0 02/07/2020   MCV 84.0 02/07/2020   RDW 14.2 02/07/2020   PLT 271.0 02/07/2020   Last metabolic panel Lab Results  Component Value Date   GLUCOSE 114 (H) 02/20/2022   NA 137 02/20/2022   K 4.0 02/20/2022   CL 101 02/20/2022   CO2 30 02/20/2022   BUN 17 02/20/2022   CREATININE 1.02 02/20/2022   GFR 71.93 02/20/2022   CALCIUM 10.1 02/20/2022   PROT 7.7 02/20/2022   ALBUMIN 4.4 02/20/2022   LABGLOB 2.6 03/12/2016   AGRATIO 1.7 03/12/2016   BILITOT 0.4 02/20/2022   ALKPHOS 37 (L) 02/20/2022   AST 16 02/20/2022   ALT 19 02/20/2022   Last lipids Lab Results  Component Value Date   CHOL 143 02/20/2022   HDL 39.40 02/20/2022   LDLCALC 79 02/20/2022   LDLDIRECT 84.0 06/11/2018   TRIG 126.0 02/20/2022   CHOLHDL 4 02/20/2022   Last hemoglobin A1c Lab Results  Component Value Date   HGBA1C 6.8 (A) 10/22/2022   Last vitamin B12 and Folate Lab Results  Component Value Date   VITAMINB12 448 10/01/2010      The 10-year ASCVD risk score (Arnett DK, et al., 2019) is: 25.4%    Assessment & Plan:   #1 abnormal urine odor.  No recent change in medications.  No new supplements.  No dietary changes. Urine dipstick does reveal positive nitrites and leukocytes. -Send urine culture -Stay well-hydrated -Start Keflex 500 mg 3 times daily for 7 days pending culture results  #2 balance concerns.  No evidence for significant neuropathy.  He does have history of essential tremor but no significant head or neck involvement and doubt this is contributing significantly.  He has lost some significant muscle mass and has some generalized weakness which may be contributing.  Concur with his current physical therapy and especially recommend focusing on good strengthening exercises for lower extremities.  He is doing several balance exercises at home  Evelena Peat, MD

## 2022-11-21 ENCOUNTER — Telehealth: Payer: Self-pay | Admitting: Family Medicine

## 2022-11-21 LAB — URINE CULTURE
MICRO NUMBER:: 15719020
SPECIMEN QUALITY:: ADEQUATE

## 2022-11-21 NOTE — Telephone Encounter (Signed)
Pt is returning Todd [name] call 

## 2022-11-21 NOTE — Telephone Encounter (Signed)
Please see result note 

## 2022-11-22 DIAGNOSIS — M5459 Other low back pain: Secondary | ICD-10-CM | POA: Diagnosis not present

## 2022-11-29 DIAGNOSIS — M5459 Other low back pain: Secondary | ICD-10-CM | POA: Diagnosis not present

## 2022-12-03 ENCOUNTER — Ambulatory Visit (INDEPENDENT_AMBULATORY_CARE_PROVIDER_SITE_OTHER): Payer: Medicare Other | Admitting: Family Medicine

## 2022-12-03 ENCOUNTER — Encounter: Payer: Self-pay | Admitting: Family Medicine

## 2022-12-03 VITALS — BP 140/78 | HR 85 | Temp 97.9°F | Ht 74.0 in | Wt 196.4 lb

## 2022-12-03 DIAGNOSIS — R300 Dysuria: Secondary | ICD-10-CM

## 2022-12-03 DIAGNOSIS — N390 Urinary tract infection, site not specified: Secondary | ICD-10-CM | POA: Diagnosis not present

## 2022-12-03 LAB — POC URINALSYSI DIPSTICK (AUTOMATED)
Bilirubin, UA: NEGATIVE
Blood, UA: NEGATIVE
Glucose, UA: NEGATIVE
Ketones, UA: NEGATIVE
Nitrite, UA: POSITIVE
Protein, UA: NEGATIVE
Spec Grav, UA: 1.015 (ref 1.010–1.025)
Urobilinogen, UA: 0.2 U/dL
pH, UA: 6 (ref 5.0–8.0)

## 2022-12-03 NOTE — Progress Notes (Signed)
Established Patient Office Visit  Subjective   Patient ID: Todd [name], male    DOB: 03/28/1945  Age: 77 y.o. MRN: [id number]  Chief Complaint  Patient presents with   Follow-up    HPI   Todd [name] was seen recently with UTI.  He presented that time basically was just abnormal odor of urine.  His dipstick showed nitrites and leukocytes and culture came back positive for E. coli.  He had no fever.  Interestingly had no burning with urination.  Was treated with Keflex and off antibiotics at this time.  Has not noticed any abnormal odor or other changes at this point.  He does have type 2 diabetes but no longer takes [place].  No history of UTI.  No obstructive urinary symptoms.  No fevers or chills.  Past Medical History:  Diagnosis Date   DIABETES MELLITUS, TYPE II 08/08/2009   Hiatal hernia    History of nuclear stress test    Myoview 02/2019: EF 54, apical inf and apical artifact, normal perfusion, low risk   HYPERLIPIDEMIA 08/08/2009   Palpitations    URINARY INCONTINENCE 08/08/2009   Past Surgical History:  Procedure Laterality Date   ACHILLES TENDON REPAIR  01/08/2000   rupture   APPENDECTOMY  01/07/1974   CARDIOVASCULAR STRESS TEST  11/02/2003   EF 57%   MENISCUS REPAIR Left    TONSILLECTOMY  01/08/1959   US ECHOCARDIOGRAPHY  02/10/2001   EF 60-65%    reports that he quit smoking about 45 years ago. His smoking use included cigarettes. He started smoking about 55 years ago. He has a 20 pack-year smoking history. He has never used smokeless tobacco. He reports current alcohol use. He reports that he does not use drugs. family history includes Diabetes in his sister; Mental illness in his mother. Allergies  Allergen Reactions   Jardiance [Empagliflozin]     Yeast infections   Diphenhydramine-Phenylephrine Other (See Comments)   Lisinopril Swelling   Pseudoephedrine Hcl Other (See Comments)   Sudafed Pe Sinus Cong Day-Nght  [Diphenhydramine-Phenylephrine] Other (See  Comments)   Triprolidine-Pse Rash   Actifed Cold-Sinus     rash   Pseudoephedrine     REACTION: hives   Triprolidine Hcl     Review of Systems  Constitutional:  Negative for chills and fever.  Genitourinary:  Negative for dysuria, flank pain, frequency, hematuria and urgency.      Objective:     BP (!) 140/78 (BP Location: Left Arm, Patient Position: Sitting, Cuff Size: Normal)   Pulse 85   Temp 97.9 F (36.6 C) (Oral)   Ht 6\' 2"  (1.88 m)   Wt 196 lb 6.4 oz (89.1 kg)   SpO2 98%   BMI 25.22 kg/m  BP Readings from Last 3 Encounters:  12/03/22 (!) 140/78  11/19/22 130/70  10/22/22 122/70   Wt Readings from Last 3 Encounters:  12/03/22 196 lb 6.4 oz (89.1 kg)  11/19/22 196 lb 4.8 oz (89 kg)  10/22/22 193 lb 6.4 oz (87.7 kg)      Physical Exam Vitals reviewed.  Constitutional:      General: He is not in acute distress.    Appearance: He is not ill-appearing.  Cardiovascular:     Rate and Rhythm: Normal rate and regular rhythm.  Pulmonary:     Effort: Pulmonary effort is normal.     Breath sounds: Normal breath sounds. No wheezing or rales.  Neurological:     Mental Status: He is alert.  No results found for any visits on 12/03/22.  Last CBC Lab Results  Component Value Date   WBC 6.6 02/07/2020   HGB 14.5 02/07/2020   HCT 44.0 02/07/2020   MCV 84.0 02/07/2020   RDW 14.2 02/07/2020   PLT 271.0 02/07/2020   Last metabolic panel Lab Results  Component Value Date   GLUCOSE 114 (H) 02/20/2022   NA 137 02/20/2022   K 4.0 02/20/2022   CL 101 02/20/2022   CO2 30 02/20/2022   BUN 17 02/20/2022   CREATININE 1.02 02/20/2022   GFR 71.93 02/20/2022   CALCIUM 10.1 02/20/2022   PROT 7.7 02/20/2022   ALBUMIN 4.4 02/20/2022   LABGLOB 2.6 03/12/2016   AGRATIO 1.7 03/12/2016   BILITOT 0.4 02/20/2022   ALKPHOS 37 (L) 02/20/2022   AST 16 02/20/2022   ALT 19 02/20/2022   Last lipids Lab Results  Component Value Date   CHOL 143 02/20/2022   HDL  39.40 02/20/2022   LDLCALC 79 02/20/2022   LDLDIRECT 84.0 06/11/2018   TRIG 126.0 02/20/2022   CHOLHDL 4 02/20/2022   Last hemoglobin A1c Lab Results  Component Value Date   HGBA1C 6.8 (A) 10/22/2022      The 10-year ASCVD risk score (Arnett DK, et al., 2019) is: 28.4%    Assessment & Plan:   Recent UTI.  Culture was positive for E. coli.  He was minimally symptomatic.  Treated with Keflex and totally asymptomatic at this time.  Recheck urinalysis.  Urine dipstick does show positive nitrites again with trace leukocytes.  Urine culture will be sent.  Follow-up immediately for any recurrent dysuria, fever, or other concerns   No follow-ups on file.    Evelena Peat, MD

## 2022-12-05 LAB — URINE CULTURE
MICRO NUMBER:: 15781759
SPECIMEN QUALITY:: ADEQUATE

## 2022-12-06 ENCOUNTER — Encounter: Payer: Self-pay | Admitting: Family Medicine

## 2022-12-09 DIAGNOSIS — M5459 Other low back pain: Secondary | ICD-10-CM | POA: Diagnosis not present

## 2022-12-09 MED ORDER — CEPHALEXIN 500 MG PO CAPS: 500.0000 mg | ORAL_CAPSULE | Freq: Three times a day (TID) | ORAL | 0 refills | Status: AC

## 2022-12-09 NOTE — Addendum Note (Signed)
Addended by: Christy Sartorius on: 12/09/2022 09:15 AM   Modules accepted: Orders

## 2022-12-09 NOTE — Telephone Encounter (Signed)
Please see result note 

## 2022-12-09 NOTE — Addendum Note (Signed)
Addended by: Christy Sartorius on: 12/09/2022 09:17 AM   Modules accepted: Orders

## 2022-12-18 DIAGNOSIS — M5459 Other low back pain: Secondary | ICD-10-CM | POA: Diagnosis not present

## 2022-12-24 ENCOUNTER — Ambulatory Visit (INDEPENDENT_AMBULATORY_CARE_PROVIDER_SITE_OTHER): Payer: Medicare Other | Admitting: Family Medicine

## 2022-12-24 ENCOUNTER — Encounter: Payer: Self-pay | Admitting: Family Medicine

## 2022-12-24 VITALS — BP 136/74 | HR 85 | Temp 98.2°F | Ht 74.0 in | Wt 198.0 lb

## 2022-12-24 DIAGNOSIS — R300 Dysuria: Secondary | ICD-10-CM

## 2022-12-24 DIAGNOSIS — E119 Type 2 diabetes mellitus without complications: Secondary | ICD-10-CM

## 2022-12-24 DIAGNOSIS — M5416 Radiculopathy, lumbar region: Secondary | ICD-10-CM | POA: Diagnosis not present

## 2022-12-24 LAB — POC URINALSYSI DIPSTICK (AUTOMATED)
Bilirubin, UA: NEGATIVE
Blood, UA: NEGATIVE
Glucose, UA: NEGATIVE
Ketones, UA: NEGATIVE
Leukocytes, UA: NEGATIVE
Nitrite, UA: NEGATIVE
Protein, UA: NEGATIVE
Spec Grav, UA: 1.01 (ref 1.010–1.025)
Urobilinogen, UA: 0.2 U/dL
pH, UA: 7 (ref 5.0–8.0)

## 2022-12-24 NOTE — Patient Instructions (Addendum)
Urinalysis is now clear  Follow up for any fever or recurrent urinary symptoms.  a

## 2022-12-24 NOTE — Progress Notes (Signed)
Established Patient Office Visit  Subjective   Patient ID: Todd [name], male    DOB: 28-Jul-1945  Age: 77 y.o. MRN: [id number]  No chief complaint on file.   HPI   Todd [name] is seen for follow-up regarding recent urinary tract infections.  Urine culture grew out E. coli on 11-19-2022.  At that time, his wife had noted foul smell of urine but he actually had no burning with urination and no fevers or chills.  He was treated with Keflex at that time and completed therapy.  He then came back for follow-up in a couple weeks and had a repeat culture done on the 26 which came back positive again for E. coli.  We again treated with cephalexin 500 mg 3 times daily for 10 days.  He has no urinary symptoms whatsoever at this time.  No recent fever.  No flank pain.  No obstructive urinary symptoms.  Takes Vesicare for urine urgency.  Does not take SGLT2 medications any longer.  No recent bladder instrumentation.  Type 2 diabetes with fair control with recent A1c 6.8% on Mounjaro.  Generally doing well.  Occasional dry mouth.  He has battled some chronic constipation.  Does take Vesicare as above.  Past Medical History:  Diagnosis Date   DIABETES MELLITUS, TYPE II 08/08/2009   Hiatal hernia    History of nuclear stress test    Myoview 02/2019: EF 54, apical inf and apical artifact, normal perfusion, low risk   HYPERLIPIDEMIA 08/08/2009   Palpitations    URINARY INCONTINENCE 08/08/2009   Past Surgical History:  Procedure Laterality Date   ACHILLES TENDON REPAIR  01/08/2000   rupture   APPENDECTOMY  01/07/1974   CARDIOVASCULAR STRESS TEST  11/02/2003   EF 57%   MENISCUS REPAIR Left    TONSILLECTOMY  01/08/1959   US ECHOCARDIOGRAPHY  02/10/2001   EF 60-65%    reports that he quit smoking about 45 years ago. His smoking use included cigarettes. He started smoking about 55 years ago. He has a 20 pack-year smoking history. He has never used smokeless tobacco. He reports current alcohol use. He reports that  he does not use drugs. family history includes Diabetes in his sister; Mental illness in his mother. Allergies  Allergen Reactions   Jardiance [Empagliflozin]     Yeast infections   Diphenhydramine-Phenylephrine Other (See Comments)   Lisinopril Swelling   Pseudoephedrine Hcl Other (See Comments)   Sudafed Pe Sinus Cong Day-Nght  [Diphenhydramine-Phenylephrine] Other (See Comments)   Triprolidine-Pse Rash   Actifed Cold-Sinus     rash   Pseudoephedrine     REACTION: hives   Triprolidine Hcl     Review of Systems  Constitutional:  Negative for chills and fever.  Genitourinary:  Negative for dysuria, flank pain, frequency and hematuria.      Objective:     BP 136/74 (BP Location: Left Arm, Cuff Size: Normal)   Pulse 85   Temp 98.2 F (36.8 C) (Oral)   Ht 6\' 2"  (1.88 m)   Wt 198 lb (89.8 kg)   SpO2 98%   BMI 25.42 kg/m  BP Readings from Last 3 Encounters:  12/24/22 136/74  12/03/22 (!) 140/78  11/19/22 130/70   Wt Readings from Last 3 Encounters:  12/24/22 198 lb (89.8 kg)  12/03/22 196 lb 6.4 oz (89.1 kg)  11/19/22 196 lb 4.8 oz (89 kg)      Physical Exam Vitals reviewed.  Constitutional:      General: He is  not in acute distress.    Appearance: He is not ill-appearing.  Cardiovascular:     Rate and Rhythm: Normal rate and regular rhythm.  Pulmonary:     Effort: Pulmonary effort is normal.     Breath sounds: Normal breath sounds. No wheezing or rales.  Neurological:     Mental Status: He is alert.      Results for orders placed or performed in visit on 12/24/22  POCT Urinalysis Dipstick (Automated)  Result Value Ref Range   Color, UA Yellow    Clarity, UA Clear    Glucose, UA Negative Negative   Bilirubin, UA Negative    Ketones, UA Negative    Spec Grav, UA 1.010 1.010 - 1.025   Blood, UA Negative    pH, UA 7.0 5.0 - 8.0   Protein, UA Negative Negative   Urobilinogen, UA 0.2 0.2 or 1.0 E.U./dL   Nitrite, UA Negative    Leukocytes, UA  Negative Negative    Last hemoglobin A1c Lab Results  Component Value Date   HGBA1C 6.8 (A) 10/22/2022      The 10-year ASCVD risk score (Arnett DK, et al., 2019) is: 27.2%    Assessment & Plan:   #1 recent urinary tract infection with E. coli.  Patient asymptomatic at this time.  Urine dipstick today is completely clear.  2 previous positive cultures associated with urine dipsticks showing positive nitrites and leukocytes and both clear today.  Stay well-hydrated.  Follow-up promptly for any dysuria, fever, or other concerns  #2 elevated blood pressure.  Mildly elevated when he first got here and repeat Pete reading was slightly improved.  Follow-up in January and reassess at that time   No follow-ups on file.    Evelena Peat, MD

## 2022-12-25 DIAGNOSIS — M5459 Other low back pain: Secondary | ICD-10-CM | POA: Diagnosis not present

## 2022-12-31 DIAGNOSIS — R2681 Unsteadiness on feet: Secondary | ICD-10-CM | POA: Diagnosis not present

## 2022-12-31 DIAGNOSIS — M5459 Other low back pain: Secondary | ICD-10-CM | POA: Diagnosis not present

## 2023-01-07 ENCOUNTER — Other Ambulatory Visit: Payer: Self-pay | Admitting: Cardiovascular Disease

## 2023-01-09 DIAGNOSIS — M5459 Other low back pain: Secondary | ICD-10-CM | POA: Diagnosis not present

## 2023-01-15 DIAGNOSIS — R2681 Unsteadiness on feet: Secondary | ICD-10-CM | POA: Diagnosis not present

## 2023-01-15 DIAGNOSIS — M5459 Other low back pain: Secondary | ICD-10-CM | POA: Diagnosis not present

## 2023-01-21 DIAGNOSIS — M5459 Other low back pain: Secondary | ICD-10-CM | POA: Diagnosis not present

## 2023-01-21 DIAGNOSIS — R2681 Unsteadiness on feet: Secondary | ICD-10-CM | POA: Diagnosis not present

## 2023-01-22 ENCOUNTER — Ambulatory Visit: Payer: Medicare Other | Admitting: Family Medicine

## 2023-01-22 ENCOUNTER — Encounter: Payer: Self-pay | Admitting: Family Medicine

## 2023-01-22 ENCOUNTER — Ambulatory Visit (INDEPENDENT_AMBULATORY_CARE_PROVIDER_SITE_OTHER): Payer: Medicare Other | Admitting: Family Medicine

## 2023-01-22 VITALS — BP 142/80 | HR 69 | Temp 97.7°F | Wt 191.9 lb

## 2023-01-22 DIAGNOSIS — H16223 Keratoconjunctivitis sicca, not specified as Sjogren's, bilateral: Secondary | ICD-10-CM | POA: Diagnosis not present

## 2023-01-22 DIAGNOSIS — N390 Urinary tract infection, site not specified: Secondary | ICD-10-CM | POA: Diagnosis not present

## 2023-01-22 DIAGNOSIS — H35373 Puckering of macula, bilateral: Secondary | ICD-10-CM | POA: Diagnosis not present

## 2023-01-22 DIAGNOSIS — E119 Type 2 diabetes mellitus without complications: Secondary | ICD-10-CM

## 2023-01-22 DIAGNOSIS — E785 Hyperlipidemia, unspecified: Secondary | ICD-10-CM | POA: Diagnosis not present

## 2023-01-22 DIAGNOSIS — I10 Essential (primary) hypertension: Secondary | ICD-10-CM | POA: Diagnosis not present

## 2023-01-22 DIAGNOSIS — Z7985 Long-term (current) use of injectable non-insulin antidiabetic drugs: Secondary | ICD-10-CM

## 2023-01-22 DIAGNOSIS — Z7984 Long term (current) use of oral hypoglycemic drugs: Secondary | ICD-10-CM | POA: Diagnosis not present

## 2023-01-22 LAB — COMPREHENSIVE METABOLIC PANEL
ALT: 18 U/L (ref 0–53)
AST: 13 U/L (ref 0–37)
Albumin: 4.6 g/dL (ref 3.5–5.2)
Alkaline Phosphatase: 39 U/L (ref 39–117)
BUN: 14 mg/dL (ref 6–23)
CO2: 31 meq/L (ref 19–32)
Calcium: 10 mg/dL (ref 8.4–10.5)
Chloride: 103 meq/L (ref 96–112)
Creatinine, Ser: 0.89 mg/dL (ref 0.40–1.50)
GFR: 83.33 mL/min (ref >60.00)
Glucose, Bld: 109 mg/dL — ABNORMAL HIGH (ref 70–99)
Potassium: 4 meq/L (ref 3.5–5.1)
Sodium: 142 meq/L (ref 135–145)
Total Bilirubin: 0.3 mg/dL (ref 0.2–1.2)
Total Protein: 7.4 g/dL (ref 6.0–8.3)

## 2023-01-22 LAB — LIPID PANEL
Cholesterol: 134 mg/dL (ref 0–200)
HDL: 43.7 mg/dL (ref >39.00)
LDL Cholesterol: 57 mg/dL (ref 0–99)
NonHDL: 90.4
Total CHOL/HDL Ratio: 3
Triglycerides: 165 mg/dL — ABNORMAL HIGH (ref 0.0–149.0)
VLDL: 33 mg/dL (ref 0.0–40.0)

## 2023-01-22 LAB — HM DIABETES EYE EXAM

## 2023-01-22 LAB — HEMOGLOBIN A1C: Hgb A1c MFr Bld: 6.9 % — ABNORMAL HIGH (ref 4.6–6.5)

## 2023-01-22 NOTE — Progress Notes (Signed)
 Established Patient Office Visit  Subjective   Patient ID: Todd [name], male    DOB: 20-Sep-1945  Age: 78 y.o. MRN: [id number]  Chief Complaint  Patient presents with   Medical Management of Chronic Issues    HPI   Todd [name] has history of hypertension, type 2 diabetes, dyslipidemia.  Here for medical follow-up.  Last A1c was 6.8% back in October.  He is due for follow-up labs with lipids and CMP.  Takes Crestor  10 mg daily and fenofibrate  160 mg daily.  Diabetes treated with Mounjaro  5 mg subcutaneous once weekly.  Blood sugars have been stable.  Blood pressure generally well-controlled, though up initially here today.  He has had some balance problems recently without any falls.  Currently seeing physical therapist and trying to do some core and lower extremity strengthening.  He feels like this is helping some. He does have history of essential tremor and has been seen by neurologist.  Currently not treated with medication.  Past Medical History:  Diagnosis Date   DIABETES MELLITUS, TYPE II 08/08/2009   Hiatal hernia    History of nuclear stress test    Myoview  02/2019: EF 54, apical inf and apical artifact, normal perfusion, low risk   HYPERLIPIDEMIA 08/08/2009   Palpitations    URINARY INCONTINENCE 08/08/2009   Past Surgical History:  Procedure Laterality Date   ACHILLES TENDON REPAIR  01/08/2000   rupture   APPENDECTOMY  01/07/1974   CARDIOVASCULAR STRESS TEST  11/02/2003   EF 57%   MENISCUS REPAIR Left    TONSILLECTOMY  01/08/1959   US  ECHOCARDIOGRAPHY  02/10/2001   EF 60-65%    reports that he quit smoking about 45 years ago. His smoking use included cigarettes. He started smoking about 55 years ago. He has a 20 pack-year smoking history. He has never used smokeless tobacco. He reports current alcohol use. He reports that he does not use drugs. family history includes Diabetes in his sister; Mental illness in his mother. Allergies  Allergen Reactions   Jardiance   [Empagliflozin ]     Yeast infections   Diphenhydramine-Phenylephrine Other (See Comments)   Lisinopril Swelling   Pseudoephedrine Hcl Other (See Comments)   Sudafed Pe Sinus Cong Day-Nght  [Diphenhydramine-Phenylephrine] Other (See Comments)   Triprolidine-Pse Rash   Actifed Cold-Sinus     rash   Pseudoephedrine     REACTION: hives   Triprolidine Hcl     Review of Systems  Constitutional:  Negative for chills, fever and malaise/fatigue.  Eyes:  Negative for blurred vision.  Respiratory:  Negative for shortness of breath.   Cardiovascular:  Negative for chest pain.  Neurological:  Positive for tremors. Negative for dizziness, focal weakness and headaches.      Objective:     BP (!) 142/80 (BP Location: Left Arm, Cuff Size: Normal)   Pulse 69   Temp 97.7 F (36.5 C) (Oral)   Wt 191 lb 14.4 oz (87 kg)   SpO2 95%   BMI 24.64 kg/m  BP Readings from Last 3 Encounters:  01/22/23 (!) 142/80  12/24/22 136/74  12/03/22 (!) 140/78   Wt Readings from Last 3 Encounters:  01/22/23 191 lb 14.4 oz (87 kg)  12/24/22 198 lb (89.8 kg)  12/03/22 196 lb 6.4 oz (89.1 kg)      Physical Exam Vitals reviewed.  Constitutional:      General: He is not in acute distress.    Appearance: He is not ill-appearing.  Cardiovascular:     Rate  and Rhythm: Normal rate and regular rhythm.  Pulmonary:     Effort: Pulmonary effort is normal.     Breath sounds: Normal breath sounds.  Neurological:     General: No focal deficit present.     Mental Status: He is alert.     Cranial Nerves: No cranial nerve deficit.      No results found for any visits on 01/22/23.  Last CBC Lab Results  Component Value Date   WBC 6.6 02/07/2020   HGB 14.5 02/07/2020   HCT 44.0 02/07/2020   MCV 84.0 02/07/2020   RDW 14.2 02/07/2020   PLT 271.0 02/07/2020   Last metabolic panel Lab Results  Component Value Date   GLUCOSE 114 (H) 02/20/2022   NA 137 02/20/2022   K 4.0 02/20/2022   CL 101 02/20/2022    CO2 30 02/20/2022   BUN 17 02/20/2022   CREATININE 1.02 02/20/2022   GFR 71.93 02/20/2022   CALCIUM  10.1 02/20/2022   PROT 7.7 02/20/2022   ALBUMIN 4.4 02/20/2022   LABGLOB 2.6 03/12/2016   AGRATIO 1.7 03/12/2016   BILITOT 0.4 02/20/2022   ALKPHOS 37 (L) 02/20/2022   AST 16 02/20/2022   ALT 19 02/20/2022   Last lipids Lab Results  Component Value Date   CHOL 143 02/20/2022   HDL 39.40 02/20/2022   LDLCALC 79 02/20/2022   LDLDIRECT 84.0 06/11/2018   TRIG 126.0 02/20/2022   CHOLHDL 4 02/20/2022   Last hemoglobin A1c Lab Results  Component Value Date   HGBA1C 6.8 (A) 10/22/2022      The 10-year ASCVD risk score (Arnett DK, et al., 2019) is: 29.1%    Assessment & Plan:   #1 type 2 diabetes.  History of recent good control on Mounjaro  5 mg subcutaneous once weekly.  Recheck A1c today.  Continue with yearly diabetic eye exam.  Urine microalbumin screen 6 months ago was unremarkable.  #2 history of dyslipidemia treated with rosuvastatin  and fenofibrate .  Check lipid and CMP today.  Continue low saturated fat diet.  #3 elevated blood pressure.  Has generally been well-controlled.  Did come down significantly to 142/80 after some rest.  Continue monitoring at home.  Be in touch for any consistent readings over 140 systolic.  Continue low-sodium diet.  Set up 49-month follow-up   Return in about 3 months (around 04/22/2023).    Glean Lamy, MD

## 2023-01-23 DIAGNOSIS — N390 Urinary tract infection, site not specified: Secondary | ICD-10-CM | POA: Diagnosis not present

## 2023-01-24 LAB — URINALYSIS W MICROSCOPIC + REFLEX CULTURE
Bilirubin Urine: NEGATIVE
Glucose, UA: NEGATIVE
Hgb urine dipstick: NEGATIVE
Hyaline Cast: NONE SEEN /[LPF]
Ketones, ur: NEGATIVE
Leukocyte Esterase: NEGATIVE
Nitrites, Initial: NEGATIVE
RBC / HPF: NONE SEEN /[HPF] (ref 0–2)
Specific Gravity, Urine: 1.02 (ref 1.001–1.035)
Squamous Epithelial / HPF: NONE SEEN /[HPF] (ref <=5)
WBC, UA: NONE SEEN /[HPF] (ref 0–5)
pH: 8 (ref 5.0–8.0)

## 2023-01-24 LAB — NO CULTURE INDICATED

## 2023-01-29 DIAGNOSIS — M5459 Other low back pain: Secondary | ICD-10-CM | POA: Diagnosis not present

## 2023-01-29 DIAGNOSIS — R2681 Unsteadiness on feet: Secondary | ICD-10-CM | POA: Diagnosis not present

## 2023-02-12 DIAGNOSIS — M5459 Other low back pain: Secondary | ICD-10-CM | POA: Diagnosis not present

## 2023-02-12 DIAGNOSIS — R2681 Unsteadiness on feet: Secondary | ICD-10-CM | POA: Diagnosis not present

## 2023-02-18 ENCOUNTER — Telehealth: Payer: Self-pay

## 2023-02-18 ENCOUNTER — Other Ambulatory Visit: Payer: Self-pay | Admitting: Family Medicine

## 2023-02-18 ENCOUNTER — Other Ambulatory Visit: Payer: Self-pay | Admitting: Urology

## 2023-02-18 ENCOUNTER — Encounter: Payer: Self-pay | Admitting: Family Medicine

## 2023-02-18 NOTE — Progress Notes (Signed)
   02/18/2023  Patient ID: Todd [name], male   DOB: [birth year], 78 y.o.   MRN: [id number]  Contacted patient via telephone to follow up on his request for [employer] assistance.  Informed patient that unfortunately, [facility] does not currently offer any PAP for Medicare patients. Did discuss Ozempic as a possible alternative as they offer PAP.  Patient states he has tolerated Mounjaro better than ozempic and prefers to keep things as is for now.  Sherrill Raring, PharmD Clinical Pharmacist [phone]

## 2023-02-21 DIAGNOSIS — M5459 Other low back pain: Secondary | ICD-10-CM | POA: Diagnosis not present

## 2023-02-21 DIAGNOSIS — R2681 Unsteadiness on feet: Secondary | ICD-10-CM | POA: Diagnosis not present

## 2023-02-25 NOTE — Progress Notes (Unsigned)
 Virtual Visit Via Video    Consent was obtained for video visit:  {yes no:314532} Answered questions that patient had about telehealth interaction:  {yes no:314532} I discussed the limitations, risks, security and privacy concerns of performing an evaluation and management service by telemedicine. I also discussed with the patient that there may be a patient responsible charge related to this service. The patient expressed understanding and agreed to proceed.  Pt location: Home Physician Location: office Name of referring provider:  Kristian Covey, MD I connected with [facility] at patients initiation/request on 02/27/2023 at  8:15 AM EST by video enabled telemedicine application and verified that I am speaking with the correct person using two identifiers. Pt MRN:  [id number] Pt DOB:  02/19/1945 Video Participants:  [facility];  ***  Assessment/Plan:   ***  Subjective   ***Patient seen today in follow-up for tremor.  I last saw the patient in November, 2023 for tremor, but really did not see a lot on examination at that point in time.  Patient was told to follow-up as needed.  Patient states today that ***.  Patient notes that tremor has gotten worse.  Patient is left-hand dominant.  Patient has no rest tremor.  Patient's tremor is worse as the day progresses.  Tremor is most noticeable when trying to write.  There are some balance difficulties, but does attribute some of this to back troubles that are chronic.  There have been no recent falls.  He has been following with physical therapy.  There is no shuffling.  He saw Dr. Caryl Never January 15.  His diabetes has been well-controlled.  There have been some elevated blood pressure readings, but it has generally been well-controlled as well.  Patient is on no anticoagulants.  Current movement d/o meds:  ***   Current Outpatient Medications on File Prior to Visit  Medication Sig Dispense Refill   Cholecalciferol (VITAMIN D-3  PO) Take 1 tablet by mouth daily in the afternoon.     Coenzyme Q10 (CO Q 10 PO) Take 200 mg by mouth daily.     fenofibrate 160 MG tablet TAKE 1 TABLET BY MOUTH DAILY 100 tablet 2   finasteride (PROSCAR) 5 MG tablet Take 5 mg by mouth daily.     metFORMIN (GLUCOPHAGE) 1000 MG tablet TAKE 1 TABLET BY MOUTH TWICE  DAILY WITH MEALS 200 tablet 2   MOUNJARO 5 MG/0.5ML Pen INJECT THE CONTENTS OF ONE PEN  SUBCUTANEOUSLY WEEKLY AS  DIRECTED 6 mL 3   omeprazole (PRILOSEC) 40 MG capsule 1 cap(s) orally 20 minutes before breakfast for 30 day(s     polycarbophil (FIBERCON) 625 MG tablet Take 625 mg by mouth daily. Taking 2 Tablet     rosuvastatin (CRESTOR) 10 MG tablet TAKE 1 TABLET BY MOUTH DAILY 100 tablet 2   solifenacin (VESICARE) 10 MG tablet Take 10 mg by mouth daily.     vitamin B-12 (CYANOCOBALAMIN) 1000 MCG tablet Take 1,000 mcg by mouth daily.     VITAMIN D, ERGOCALCIFEROL, PO Take 2,000 Units by mouth daily.      No current facility-administered medications on file prior to visit.     Objective   There were no vitals filed for this visit. GEN:  The patient appears stated age and is in NAD.  Neurological examination:  Orientation: The patient is alert and oriented x3. Cranial nerves: There is good facial symmetry. There is ***facial hypomimia.  The speech is fluent and clear. Soft palate rises  symmetrically and there is no tongue deviation. Hearing is intact to conversational tone. Motor: Strength is at least antigravity x 4.   Shoulder shrug is equal and symmetric.  There is no pronator drift.  Movement examination: Tone: unable Abnormal movements: ***No rest tremor is seen.  No postural or intention tremor. Coordination:  There is *** decremation with RAM's, ***with any form of RAMS, including alternating supination and pronation of the forearm, hand opening and closing, finger taps, heel taps and toe taps.  Gait and Station: The patient has *** difficulty arising out of a  deep-seated chair without the use of the hands. The patient's stride length is ***.      Follow up Instructions      -I discussed the assessment and treatment plan with the patient. The patient was provided an opportunity to ask questions and all were answered. The patient agreed with the plan and demonstrated an understanding of the instructions.   The patient was advised to call back or seek an in-person evaluation if the symptoms worsen or if the condition fails to improve as anticipated.    Total time spent on today's visit was ***minutes, including both face-to-face time and nonface-to-face time.  Time included that spent on review of records (prior notes available to me/labs/imaging if pertinent), discussing treatment and goals, answering patient's questions and coordinating care.   Kerin Salen, DO

## 2023-02-27 ENCOUNTER — Other Ambulatory Visit: Payer: Self-pay | Admitting: Urology

## 2023-02-27 ENCOUNTER — Telehealth: Payer: Medicare Other | Admitting: Neurology

## 2023-02-27 DIAGNOSIS — R2681 Unsteadiness on feet: Secondary | ICD-10-CM | POA: Diagnosis not present

## 2023-02-27 DIAGNOSIS — G20C Parkinsonism, unspecified: Secondary | ICD-10-CM

## 2023-02-27 DIAGNOSIS — R251 Tremor, unspecified: Secondary | ICD-10-CM

## 2023-02-27 NOTE — Addendum Note (Signed)
 Addended by: Ila Mcgill C on: 02/27/2023 11:15 AM   Modules accepted: Orders

## 2023-02-28 DIAGNOSIS — R2681 Unsteadiness on feet: Secondary | ICD-10-CM | POA: Diagnosis not present

## 2023-02-28 DIAGNOSIS — M5459 Other low back pain: Secondary | ICD-10-CM | POA: Diagnosis not present

## 2023-03-03 ENCOUNTER — Encounter: Payer: Self-pay | Admitting: Cardiovascular Disease

## 2023-03-03 NOTE — Progress Notes (Unsigned)
 Cardiology Office Note   Date:  03/04/2023   ID:  Todd [name], Todd [name] 1945-03-15, MRN [id number]  PCP:  Kristian Covey, MD  Cardiologist:   Kristeen Miss, MD   Chief Complaint  Patient presents with   Hypertension        Hyperlipidemia   1. Hypercholesterolemia 2. Palpitations 3. History of chest pain- normal stress Myoview study in October, 2005 4. Diabetes mellitus 5. Hypertension 6. Atypical chest pain:    Todd [name] is a 78 year old gentleman with a history as noted above.   He is remaining quite active. He is fairly healthy. He's been exercising on a fairly regular basis.  He denies episodes of chest pain or shortness of breath.  Feb. 6, 2014: Todd [name] is doing well.  He's not had any episodes of chest pain or shortness breath. He still exercising some but not as much as he would like to.  He gained a little bit of weight on a cruise this past fall and still working on getting that weight off.  April, 18, 2014:  He feels ok but has had an unusual sensation across his chest.   It is a similar sensation that he gets in his arms with the pravachol.  He has decreased his dose and feels better. The sensation is not related to exercise, taking a deep breath, eating or drinking. It is also not related to twisting or turning of his torso..  the sensation is an off and on sensation. to last for a couple of hours.  Nov. 3, 2014:  Todd [name] is doing well.  Exercising sporadically .    Sept. 3, 2015:  Todd [name] is doing ok.  Not exercising as much. 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    Has gained some weight - lack of exercise,  Has not changed his diet.        May 04, 2014:   Todd [name] is a 78 y.o. male who presents for follow up of his HTN and hyperlipidemia.   He's been having some episodes of CP recently.  Worse with deep breath. More fatigued for the past couple of weeks  .  Sleeping well.  Wants to get back into an exercise  regimin.   Nov. 8, 2016:  Doing well.  Fairly active.  Walks on occasion.    Dec. 5, 2017:  Todd [name] is doing well. Had a CT of the abdomen that mentioned possible coronary artery calcifications.  Had a low risk myoview in July, 2017. Lipids in Sept. 2016.  are ok.   Trigs = 217, LDL was 94 Has a physical coming up in a week or so   No CP.   Still very active .   Limited by his back pain .   Aug. 20, 2018:  Todd [name] is seen back today  For further evaluation of his hyperlipidemia and coronary calcifications. He had a Myoview study in July, 2017 which was low risk.  Has gotten married since I last saw him .  No CP or dyspnea. Not as much exercise as he would like Has some back issues.   November 27, 2016: Todd [name] is seen today with his new wife, Todd [name].  Has had some heart burn recently .   Occurs spontaneously.   Not associated with exertion  No Cp or dyspnea with any exertion   Resolved with Prilosec.    Not getting enough exercise,   Still traveling  July 03, 2017 :  Todd [name] is seen today  No CP , no dyspnea.    Still working .  Will be moving to [place]  in several weeks.  January 14, 2018: Todd [name] seen today for follow-up visit.  He has a history of hypertension and hyperlipidemia. Has moved to [facility] since I last saw him. No CP or dyspnea.  Has not been exercising as much .   Jan. 31, 2022 Todd [name] is seen today for follow up visit for his HTN and HLD. Has moved to [place],   Has DM.  Is not exercising much .  No CP , dyspnea.   Had lipids drawn this am at Dr. Lucie Leather office   Jan. 27, 2023; Todd [name] is seen today for follow up visit  for his HTN and HLD.  Has moved to [place], [place]  No CP , no dyspnea Not exercising as much ,    Has back issues that slow him down .   He recently had an episode of diverticulitis and wound up in the ER in [place], [place] [facility].  Comprehensive metabolic profile there showed normal liver enzymes.  Sodium was 140, potassium  is 4.0.  Creatinine 0.99.  Glucose was 158 (nonfasting)   Feb. 23, 2024 Todd [name] is seen today for follow up of his HTN, HLD  Has moved to [place], [place]   Exercising intermittently   No CP , no dyspnea  Labs from Dr. Lucie Leather office from February 20, 2022 look good.  His LDL is 79, triglyceride level is 126, cholesterol is 143, HDL is 39.4.  Feb. 25, 2025 Todd [name] is seen today for follow up of his HTN, HLD  Has been losing some weight  Exercising fairly regularly   LDL was 57 Trigs  = 165       Past Medical History:  Diagnosis Date   DIABETES MELLITUS, TYPE II 08/08/2009   Hiatal hernia    History of nuclear stress test    Myoview 02/2019: EF 54, apical inf and apical artifact, normal perfusion, low risk   HYPERLIPIDEMIA 08/08/2009   Palpitations    URINARY INCONTINENCE 08/08/2009    Past Surgical History:  Procedure Laterality Date   ACHILLES TENDON REPAIR  01/08/2000   rupture   APPENDECTOMY  01/07/1974   CARDIOVASCULAR STRESS TEST  11/02/2003   EF 57%   MENISCUS REPAIR Left    TONSILLECTOMY  01/08/1959   US ECHOCARDIOGRAPHY  02/10/2001   EF 60-65%     Current Outpatient Medications  Medication Sig Dispense Refill   Cholecalciferol (VITAMIN D-3 PO) Take 1 tablet by mouth daily in the afternoon.     Coenzyme Q10 (CO Q 10 PO) Take 200 mg by mouth daily.     fenofibrate 160 MG tablet TAKE 1 TABLET BY MOUTH DAILY 100 tablet 2   finasteride (PROSCAR) 5 MG tablet Take 5 mg by mouth daily.     fluticasone (FLONASE) 50 MCG/ACT nasal spray Place 2 sprays into the nose daily.     metFORMIN (GLUCOPHAGE) 1000 MG tablet TAKE 1 TABLET BY MOUTH TWICE  DAILY WITH MEALS 200 tablet 2   MOUNJARO 5 MG/0.5ML Pen INJECT THE CONTENTS OF ONE PEN  SUBCUTANEOUSLY WEEKLY AS  DIRECTED 6 mL 3   MYRBETRIQ 50 MG TB24 tablet Take 50 mg by mouth daily.     omeprazole (PRILOSEC) 40 MG capsule 1 cap(s) orally 20 minutes before breakfast for 30 day(s     polycarbophil (FIBERCON) 625 MG tablet Take 625  mg by  mouth daily. Taking 2 Tablet     rosuvastatin (CRESTOR) 10 MG tablet TAKE 1 TABLET BY MOUTH DAILY 100 tablet 2   solifenacin (VESICARE) 10 MG tablet Take 10 mg by mouth daily.     vitamin B-12 (CYANOCOBALAMIN) 1000 MCG tablet Take 1,000 mcg by mouth daily.     VITAMIN D, ERGOCALCIFEROL, PO Take 2,000 Units by mouth daily.      MIEBO 1.338 GM/ML SOLN  (Patient not taking: Reported on 03/04/2023)     No current facility-administered medications for this visit.    Allergies:   Jardiance [empagliflozin], Diphenhydramine-phenylephrine, Lisinopril, Pseudoephedrine hcl, Sudafed pe sinus cong day-nght  [diphenhydramine-phenylephrine], Triprolidine-pse, Actifed cold-sinus, Pseudoephedrine, and Triprolidine hcl    Social History:  The patient  reports that he quit smoking about 45 years ago. His smoking use included cigarettes. He started smoking about 55 years ago. He has a 20 pack-year smoking history. He has never used smokeless tobacco. He reports current alcohol use. He reports that he does not use drugs.   Family History:  The patient's family history includes Diabetes in his sister; Mental illness in his mother.    ROS: Noted in current history, otherwise review of systems is negative.    Physical Exam: Blood pressure 126/62, pulse 82, height 6\' 2"  (1.88 m), weight 190 lb 6.4 oz (86.4 kg), SpO2 99%.       GEN:  Well nourished, well developed in no acute distress HEENT: Normal NECK: No JVD; No carotid bruits LYMPHATICS: No lymphadenopathy CARDIAC: RRR , no murmurs, rubs, gallops RESPIRATORY:  Clear to auscultation without rales, wheezing or rhonchi  ABDOMEN: Soft, non-tender, non-distended MUSCULOSKELETAL:  No edema; No deformity  SKIN: Warm and dry NEUROLOGIC:  Alert and oriented x 3    EKG:      EKG Interpretation Date/Time:  Tuesday March 04 2023 10:07:36 EST Ventricular Rate:  82 PR Interval:  164 QRS Duration:  104 QT Interval:  372 QTC Calculation: 434 R  Axis:   -21  Text Interpretation: Normal sinus rhythm Nonspecific T wave abnormality When compared with ECG of 11-May-2012 09:42, Vent. rate has decreased BY  69 BPM QRS duration has increased ST no longer depressed in Inferior leads Nonspecific T wave abnormality now evident in Lateral leads Confirmed by Kristeen Miss ([id number]) on 03/04/2023 10:25:29 AM       Recent Labs: 01/22/2023: ALT 18; BUN 14; Creatinine, Ser 0.89; Potassium 4.0; Sodium 142    Lipid Panel    Component Value Date/Time   CHOL 134 01/22/2023 1355   CHOL 125 02/02/2021 1121   TRIG 165.0 (H) 01/22/2023 1355   HDL 43.70 01/22/2023 1355   HDL 33 (L) 02/02/2021 1121   CHOLHDL 3 01/22/2023 1355   VLDL 33.0 01/22/2023 1355   LDLCALC 57 01/22/2023 1355   LDLCALC 69 02/02/2021 1121   LDLDIRECT 84.0 06/11/2018 1052      Wt Readings from Last 3 Encounters:  03/04/23 190 lb 6.4 oz (86.4 kg)  01/22/23 191 lb 14.4 oz (87 kg)  12/24/22 198 lb (89.8 kg)      Other studies Reviewed: Additional studies/ records that were reviewed today include: . Review of the above records demonstrates:    ASSESSMENT AND PLAN:  1. Hypercholesterolemia-    triglycerides remain mildly elevated.  Otherwise his lipids look good.  2. PVCs    3. History of chest pain-    he is not having any recurrent episodes of chest pain.  4. Diabetes mellitus - managed by primary  MD    5. Hypertension he has had hypertension in the past.  It is now fairly well-controlled just with diet, weight loss and exercise.  Continue current plan.  He will see [place] again in 1 year.         Current medicines are reviewed at length with the patient today.  The patient does not have concerns regarding medicines.  The following changes have been made:  no change  Labs/ tests ordered today include:   Orders Placed This Encounter  Procedures   EKG 12-Lead     Disposition:   1 year     Signed, Kristeen Miss, MD  03/04/2023 10:25 AM    [facility] [address] [place], [place], [place]  [address] Phone: [phone]; Fax: [phone]

## 2023-03-04 ENCOUNTER — Encounter: Payer: Self-pay | Admitting: Cardiovascular Disease

## 2023-03-04 ENCOUNTER — Ambulatory Visit: Payer: Medicare Other | Attending: Cardiovascular Disease | Admitting: Cardiovascular Disease

## 2023-03-04 VITALS — BP 126/62 | HR 82 | Ht 74.0 in | Wt 190.4 lb

## 2023-03-04 DIAGNOSIS — I10 Essential (primary) hypertension: Secondary | ICD-10-CM | POA: Diagnosis not present

## 2023-03-04 DIAGNOSIS — E782 Mixed hyperlipidemia: Secondary | ICD-10-CM | POA: Diagnosis not present

## 2023-03-04 NOTE — Patient Instructions (Signed)

## 2023-03-05 DIAGNOSIS — R2681 Unsteadiness on feet: Secondary | ICD-10-CM | POA: Diagnosis not present

## 2023-03-05 DIAGNOSIS — M5459 Other low back pain: Secondary | ICD-10-CM | POA: Diagnosis not present

## 2023-03-05 NOTE — Progress Notes (Unsigned)
 Assessment/Plan:   1.  Parkinsons Disease  -***Currently has MRI pending in [place].  -***  -Discussed his care.  He has lived in [place] for a number of years, but has sought medical care here in [place].  That being said, this is a chronic neurodegenerative process and I really do think that he likely needs to seek care for this diagnosis in [place].  Claris Gower has very good physicians for this diagnosis.  We talked about Dr. Adria Dill, Dr. Ernestina Penna, Dr. Juliet Rude.   Subjective:   Todd [name] was seen today in follow up for abnormal gait/gait instability/tremor.  MRI is pending in [place].  Patient was worried about NPH.  Patient has tremor at rest at the left upper extremity.  No falls.  He continues to work as a Psychologist, occupational.  He is shuffling his feet, but continues to stay active.  He has no swallowing troubles.  No speech troubles.  No cognitive difficulties.  No diplopia.  On no antipsychotics.  Not on Reglan.  Current prescribed movement disorder medications: ***   PREVIOUS MEDICATIONS: {Parkinson's RX:18200}  ALLERGIES:   Allergies  Allergen Reactions   Jardiance [Empagliflozin]     Yeast infections   Diphenhydramine-Phenylephrine Other (See Comments)   Lisinopril Swelling   Pseudoephedrine Hcl Other (See Comments)   Sudafed Pe Sinus Cong Day-Nght  [Diphenhydramine-Phenylephrine] Other (See Comments)   Triprolidine-Pse Rash   Actifed Cold-Sinus     rash   Pseudoephedrine     REACTION: hives   Triprolidine Hcl     CURRENT MEDICATIONS:  No outpatient medications have been marked as taking for the 03/07/23 encounter (Appointment) with Kynzlie Hilleary, Octaviano Batty, DO.     Objective:   PHYSICAL EXAMINATION:    VITALS:  There were no vitals filed for this visit.  GEN:  The patient appears stated age and is in NAD. HEENT:  Normocephalic, atraumatic.  The mucous membranes are moist. The superficial temporal arteries are without ropiness or tenderness. CV:  RRR Lungs:   CTAB Neck/HEME:  There are no carotid bruits bilaterally.  Neurological examination:  Orientation: The patient is alert and oriented x3. Cranial nerves: There is good facial symmetry with*** facial hypomimia. The speech is fluent and clear. Soft palate rises symmetrically and there is no tongue deviation. Hearing is intact to conversational tone. Sensation: Sensation is intact to light touch throughout Motor: Strength is at least antigravity x4.  Movement examination: Tone: There is ***tone in the *** Abnormal movements: *** Coordination:  There is *** decremation with RAM's, *** Gait and Station: The patient has *** difficulty arising out of a deep-seated chair without the use of the hands. The patient's stride length is ***.  The patient has a *** pull test.     I have reviewed and interpreted the following labs independently    Chemistry      Component Value Date/Time   NA 142 01/22/2023 1355   NA 136 01/14/2018 1146   K 4.0 01/22/2023 1355   CL 103 01/22/2023 1355   CO2 31 01/22/2023 1355   BUN 14 01/22/2023 1355   BUN 12 01/14/2018 1146   CREATININE 0.89 01/22/2023 1355   CREATININE 1.06 12/12/2015 1015      Component Value Date/Time   CALCIUM 10.0 01/22/2023 1355   ALKPHOS 39 01/22/2023 1355   AST 13 01/22/2023 1355   ALT 18 01/22/2023 1355   BILITOT 0.3 01/22/2023 1355   BILITOT <0.2 01/14/2018 1146       Lab Results  Component Value Date   WBC 6.6 02/07/2020   HGB 14.5 02/07/2020   HCT 44.0 02/07/2020   MCV 84.0 02/07/2020   PLT 271.0 02/07/2020    Lab Results  Component Value Date   TSH 1.49 11/13/2021     Total time spent on today's visit was ***30 minutes, including both face-to-face time and nonface-to-face time.  Time included that spent on review of records (prior notes available to me/labs/imaging if pertinent), discussing treatment and goals, answering patient's questions and coordinating care.  Cc:  Kristian Covey, MD

## 2023-03-07 ENCOUNTER — Encounter: Payer: Self-pay | Admitting: Neurology

## 2023-03-07 ENCOUNTER — Ambulatory Visit: Payer: Medicare Other | Admitting: Neurology

## 2023-03-07 VITALS — BP 128/78 | HR 77 | Ht 74.0 in | Wt 194.4 lb

## 2023-03-07 DIAGNOSIS — G20C Parkinsonism, unspecified: Secondary | ICD-10-CM

## 2023-03-07 DIAGNOSIS — R413 Other amnesia: Secondary | ICD-10-CM

## 2023-03-07 DIAGNOSIS — R251 Tremor, unspecified: Secondary | ICD-10-CM

## 2023-03-13 DIAGNOSIS — R2681 Unsteadiness on feet: Secondary | ICD-10-CM | POA: Diagnosis not present

## 2023-03-13 DIAGNOSIS — G9389 Other specified disorders of brain: Secondary | ICD-10-CM | POA: Diagnosis not present

## 2023-03-13 DIAGNOSIS — M5459 Other low back pain: Secondary | ICD-10-CM | POA: Diagnosis not present

## 2023-03-14 ENCOUNTER — Telehealth: Payer: Self-pay | Admitting: Neurology

## 2023-03-14 NOTE — Telephone Encounter (Signed)
 Called patient and informed him that per Dr. Arbutus Leas  MRI brain is reported to be nonacute but if he can get a disc of the films and bring at next visit (or drop off here when he has his DaTscan) that would be great   Patient stated he will do that. Patient requested I send this message to him on MyChart as he is in a meeting right now. Message sent. Patient had no further questions or concerns.

## 2023-03-14 NOTE — Telephone Encounter (Signed)
 Let pt know that MRI brain is reported to be nonacute but if he can get a disc of the films and bring at next visit (or drop off here when he has his DaTscan) that would be great

## 2023-03-21 DIAGNOSIS — M5459 Other low back pain: Secondary | ICD-10-CM | POA: Diagnosis not present

## 2023-03-21 DIAGNOSIS — R2681 Unsteadiness on feet: Secondary | ICD-10-CM | POA: Diagnosis not present

## 2023-03-22 DIAGNOSIS — R0981 Nasal congestion: Secondary | ICD-10-CM | POA: Diagnosis not present

## 2023-03-25 DIAGNOSIS — M5416 Radiculopathy, lumbar region: Secondary | ICD-10-CM | POA: Diagnosis not present

## 2023-03-25 NOTE — Telephone Encounter (Signed)
 Pt came in and brought in disc of his MRI. Disc is in Dr. Don Perking box

## 2023-03-26 DIAGNOSIS — R251 Tremor, unspecified: Secondary | ICD-10-CM | POA: Diagnosis not present

## 2023-03-27 DIAGNOSIS — R2681 Unsteadiness on feet: Secondary | ICD-10-CM | POA: Diagnosis not present

## 2023-03-27 DIAGNOSIS — M5459 Other low back pain: Secondary | ICD-10-CM | POA: Diagnosis not present

## 2023-03-31 NOTE — Progress Notes (Unsigned)
 Virtual Visit Via Video       Consent was obtained for video visit:  Yes.   Answered questions that patient had about telehealth interaction:  Yes.   I discussed the limitations, risks, security and privacy concerns of performing an evaluation and management service by telemedicine. I also discussed with the patient that there may be a patient responsible charge related to this service. The patient expressed understanding and agreed to proceed.  Pt location: Home in [place] Physician Location: office Name of referring provider:  Kristian Covey, MD I connected with Todd [name] at patients initiation/request on 04/01/2023 at  3:00 PM EDT by video enabled telemedicine application and verified that I am speaking with the correct person using two identifiers. Pt MRN:  [id number] Pt DOB:  1945-05-19 Video Participants:  Todd [name];   wife supplements hx Assessment/Plan:   1.  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  -We discussed that while he does have some very mild parkinsonian features, he does not meet the criteria for Parkinsons disease.  His DaTscan is positive, which would suggest that he likely will meet full criteria in the future.  That being said, at this point I would not recommend taking medication currently.  I would recommend safe, cardiovascular exercise of 150 minutes/week.  Offered physical therapy, and he states he is currently working with a physical therapist and he would like to continue working with his therapist.  He is going to increase his cardiovascular component, however.  -They asked about his gait.  I did tell them that I thought that his slight dragging of the right leg was likely related to parkinsonism, but again stressed that he does not yet meet criteria for Parkinsons disease.  We did talk about the fact that he likely need to this criteria in the  future.  2.  Memory change  -His wife notices this.  I am not convinced at all of a neurodegenerative process.  Discussed neurocognitive testing and he would like to proceed.  Discussed details.  3.  Hypertension/hyperlipidemia  -Following with cardiology and just saw Dr. Elease Hashimoto on February 25.  Subjective:   Todd [name] was seen today in follow up for abnormal gait/gait instability/tremor.  MRI brain was completed at [facility] in [place] and patient brought me those films and I personally reviewed them.  There was mild to moderate small vessel disease.  Ventricular size was reported to be "borderline."  He subsequently had DaTscan on March 19, which demonstrated symmetrically reduced dopamine uptake in the bilateral posterior striata.    ALLERGIES:   Allergies  Allergen Reactions   Jardiance [Empagliflozin]     Yeast infections   Diphenhydramine-Phenylephrine Other (See Comments)   Lisinopril Swelling   Pseudoephedrine Hcl Other (See Comments)   Sudafed Pe Sinus Cong Day-Nght  [Diphenhydramine-Phenylephrine] Other (See Comments)   Triprolidine-Pse Rash   Actifed Cold-Sinus     rash   Pseudoephedrine     REACTION: hives   Triprolidine Hcl     CURRENT MEDICATIONS:  No outpatient medications have been marked as taking for the 04/01/23 encounter (Appointment) with Dodge Ator, Octaviano Batty, DO.     Objective:   PHYSICAL EXAMINATION:    VITALS:   There were no vitals filed for this visit.  Virtually 100% of visit in counseling.  His speech was fluent and clear.  He did get up out of his chair easily as  he had gotten up to retrieve his wife for the visit and walked in and out of the camera.  His walk was good.  He was alert and oriented x 3.  I have reviewed and interpreted the following labs independently    Chemistry      Component Value Date/Time   NA 142 01/22/2023 1355   NA 136 01/14/2018 1146   K 4.0 01/22/2023 1355   CL 103 01/22/2023 1355   CO2 31 01/22/2023 1355    BUN 14 01/22/2023 1355   BUN 12 01/14/2018 1146   CREATININE 0.89 01/22/2023 1355   CREATININE 1.06 12/12/2015 1015      Component Value Date/Time   CALCIUM 10.0 01/22/2023 1355   ALKPHOS 39 01/22/2023 1355   AST 13 01/22/2023 1355   ALT 18 01/22/2023 1355   BILITOT 0.3 01/22/2023 1355   BILITOT <0.2 01/14/2018 1146       Lab Results  Component Value Date   WBC 6.6 02/07/2020   HGB 14.5 02/07/2020   HCT 44.0 02/07/2020   MCV 84.0 02/07/2020   PLT 271.0 02/07/2020    Lab Results  Component Value Date   TSH 1.49 11/13/2021    Follow up Instructions      -I discussed the assessment and treatment plan with the patient. The patient was provided an opportunity to ask questions and all were answered. The patient agreed with the plan and demonstrated an understanding of the instructions.   The patient was advised to call back or seek an in-person evaluation if the symptoms worsen or if the condition fails to improve as anticipated.    Total time spent on today's visit was , including both face-to-face time and nonface-to-face time.  Time included that spent on review of records (prior notes available to me/labs/imaging if pertinent), discussing treatment and goals, answering patient's questions and coordinating care.   Kerin Salen, DO   Cc:  Kristian Covey, MD

## 2023-04-01 ENCOUNTER — Telehealth: Admitting: Neurology

## 2023-04-01 DIAGNOSIS — I10 Essential (primary) hypertension: Secondary | ICD-10-CM

## 2023-04-01 DIAGNOSIS — R413 Other amnesia: Secondary | ICD-10-CM | POA: Diagnosis not present

## 2023-04-01 DIAGNOSIS — E785 Hyperlipidemia, unspecified: Secondary | ICD-10-CM | POA: Diagnosis not present

## 2023-04-01 DIAGNOSIS — G20C Parkinsonism, unspecified: Secondary | ICD-10-CM

## 2023-04-01 NOTE — Patient Instructions (Signed)
 You have been referred for a neurocognitive evaluation (i.e., evaluation of memory and thinking abilities). Please bring someone with you to this appointment if possible, as it is helpful for the neuropsychologist to hear from both you and another adult who knows you well. Please bring eyeglasses and hearing aids if you wear them and take any medications as you normally would. Please fully abstain from all alcohol, marijuana, or other substances prior to your appointment.   The evaluation will take approximately 2-3 hours and has two parts:   The first part is a clinical interview with the neuropsychologist, Dr. Milbert Coulter or Dr. Robbie Lis. During the interview, the neuropsychologist will speak with you and the individual you brought to the appointment.    The second part of the evaluation is testing with the doctor's technician, aka psychometrician, Annabelle Harman or [employer]. During the testing, the technician will ask you to remember different types of material, solve problems, and answer some questionnaires. Your family member will not be present for this portion of the evaluation.   Please note: We have to reserve several hours of the neuropsychologist's time and the psychometrician's time for your evaluation appointment. As such, there is a No-Show fee of $100. If you are unable to attend any of your appointments, please contact our office as soon as possible to reschedule.

## 2023-04-04 DIAGNOSIS — M5459 Other low back pain: Secondary | ICD-10-CM | POA: Diagnosis not present

## 2023-04-04 DIAGNOSIS — R2681 Unsteadiness on feet: Secondary | ICD-10-CM | POA: Diagnosis not present

## 2023-04-11 DIAGNOSIS — R2681 Unsteadiness on feet: Secondary | ICD-10-CM | POA: Diagnosis not present

## 2023-04-11 DIAGNOSIS — M5459 Other low back pain: Secondary | ICD-10-CM | POA: Diagnosis not present

## 2023-04-18 DIAGNOSIS — M5459 Other low back pain: Secondary | ICD-10-CM | POA: Diagnosis not present

## 2023-04-18 DIAGNOSIS — R2681 Unsteadiness on feet: Secondary | ICD-10-CM | POA: Diagnosis not present

## 2023-04-25 DIAGNOSIS — M5459 Other low back pain: Secondary | ICD-10-CM | POA: Diagnosis not present

## 2023-04-25 DIAGNOSIS — R2681 Unsteadiness on feet: Secondary | ICD-10-CM | POA: Diagnosis not present

## 2023-04-28 ENCOUNTER — Other Ambulatory Visit: Payer: Self-pay | Admitting: Family Medicine

## 2023-04-28 ENCOUNTER — Encounter: Payer: Self-pay | Admitting: Family Medicine

## 2023-04-30 ENCOUNTER — Encounter: Payer: Self-pay | Admitting: Family Medicine

## 2023-04-30 ENCOUNTER — Ambulatory Visit (INDEPENDENT_AMBULATORY_CARE_PROVIDER_SITE_OTHER): Payer: Medicare Other

## 2023-04-30 VITALS — Ht 74.0 in | Wt 189.0 lb

## 2023-04-30 DIAGNOSIS — Z0000 Encounter for general adult medical examination without abnormal findings: Secondary | ICD-10-CM | POA: Diagnosis not present

## 2023-04-30 NOTE — Progress Notes (Signed)
 Subjective:   Todd [name] is a 78 y.o. who presents for a Medicare Wellness preventive visit.  Visit Complete: Virtual I connected with  Todd [name] on 04/30/23 by a audio enabled telemedicine application and verified that I am speaking with the correct person using two identifiers.  Patient Location: Home  Provider Location: Home Office  I discussed the limitations of evaluation and management by telemedicine. The patient expressed understanding and agreed to proceed.  Vital Signs: Because this visit was a virtual/telehealth visit, some criteria may be missing or patient reported. Any vitals not documented were not able to be obtained and vitals that have been documented are patient reported.    Persons Participating in Visit: Patient.  AWV Questionnaire: Yes: Patient Medicare AWV questionnaire was completed by the patient on 04/28/23; I have confirmed that all information answered by patient is correct and no changes since this date.  Cardiac Risk Factors include: advanced age (>19men, >41 women);male gender;diabetes mellitus;hypertension;dyslipidemia     Objective:    Today's Vitals   04/30/23 1123  Weight: 189 lb (85.7 kg)  Height: 6\' 2"  (1.88 m)   Body mass index is 24.27 kg/m.     04/30/2023   11:30 AM 04/01/2023    2:45 PM 03/07/2023   10:23 AM 04/24/2022   12:41 PM 11/13/2021   12:53 PM 04/17/2021    8:52 AM 04/11/2020    8:54 AM  Advanced Directives  Does Patient Have a Medical Advance Directive? No No No No No No No  Would patient like information on creating a medical advance directive? No - Patient declined   No - Patient declined  No - Patient declined No - Patient declined    Current Medications (verified) Outpatient Encounter Medications as of 04/30/2023  Medication Sig   Cholecalciferol (VITAMIN D-3 PO) Take 1 tablet by mouth daily in the afternoon.   Coenzyme Q10 (CO Q 10 PO) Take 200 mg by mouth daily.   finasteride (PROSCAR) 5 MG tablet Take 5 mg  by mouth daily.   fluticasone (FLONASE) 50 MCG/ACT nasal spray Place 2 sprays into the nose daily.   metFORMIN  (GLUCOPHAGE ) 1000 MG tablet TAKE 1 TABLET BY MOUTH TWICE  DAILY WITH MEALS   MOUNJARO  5 MG/0.5ML Pen INJECT THE CONTENTS OF ONE PEN  SUBCUTANEOUSLY WEEKLY AS  DIRECTED   polycarbophil (FIBERCON) 625 MG tablet Take 625 mg by mouth daily. Taking 2 Tablet   rosuvastatin  (CRESTOR ) 10 MG tablet TAKE 1 TABLET BY MOUTH DAILY   vitamin B-12 (CYANOCOBALAMIN) 1000 MCG tablet Take 1,000 mcg by mouth daily.   VITAMIN D, ERGOCALCIFEROL, PO Take 2,000 Units by mouth daily.    No facility-administered encounter medications on file as of 04/30/2023.    Allergies (verified) Jardiance  [empagliflozin ], Diphenhydramine-phenylephrine, Lisinopril, Pseudoephedrine hcl, Sudafed pe sinus cong day-nght  [diphenhydramine-phenylephrine], Triprolidine-pse, Actifed cold-sinus, Pseudoephedrine, and Triprolidine hcl   History: Past Medical History:  Diagnosis Date   DIABETES MELLITUS, TYPE II 08/08/2009   Hiatal hernia    History of nuclear stress test    Myoview  02/2019: EF 54, apical inf and apical artifact, normal perfusion, low risk   HYPERLIPIDEMIA 08/08/2009   Palpitations    URINARY INCONTINENCE 08/08/2009   Past Surgical History:  Procedure Laterality Date   ACHILLES TENDON REPAIR  01/08/2000   rupture   APPENDECTOMY  01/07/1974   CARDIOVASCULAR STRESS TEST  11/02/2003   EF 57%   MENISCUS REPAIR Left    TONSILLECTOMY  01/08/1959   US  ECHOCARDIOGRAPHY  02/10/2001   EF 60-65%   Family History  Problem Relation Age of Onset   Mental illness Mother    Diabetes Sister        type ll   Social History   Socioeconomic History   Marital status: Married    Spouse name: Not on file   Number of children: Not on file   Years of education: Not on file   Highest education level: Bachelor's degree (e.g., BA, AB, BS)  Occupational History   Occupation: self employed    Comment: consulting  Tobacco Use    Smoking status: Former    Current packs/day: 0.00    Average packs/day: 2.0 packs/day for 10.0 years (20.0 ttl pk-yrs)    Types: Cigarettes    Start date: 04/20/1967    Quit date: 04/19/1977    Years since quitting: 46.0   Smokeless tobacco: Never   Tobacco comments:    discussed AAA but think he has had this  Vaping Use   Vaping status: Never Used  Substance and Sexual Activity   Alcohol use: Yes    Comment: 1 glass wine/month   Drug use: No   Sexual activity: Not on file  Other Topics Concern   Not on file  Social History Narrative   Left handed   Self em[ployed   Social Drivers of Health   Financial Resource Strain: Low Risk  (04/30/2023)   Overall Financial Resource Strain (CARDIA)    Difficulty of Paying Living Expenses: Not hard at all  Food Insecurity: No Food Insecurity (04/30/2023)   Hunger Vital Sign    Worried About Running Out of Food in the Last Year: Never true    Ran Out of Food in the Last Year: Never true  Transportation Needs: No Transportation Needs (04/30/2023)   PRAPARE - Administrator, Civil Service (Medical): No    Lack of Transportation (Non-Medical): No  Physical Activity: Insufficiently Active (04/30/2023)   Exercise Vital Sign    Days of Exercise per Week: 4 days    Minutes of Exercise per Session: 30 min  Stress: No Stress Concern Present (04/30/2023)   [employer] of Occupational Health - Occupational Stress Questionnaire    Feeling of Stress : Not at all  Social Connections: Socially Integrated (04/30/2023)   Social Connection and Isolation Panel [NHANES]    Frequency of Communication with Friends and Family: More than three times a week    Frequency of Social Gatherings with Friends and Family: More than three times a week    Attends Religious Services: More than 4 times per year    Active Member of [employer] or Organizations: Yes    Attends Engineer, structural: More than 4 times per year    Marital Status: Married     Tobacco Counseling Counseling given: Not Answered Tobacco comments: discussed AAA but think he has had this    Clinical Intake:  Pre-visit preparation completed: Yes  Pain : No/denies pain     BMI - recorded: 24.27 Nutritional Status: BMI of 19-24  Normal Nutritional Risks: None Diabetes: Yes CBG done?: No Did pt. bring in CBG monitor from home?: No  Lab Results  Component Value Date   HGBA1C 6.9 (H) 01/22/2023   HGBA1C 6.8 (A) 10/22/2022   HGBA1C 7.2 (H) 07/17/2022     How often do you need to have someone help you when you read instructions, pamphlets, or other written materials from your doctor or pharmacy?: 1 - Never  Interpreter Needed?: No  Information entered by :: Farris Hong LPN   Activities of Daily Living     04/30/2023   11:28 AM 04/28/2023   11:31 AM  In your present state of health, do you have any difficulty performing the following activities:  Hearing? 0 0  Vision? 0 0  Difficulty concentrating or making decisions? 0 0  Walking or climbing stairs? 0 0  Dressing or bathing? 0 0  Doing errands, shopping? 0 0  Preparing Food and eating ? N N  Using the Toilet? N N  In the past six months, have you accidently leaked urine? N N  Do you have problems with loss of bowel control? N N  Managing your Medications? N N  Managing your Finances? N N  Housekeeping or managing your Housekeeping? N N    Patient Care Team: Marquetta Sit, MD as PCP - General [name], Lela Purple, MD as PCP - Cardiology (Cardiology) Alver Austin, [facility] (Inactive) as Pharmacist (Pharmacist)  Indicate any recent Medical Services you may have received from other than [facility] providers in the past year (date may be approximate).     Assessment:   This is a routine wellness examination for Todd [name].  Hearing/Vision screen Hearing Screening - Comments:: Denies hearing difficulties   Vision Screening - Comments:: Wears rx glasses - up to date with routine eye exams with   [facility]   Goals Addressed               This Visit's Progress     Increase physical activity (pt-stated)        Remain active.       Depression Screen     04/30/2023   11:27 AM 04/24/2022   12:39 PM 02/20/2022   11:21 AM 04/17/2021    8:48 AM 12/06/2020    2:05 PM 04/11/2020    8:52 AM 04/21/2019    1:21 PM  PHQ 2/9 Scores  PHQ - 2 Score 0 0 0 0 0 0 0  PHQ- 9 Score     2      Fall Risk     04/30/2023   11:29 AM 04/28/2023   11:31 AM 04/01/2023    2:45 PM 03/07/2023   10:22 AM 04/24/2022   12:40 PM  Fall Risk   Falls in the past year? 1 1 0 0 0  Number falls in past yr: 0 0 0 0 0  Injury with Fall? 0 0 0 0 0  Risk for fall due to : No Fall Risks    No Fall Risks  Follow up Falls prevention discussed;Falls evaluation completed  Falls evaluation completed Falls evaluation completed Falls prevention discussed    MEDICARE RISK AT HOME:  Medicare Risk at Home Any stairs in or around the home?: Yes If so, are there any without handrails?: No Home free of loose throw rugs in walkways, pet beds, electrical cords, etc?: Yes Adequate lighting in your home to reduce risk of falls?: Yes Life alert?: No Use of a cane, walker or w/c?: No Grab bars in the bathroom?: No Shower chair or bench in shower?: No Elevated toilet seat or a handicapped toilet?: No  TIMED UP AND GO:  Was the test performed?  No  Cognitive Function: 6CIT completed    04/29/2017    1:39 PM 01/12/2016    8:49 AM  MMSE - Mini Mental State Exam  Not completed: -- --        04/30/2023  11:30 AM 04/24/2022   12:41 PM 04/17/2021    8:52 AM 04/11/2020    8:58 AM  6CIT Screen  What Year? 0 points 0 points 0 points 0 points  What month? 0 points 0 points 0 points 0 points  What time? 0 points 0 points 0 points   Count back from 20 0 points 0 points 0 points 0 points  Months in reverse 0 points 0 points 0 points 0 points  Repeat phrase 0 points 0 points 0 points 0 points  Total Score 0 points 0  points 0 points     Immunizations Immunization History  Administered Date(s) Administered   Fluad Quad(high Dose 65+) 08/31/2018, 10/11/2019   Influenza Split 10/08/2010, 10/14/2011   Influenza Whole 09/22/2009   Influenza, High Dose Seasonal PF 09/25/2015, 10/11/2016   Influenza,inj,Quad PF,6+ Mos 10/02/2012, 09/06/2013, 09/19/2014, 09/29/2017   Influenza-Unspecified 10/07/2020, 09/11/2022   PFIZER(Purple Top)SARS-COV-2 Vaccination 02/13/2019, 03/06/2019, 11/22/2019, 05/08/2020   PNEUMOCOCCAL CONJUGATE-20 04/20/2020   Pneumococcal Conjugate-13 12/14/2013   Pneumococcal Polysaccharide-23 10/14/2011   Tdap 11/08/2007, 06/04/2022   Unspecified SARS-COV-2 Vaccination 09/11/2022   Zoster Recombinant(Shingrix) 12/21/2019, 03/16/2020   Zoster, Live 11/08/2007    Screening Tests Health Maintenance  Topic Date Due   COVID-19 Vaccine (6 - 2024-25 season) 03/11/2023   Diabetic kidney evaluation - Urine ACR  07/17/2023   FOOT EXAM  07/17/2023   HEMOGLOBIN A1C  07/22/2023   INFLUENZA VACCINE  08/08/2023   Diabetic kidney evaluation - eGFR measurement  01/22/2024   OPHTHALMOLOGY EXAM  01/22/2024   Medicare Annual Wellness (AWV)  04/29/2024   DTaP/Tdap/Td (3 - Td or Tdap) 06/03/2032   Pneumonia Vaccine 52+ Years old  Completed   Hepatitis C Screening  Completed   Zoster Vaccines- Shingrix  Completed   HPV VACCINES  Aged Out   Meningococcal B Vaccine  Aged Out   Colonoscopy  Discontinued    Health Maintenance  Health Maintenance Due  Topic Date Due   COVID-19 Vaccine (6 - 2024-25 season) 03/11/2023   Health Maintenance Items Addressed:    Additional Screening:  Vision Screening: Recommended annual ophthalmology exams for early detection of glaucoma and other disorders of the eye.  Dental Screening: Recommended annual dental exams for proper oral hygiene  Community Resource Referral / Chronic Care Management: CRR required this visit?  No   CCM required this visit?   No     Plan:     I have personally reviewed and noted the following in the patient's chart:   Medical and social history Use of alcohol, tobacco or illicit drugs  Current medications and supplements including opioid prescriptions. Patient is not currently taking opioid prescriptions. Functional ability and status Nutritional status Physical activity Advanced directives List of other physicians Hospitalizations, surgeries, and ER visits in previous 12 months Vitals Screenings to include cognitive, depression, and falls Referrals and appointments  In addition, I have reviewed and discussed with patient certain preventive protocols, quality metrics, and best practice recommendations. A written personalized care plan for preventive services as well as general preventive health recommendations were provided to patient.     Dewayne Ford, LPN   01/16/6043   After Visit Summary: (MyChart) Due to this being a telephonic visit, the after visit summary with patients personalized plan was offered to patient via MyChart   Notes: Nothing significant to report at this time.

## 2023-04-30 NOTE — Patient Instructions (Addendum)
 Mr. Todd [name] , Thank you for taking time to come for your Medicare Wellness Visit. I appreciate your ongoing commitment to your health goals. Please review the following plan we discussed and let me know if I can assist you in the future.   Referrals/Orders/Follow-Ups/Clinician Recommendations:   This is a list of the screening recommended for you and due dates:  Health Maintenance  Topic Date Due   COVID-19 Vaccine (6 - 2024-25 season) 03/11/2023   Yearly kidney health urinalysis for diabetes  07/17/2023   Complete foot exam   07/17/2023   Hemoglobin A1C  07/22/2023   Flu Shot  08/08/2023   Yearly kidney function blood test for diabetes  01/22/2024   Eye exam for diabetics  01/22/2024   Medicare Annual Wellness Visit  04/29/2024   DTaP/Tdap/Td vaccine (3 - Td or Tdap) 06/03/2032   Pneumonia Vaccine  Completed   Hepatitis C Screening  Completed   Zoster (Shingles) Vaccine  Completed   HPV Vaccine  Aged Out   Meningitis B Vaccine  Aged Out   Colon Cancer Screening  Discontinued    Advanced directives: (Declined) Advance directive discussed with you today. Even though you declined this today, please call our office should you change your mind, and we can give you the proper paperwork for you to fill out.  Next Medicare Annual Wellness Visit scheduled for next year: Yes

## 2023-05-01 ENCOUNTER — Encounter: Payer: Self-pay | Admitting: Neurology

## 2023-05-01 ENCOUNTER — Telehealth: Payer: Self-pay | Admitting: Neurology

## 2023-05-01 NOTE — Telephone Encounter (Signed)
 Pt. Called Good morning. I'm experiencing different symptoms then we previously discussed which I would like to discuss:  My balance, short term memory loss, focus and concentration, typing is difficult, not always able to express myself verbally.

## 2023-05-01 NOTE — Telephone Encounter (Signed)
 Patient also sent a mychart message that has been sent to Dr. Winferd Hatter

## 2023-05-02 DIAGNOSIS — R2681 Unsteadiness on feet: Secondary | ICD-10-CM | POA: Diagnosis not present

## 2023-05-02 DIAGNOSIS — M5459 Other low back pain: Secondary | ICD-10-CM | POA: Diagnosis not present

## 2023-05-03 DIAGNOSIS — R300 Dysuria: Secondary | ICD-10-CM | POA: Diagnosis not present

## 2023-05-03 DIAGNOSIS — K5900 Constipation, unspecified: Secondary | ICD-10-CM | POA: Diagnosis not present

## 2023-05-06 DIAGNOSIS — M5459 Other low back pain: Secondary | ICD-10-CM | POA: Diagnosis not present

## 2023-05-06 DIAGNOSIS — R2681 Unsteadiness on feet: Secondary | ICD-10-CM | POA: Diagnosis not present

## 2023-05-15 DIAGNOSIS — R2681 Unsteadiness on feet: Secondary | ICD-10-CM | POA: Diagnosis not present

## 2023-05-15 DIAGNOSIS — M5459 Other low back pain: Secondary | ICD-10-CM | POA: Diagnosis not present

## 2023-05-20 ENCOUNTER — Ambulatory Visit: Payer: Medicare Other | Admitting: Family Medicine

## 2023-05-21 ENCOUNTER — Ambulatory Visit (INDEPENDENT_AMBULATORY_CARE_PROVIDER_SITE_OTHER): Admitting: Family Medicine

## 2023-05-21 VITALS — BP 128/70 | HR 77 | Temp 98.2°F | Wt 183.7 lb

## 2023-05-21 DIAGNOSIS — E785 Hyperlipidemia, unspecified: Secondary | ICD-10-CM

## 2023-05-21 DIAGNOSIS — I10 Essential (primary) hypertension: Secondary | ICD-10-CM | POA: Diagnosis not present

## 2023-05-21 DIAGNOSIS — Z7985 Long-term (current) use of injectable non-insulin antidiabetic drugs: Secondary | ICD-10-CM

## 2023-05-21 DIAGNOSIS — R2689 Other abnormalities of gait and mobility: Secondary | ICD-10-CM

## 2023-05-21 DIAGNOSIS — E119 Type 2 diabetes mellitus without complications: Secondary | ICD-10-CM | POA: Diagnosis not present

## 2023-05-21 DIAGNOSIS — Z7984 Long term (current) use of oral hypoglycemic drugs: Secondary | ICD-10-CM

## 2023-05-21 LAB — POCT GLYCOSYLATED HEMOGLOBIN (HGB A1C): Hemoglobin A1C: 6.3 % — AB (ref 4.0–5.6)

## 2023-05-21 NOTE — Patient Instructions (Addendum)
 Reduce the Moujaro, as discussed  A1C today improved to 6.3%.    Let's plan on 3 month follow up.

## 2023-05-21 NOTE — Progress Notes (Signed)
 Established Patient Office Visit  Subjective   Patient ID: Todd [name], male    DOB: [birth year]  Age: 78 y.o. MRN: [id number]  Chief Complaint  Patient presents with   Medical Management of Chronic Issues   Cough   Sore Throat    HPI   Todd [name] has history of hypertension, PVCs, type 2 diabetes, dyslipidemia he had some recent mild cough and sore throat symptoms for couple weeks.  Thinks he is may be allergy related.  Currently on Flonase.  No fever.  Type 2 diabetes.  Currently on metformin  and Mounjaro .  Previous intolerance with Ozempic .  Has done well with regard to diabetes with Mounjaro  and currently on low dose of 5 mg once weekly but is having significant appetite suppression and has lost approximately 9 pounds since last visit here.  He states he is generally only eating twice a day and has poor appetite.  Previous TSHs have been normal.  He has been battling some poor balance for some time.  Followed by neurology.  Parkinsonism without meeting criteria for Parkinson's disease. Had recent MRI brain through [facility] March 6 which showed senescent changes in the periventricular deep white matter but no focal lesions.  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  He is currently doing physical therapy for balance exercises.  Past Medical History:  Diagnosis Date   DIABETES MELLITUS, TYPE II 08/08/2009   Hiatal hernia    History of nuclear stress test    Myoview  02/2019: EF 54, apical inf and apical artifact, normal perfusion, low risk   HYPERLIPIDEMIA 08/08/2009   Palpitations    URINARY INCONTINENCE 08/08/2009   Past Surgical History:  Procedure Laterality Date   ACHILLES TENDON REPAIR  01/08/2000   rupture   APPENDECTOMY  01/07/1974   CARDIOVASCULAR STRESS TEST  11/02/2003   EF 57%   MENISCUS REPAIR Left    TONSILLECTOMY  01/08/1959   US  ECHOCARDIOGRAPHY  02/10/2001   EF 60-65%    reports that he quit smoking about 46 years ago. His smoking  use included cigarettes. He started smoking about 56 years ago. He has a 20 pack-year smoking history. He has never used smokeless tobacco. He reports current alcohol use. He reports that he does not use drugs. family history includes Diabetes in his sister; Mental illness in his mother. Allergies  Allergen Reactions   Jardiance  [Empagliflozin ]     Yeast infections   Diphenhydramine-Phenylephrine Other (See Comments)   Lisinopril Swelling   Pseudoephedrine Hcl Other (See Comments)   Sudafed Pe Sinus Cong Day-Nght  [Diphenhydramine-Phenylephrine] Other (See Comments)   Triprolidine-Pse Rash   Actifed Cold-Sinus     rash   Pseudoephedrine     REACTION: hives   Triprolidine Hcl     Review of Systems  Constitutional:  Positive for weight loss. Negative for chills and fever.  Respiratory:  Negative for shortness of breath.   Cardiovascular:  Negative for chest pain.  Gastrointestinal:  Negative for abdominal pain and diarrhea.  Genitourinary:  Negative for dysuria.      Objective:      BP 128/70 (BP Location: Left Arm, Cuff Size: Normal)   Pulse 77   Temp 98.2 F (36.8 C) (Oral)   Wt 183 lb 11.2 oz (83.3 kg)   SpO2 95%   BMI 23.59 kg/m  BP Readings from Last 3 Encounters:  05/21/23 128/70  03/07/23 128/78  03/04/23 126/62   Wt Readings from Last 3 Encounters:  05/21/23 183 lb 11.2  oz (83.3 kg)  04/30/23 189 lb (85.7 kg)  03/07/23 194 lb 6.4 oz (88.2 kg)      Physical Exam Vitals reviewed.  Constitutional:      General: He is not in acute distress.    Appearance: He is not ill-appearing.  Cardiovascular:     Rate and Rhythm: Normal rate and regular rhythm.  Pulmonary:     Effort: Pulmonary effort is normal.     Breath sounds: Normal breath sounds. No wheezing or rales.  Musculoskeletal:     Cervical back: Neck supple.  Lymphadenopathy:     Cervical: No cervical adenopathy.  Neurological:     Mental Status: He is alert.      Results for orders placed or  performed in visit on 05/21/23  POC HgB A1c  Result Value Ref Range   Hemoglobin A1C 6.3 (A) 4.0 - 5.6 %   HbA1c POC (<> result, manual entry)     HbA1c, POC (prediabetic range)     HbA1c, POC (controlled diabetic range)      Last CBC Lab Results  Component Value Date   WBC 6.6 02/07/2020   HGB 14.5 02/07/2020   HCT 44.0 02/07/2020   MCV 84.0 02/07/2020   RDW 14.2 02/07/2020   PLT 271.0 02/07/2020   Last metabolic panel Lab Results  Component Value Date   GLUCOSE 109 (H) 01/22/2023   NA 142 01/22/2023   K 4.0 01/22/2023   CL 103 01/22/2023   CO2 31 01/22/2023   BUN 14 01/22/2023   CREATININE 0.89 01/22/2023   GFR 83.33 01/22/2023   CALCIUM  10.0 01/22/2023   PROT 7.4 01/22/2023   ALBUMIN 4.6 01/22/2023   LABGLOB 2.6 03/12/2016   AGRATIO 1.7 03/12/2016   BILITOT 0.3 01/22/2023   ALKPHOS 39 01/22/2023   AST 13 01/22/2023   ALT 18 01/22/2023   Last lipids Lab Results  Component Value Date   CHOL 134 01/22/2023   HDL 43.70 01/22/2023   LDLCALC 57 01/22/2023   LDLDIRECT 84.0 06/11/2018   TRIG 165.0 (H) 01/22/2023   CHOLHDL 3 01/22/2023   Last hemoglobin A1c Lab Results  Component Value Date   HGBA1C 6.3 (A) 05/21/2023   Last thyroid  functions Lab Results  Component Value Date   TSH 1.49 11/13/2021      The 10-year ASCVD risk score (Arnett DK, et al., 2019) is: 23.7%    Assessment & Plan:   #1 type 2 diabetes controlled with A1c 6.3%.  Unfortunately, has had progressive weight loss and poor appetite on Mounjaro .  He remains on metformin .  Previous intolerance with Jardiance .  He also recalls previous intolerance with Ozempic .  We discussed scaling back his Mounjaro  and possibly even leaving off.  He has never had extremely poor control of diabetes even prior to starting GLP-1.  We definitely do not want to see him have any further weight loss at this point.  He will try scaling back to once every other week and then reassess in 3 months.  If weight not  improving at that point consider discontinuation of GLP-1  #2 hyperlipidemia.  Patient on rosuvastatin  10 mg daily.  Cholesterol 3 months ago 134 with LDL 57.  Continue low saturated fat diet.  #3 tremor with parkinsonian syndrome.  Followed by neurology.  He has pending follow-up for neurocognitive testing as he has had some concerns regarding memory change.  #4 recent sore throat and cough.  No fever.  Nonfocal exam.  Question allergic.  Continue Flonase  Return in about 3 months (around 08/21/2023).    Glean Lamy, MD

## 2023-05-23 DIAGNOSIS — R0981 Nasal congestion: Secondary | ICD-10-CM | POA: Diagnosis not present

## 2023-05-23 DIAGNOSIS — J069 Acute upper respiratory infection, unspecified: Secondary | ICD-10-CM | POA: Diagnosis not present

## 2023-05-23 DIAGNOSIS — R051 Acute cough: Secondary | ICD-10-CM | POA: Diagnosis not present

## 2023-05-23 DIAGNOSIS — R2681 Unsteadiness on feet: Secondary | ICD-10-CM | POA: Diagnosis not present

## 2023-05-23 DIAGNOSIS — M5459 Other low back pain: Secondary | ICD-10-CM | POA: Diagnosis not present

## 2023-05-26 ENCOUNTER — Encounter: Payer: Self-pay | Admitting: Family Medicine

## 2023-05-28 ENCOUNTER — Ambulatory Visit: Payer: Self-pay | Admitting: Psychology

## 2023-05-28 ENCOUNTER — Ambulatory Visit: Admitting: Psychology

## 2023-05-28 DIAGNOSIS — R419 Unspecified symptoms and signs involving cognitive functions and awareness: Secondary | ICD-10-CM

## 2023-05-28 DIAGNOSIS — R4189 Other symptoms and signs involving cognitive functions and awareness: Secondary | ICD-10-CM

## 2023-05-28 NOTE — Progress Notes (Signed)
   Psychometrician Note   Cognitive testing was administered to [facility] by Arnulfo Larch, B.S. (psychometrist) under the supervision of Dr. Janice Meeter, Psy.D., licensed psychologist on 05/28/2023. Mr. Hakeem did not appear overtly distressed by the testing session per behavioral observation or responses across self-report questionnaires. Rest breaks were offered.    The battery of tests administered was selected by Dr. Janice Meeter, Psy.D. with consideration to Mr. Wentling's current level of functioning, the nature of his symptoms, emotional and behavioral responses during interview, level of literacy, observed level of motivation/effort, and the nature of the referral question. This battery was communicated to the psychometrist. Communication between Dr. Janice Meeter, Psy.D. and the psychometrist was ongoing throughout the evaluation and Dr. Janice Meeter, Psy.D. was immediately accessible at all times. Dr. Janice Meeter, Psy.D. provided supervision to the psychometrist on the date of this service to the extent necessary to assure the quality of all services provided.    Yarel [place] III will return within approximately 1-2 weeks for an interactive feedback session with Dr. Donavon Fudge at which time his test performances, clinical impressions, and treatment recommendations will be reviewed in detail. Mr. Bozzi understands he can contact our office should he require our assistance before this time.  A total of 125 minutes of billable time were spent face-to-face with Mr. Yon by the psychometrist. This includes both test administration and scoring time. Billing for these services is reflected in the clinical report generated by Dr. Janice Meeter, Psy.D.  This note reflects time spent with the psychometrician and does not include test scores or any clinical interpretations made by Dr. Donavon Fudge. The full report will follow in a separate note.

## 2023-05-29 DIAGNOSIS — M5459 Other low back pain: Secondary | ICD-10-CM | POA: Diagnosis not present

## 2023-05-29 DIAGNOSIS — R2681 Unsteadiness on feet: Secondary | ICD-10-CM | POA: Diagnosis not present

## 2023-05-29 NOTE — Progress Notes (Addendum)
 NEUROPSYCHOLOGICAL EVALUATION [place]  [place] Department of Neurology  Date of Evaluation: 05/28/2023  REASON FOR REFERRAL   Todd [name] is a 78 year old, left-handed, Black male with 16 years of formal education. He was referred for neuropsychological evaluation by his neurologist, Ivette Marks Tat, D.O., to assess current neurocognitive functioning, document potential cognitive deficits, and assist with treatment planning. This is his first neuropsychological evaluation.  SUMMARY OF RESULTS   Premorbid cognitive abilities are estimated to be in the average range based on word reading and sociodemographic factors. Consistent with this estimate, performance today was generally intact with the exception of relative weaknesses noted on aspects of novel problem solving and visual learning/memory. Regarding the latter, he demonstrated poor acquisition of visual information (i.e., shapes), which impacted his ability to later recall and recognize the information after a delay. Remaining performances were otherwise intact, including across measures of attention/working memory, processing speed, executive functioning, language, visuospatial abilities, and verbal learning/memory.   On self-report questionnaires, he did not endorse clinically-elevated levels of depression or anxiety.  DIAGNOSTIC IMPRESSION   Results of the current evaluation indicated a primary relative weakness in visual learning/memory with otherwise generally normal cognitive functioning. He does not show evidence of a neurocognitive disorder at this time. Deficits in visuospatial skills--observed only on a learning/memory test today--may reflect a longstanding relative weakness but can also be an early sign of cognitive dysfunction in Parkinson's disease (PD). Although he does not currently meet criteria for PD, neurology has expressed concern about the potential for its development in the future, especially given  a positive DaTscan. Other factors that may contribute to the overall clinical picture include normal aging, mild-to-moderate cerebrovascular disease, and recent stress/worry. Results serve as a baseline for future comparison.  ICD-10 Codes: R41.9 Cognitive changes  RECOMMENDATIONS   In consultation with your doctor, schedule cognitive reevaluation on an as-needed basis to assess for cognitive decline and update treatment recommendations. Reevaluation should occur during a period of medical and affective stability.  Monitor mood given reports of stress and worry, especially since that emotional distress can exacerbate cognitive difficulties. If symptoms begin to interfere with daily functioning, you may wish to consider exploring treatment options, such as counseling and/or mood medications.  Continue managing vascular risk factors through a heart healthy diet (e.g., MIND, Mediterranean), physician-approved physical activity, and medication adherence.  Aim to regularly participate in activities that you find enjoyable and fulfilling, whether that be hobbies, socializing with loved ones, or being outdoors. This can improve mood, increase motivation, and offer cognitive stimulation.  Given occasional difficulties falling asleep, you may benefit from the implementation of sleep hygiene techniques, including:  -Go to bed and get up at the same time each day to help your body establish a regular rhythm.  -Establish and maintain a bedtime routine. Certain activities such as stretching, meditating, listening to soft music, or reading ~15 minutes before bedtime can be a great way to regularly get your brain and body ready for sleep.  -Avoid taking naps during the day.  -Avoid alcohol and caffeine for 5 or 6 hours before going to bed.  -Get regular exercise, but not in the hours before bedtime.  -Use comfortable bedding and maintain a cool temperature in your bedroom  -Block out light and  distracting noise.  -Avoid watching television or using your phone/computer in bed.  -Avoid staying in bed if you have difficulty falling asleep. If you have not been able to get to sleep after about 20 minutes  or more, get up and do something calming or boring until you feel sleepy, then return to bed and try again.  Consider implementing compensatory strategies to maximize independence and maintain daily functioning. Examples include:   -Adhere to routine. Compensatory strategies work best when they are used consistently. Use a planner, calendar, or white board that has the schedule and important events for the day clearly listed to reference and cross off when tasks are complete.   -Ask for written information, especially if it is new or unfamiliar (e.g., information provided at a doctor's appointment).   -Create an organized environment. Keep items that can be easily misplaced in a sensible location and get into the habit of always returning the items to those places.   -Pay attention and reduce distractions. Make a point of focusing attention on information you want to remember. One-on-one interaction is more likely to facilitate attention and minimize distraction. Make eye contact and repeat the information out loud after you hear it. Reduce interruptions or distractions especially when attempting to learn new information.   -Create associations. When learning something new, think about and understand the information. Explain it in your own words or try to associate it with something you already know. Take notes to help remember important details.  -Evaluate goals and plan accordingly. When confronted by many different tasks, begin by making a list that prioritizes each task and estimates the time it will take to complete. Break down complicated tasks into smaller, more manageable steps.   -Focus on one task at a time and complete each task before starting another. Avoid  multitasking.  DISPOSITION   Patient will follow up with the referring provider, Dr. Winferd Hatter. No follow-up neuropsychological testing was scheduled at this time. Please feel free to refer the patient for repeated evaluation if he shows a significant change in neurocognitive status. He will be provided verbal feedback in approximately one week regarding the findings and impression during this visit.  The remainder of the report includes the details of the patient's background and a table of results from the current evaluation, which support the summary and recommendations described above.  BACKGROUND   History of Presenting Illness: The following information was obtained from a review of medical records and an interview with the patient. Patient is under the care of Dr. Winferd Hatter for the management of parkinsonism--manifesting as a subtle left upper extremity rest tremor and slight dragging of the right leg--as well as for concern regarding memory changes. During the most recent visit on 04/01/2023, it was noted that while the patient demonstrates very mild parkinsonian features, he does not currently meet diagnostic criteria for Parkinson's disease. 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  Cognitive Functioning: During today's appointment, the patient reported concerns primarily related to short-term memory over the past several months. Although he refers to these issues as memory problems, his descriptions suggest attention-related difficulties--for example, not noticing items in plain sight or forgetting the reason for entering a room. In a prior message exchange with Dr. Winferd Hatter, he also noted difficulties with focus/concentration and verbal expression; however, when asked directly today, he denied concerns in these areas. He additionally denied issues with processing  speed, navigation, and executive functioning. 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 Aaron Aas  Physical Functioning: Patient denied major concerns with sleep but noted  occasional difficulty falling asleep due to persistent worry and stress. He noted one episode of acting out his dreams. He described a reduced appetite, which he attributed to his medications. No changes to sense of taste or smell were reported. Vision and hearing are stable, although he needs an updated prescription for his glasses. He endorsed reduced balance and is currently in physical therapy; he has experienced tripping and stumbling but no falls. He also notices shuffling his feet when walking. Additionally, he has a tremor in his left hand, which he can suppress when he focuses on it.  Emotional Functioning: Patient expressed concern about recent life stressors and medical issues. He described himself as someone who prefers to understand what is happening so he can take appropriate action. He denied any suicidal ideation.  Imaging: Per Dr. Winferd Hatter (04/01/2023 note), "MRI brain was completed at [facility] in [facility] and patient brought me those films and I personally reviewed them.There was mild to moderate small vessel disease. 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 No history of stroke, CNS infection, head injury, or seizure was reported.  Current Medications: Per patient, Coenzyme Q10, fenofibrate , fiber, finasteride, metformin , Mounjaro , mirabegron, pravastatin , rosuvastatin , solifenacin, vitamin B12, and vitamin D3.  Functional Status: Patient independently performs all basic and instrumental (i.e.,  driving, finances, medications) activities of daily living without difficulties. Tremors negatively affect penmanship. He otherwise denies significant difficulty with fine motor tasks (e.g., buttoning a shirt, shaving).  Family Neurological History: Unremarkable.  Psychiatric History: History of depression, anxiety, prior mental health treatment, suicidal ideation, hallucinations, and psychiatric hospitalizations was not reported.  Substance Use History: Patient reported infrequent alcohol consumption. Current use of nicotine, marijuana, and illicit substances was denied. He reported a history of heavy smoking, averaging three packs of cigarettes per day, but stated that he quit approximately 50 years ago.  Social and Developmental History: Patient was born in [place], [place]. History of perinatal complications and developmental delays was not reported. He is currently married (previously divorced twice) and lives with his wife in their private residence. He has two children from his first marriage, and his wife has three children from a previous relationship.  Educational and Occupational History: No history of childhood learning disability, special education services, or grade retention was reported. 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  BEHAVIORAL OBSERVATIONS   Patient arrived on time and was unaccompanied. He ambulated independently and without gait disturbance. He was alert and fully oriented. He was appropriately groomed and dressed for the setting. Rest tremor was observed in his left hand. Vision (with glasses) and hearing were adequate for testing purposes. Speech was of normal rate, prosody, and volume. No conversational word-finding difficulties, paraphasic errors, or dysarthria were observed. Comprehension was conversationally intact. Thought processes were  linear, logical, and coherent. Thought content was organized and devoid of delusions. Insight appeared intact. Affect was even and congruent with mood. He was cooperative and gave adequate effort during testing, including on standalone and embedded measures of performance validity. Results are thought to accurately reflect his cognitive functioning at this time.  NEUROPSYCHOLOGICAL TESTING RESULTS   Tests Administered: Animal Naming Test; Brief Visuospatial Memory Test-Revised (BVMT-R) - Form 1; Controlled  Oral Word Association Test (COWAT): FAS; Delis-Kaplan Executive Function System (D-KEFS) - Subtest(s): Color-Word Interference Test; Geriatric Anxiety Scale-10 Item (GAS-10); Geriatric Depression Scale Short Form (GDS-SF); Hooper Visual Organization Test (VOT); Hopkins Verbal Learning Test Revised (HVLT-R) - From 1; Judgment of Line Orientation (JLO) - Form H; Neuropsychological Assessment Battery (NAB) - Subtest(s): Naming Form 1; Rey Complex Figure Test (RCFT) - copy only; Standalone performance validity test (PVT); Test of Premorbid Functioning (TOPF); Trail Making Test (TMT); Wechsler Adult Intelligence Scale Fifth Edition (WAIS-5) - Subtest(s): Similarities, Clinical cytogeneticist, Digit Sequencing, Coding, Running Digits, Symbol Search, Symbol Span, Naming Speed Quantity; Wechsler Memory Scale Fourth Edition (WMS-IV) - Subtest(s): Logical Memory (LM); and [place]  Card Sorting Test 64 Card Version (WCST-64).  Test results are provided in the table below. Whenever possible, the patient's scores were compared against age-, sex-, and education-corrected normative samples. Interpretive descriptions are based on the AACN consensus conference statement on uniform labeling (Guilmette et al., 2020).  PREMORBID FUNCTIONING RAW  RANGE  TOPF 41 StdS=100 Average  ATTENTION & WORKING MEMORY RAW  RANGE  WAIS-5 Digit Sequencing -- ss=8 Average  WAIS-5 Running Digits -- ss=10 Average  WAIS-5 Symbol Span -- ss=6 Low  Average  PROCESSING SPEED RAW  RANGE  Trails A 29''0e T=59 High Average  WAIS-5 Coding  -- ss=9 Average  WAIS-5 Symbol Search -- ss=8 Average  WAIS-5 Naming Speed Quantity -- ss=11 Average  DKEFS CWIT Color Naming 25''0e ss=13 High Average  DKEFS CWIT Word Reading 18''0e ss=14 High Average  EXECUTIVE FUNCTION RAW  RANGE  Trails B 118''2e T=52 Average  WAIS-5 Similarities -- ss=10 Average  COWAT Letter Fluency 11+11+17 T=54 Average  DKEFS CWIT Inhibition 81''1e ss=9 Average  DKEFS CWIT Inhibition/Switching 63''2e ss=12 High Average  WCST-64 Total Errors 36 T=35 Below Average  WCST-64 Perseverative Errors 24 T=35 Below Average  WCST-64 Nonperseverative Errors 12 T=40 Low Average  WCST-64 Categories Completed 1 11-16%ile Low Average  WCSR-64 FMS 1 -- --  LANGUAGE RAW  RANGE  COWAT Letter Fluency 11+11+17 T=54 Average  Animal Naming Test 17 T=55 Average  NAB Naming Test 31/31 T=59 WNL  VISUOSPATIAL RAW    WAIS-5 Block Design -- ss=11 Average  HVOT 26/30 T=50 Average  JLO C/S=25/30 56%ile Average  RCFT Copy 31/36 >16%ile WNL  BVMT-R Copy Trial 11/12 -- WNL  VERBAL LEARNING & MEMORY RAW  RANGE  HVLT Learning Trials (5+6+7)/36 T=40 Low Average  HVLT Delayed Recall 5/12 T=38 Low Average  HVLT Recognition Hits 11 -- --  HVLT Recognition False Positives 2 -- --  HVLT Discrimination Index 9 T=43 Average  WMS-IV LM-I  (7+10+7)/53 ss=8 Average  WMS-IV LM-II  (8+9)/39 ss=10 Average  WMS-IV LM Recognition  (8+13)/23 >75%ile High Average to Exceptionally High  VISUAL LEARNING & MEMORY RAW  RANGE  BVMT-R Total Recall (3+3+5)/36 T=34 Below Average  BVMT-R Delayed Recall 4/12 T=35 Below Average  BVMT-R Percent Retained 80 >16%ile WNL  BVMT-R Recognition Hits 6 >16%ile WNL  BVMT-R Recognition False Alarms 3 <1%ile Exceptionally Low  BVMT-R Recognition Discrimination Index 3 3-5%ile Below Average  QUESTIONNAIRES RAW  RANGE  GDS-SF 2 -- Minimal  GAS-10 4 -- Minimal  *Note: ss = scaled  score; StdS = standard score; T = t-score; C/S = corrected raw score; WNL = within normal limits; BNL= below normal limits; D/C = discontinued. Scores from skewed distributions are typically interpreted as WNL (>=16th %ile) or BNL (<16th %ile).   INFORMED CONSENT   Patient was provided with a verbal description  of the nature and purpose of the neuropsychological evaluation. Also reviewed were the foreseeable risks and/or discomforts and benefits of the procedure, limits of confidentiality, and mandatory reporting requirements of this provider. Patient was given the opportunity to have their questions answered. Oral consent to participate was provided by the patient.   This report was prepared as part of a clinical evaluation and is not intended for forensic use.  SERVICE   This evaluation was conducted by Janice Meeter, Psy.D. In addition to time spent directly with the patient, total professional time (180 minutes) includes record review, integration of relevant medical history, test selection, interpretation of findings, and report preparation. A technician, Arnulfo Larch, B.S., provided testing and scoring assistance for 125 minutes.  Psychiatric Diagnostic Evaluation Services (Professional): 16109 x 1 Neuropsychological Testing Evaluation Services (Professional): 60454 x 1 Neuropsychological Testing Evaluation Services (Professional): 09811 x 1 Neuropsychological Test Administration and Scoring Radiographer, therapeutic): [phone] x 1 Neuropsychological Test Administration and Scoring (Technician): [phone] x 3  This report was generated using voice recognition software. While this document has been carefully reviewed, transcription errors may be present. I apologize in advance for any inconvenience. Please contact me if further clarification is needed.            Janice Meeter, Psy.D.             Neuropsychologist

## 2023-06-04 ENCOUNTER — Ambulatory Visit: Payer: Medicare Other | Admitting: Neurology

## 2023-06-04 ENCOUNTER — Ambulatory Visit: Admitting: Psychology

## 2023-06-04 DIAGNOSIS — R419 Unspecified symptoms and signs involving cognitive functions and awareness: Secondary | ICD-10-CM | POA: Diagnosis not present

## 2023-06-04 NOTE — Progress Notes (Signed)
   NEUROPSYCHOLOGY FEEDBACK SESSION [place]  [place] Department of Neurology  Date of Feedback Session: 06/04/2023  REASON FOR REFERRAL   Todd [name] is a 78 year old, left-handed, Black male with 16 years of formal education. He was referred for neuropsychological evaluation by his neurologist, Ivette Marks Tat, D.O., to assess current neurocognitive functioning, document potential cognitive deficits, and assist with treatment planning. This is his first neuropsychological evaluation.  FEEDBACK   Patient completed a comprehensive neuropsychological evaluation on 05/28/2023. Please refer to that encounter for the full report and recommendations. Briefly, results indicated a primary relative weakness in visual learning/memory with otherwise generally normal cognitive functioning. He does not show evidence of a neurocognitive disorder at this time. Deficits in visuospatial skills--observed only on a learning/memory test today--may reflect a longstanding relative weakness but can also be an early sign of cognitive dysfunction in Parkinson's disease (PD). Although he does not currently meet criteria for PD, neurology has expressed concern about the potential for its development in the future, especially given a positive DaTscan. Other factors that may contribute to the overall clinical picture include normal aging, mild-to-moderate cerebrovascular disease, and recent stress/worry.    Today, the patient was unaccompanied. He was provided verbal feedback regarding the findings and impression during this visit, and his questions were answered. A copy of the report was provided at the conclusion of the visit.  DISPOSITION   Patient will follow up with the referring provider, Dr. Winferd Hatter. No follow-up neuropsychological testing was scheduled at this time. Please feel free to refer the patient for repeated evaluation if he shows a significant change in neurocognitive status.  SERVICE    This feedback session was conducted by Janice Meeter, Psy.D. One unit of 16[address] (35 minutes) was billed for Dr. Donavon Fudge' time spent in preparing, conducting, and documenting the current feedback session.  This report was generated using voice recognition software. While this document has been carefully reviewed, transcription errors may be present. I apologize in advance for any inconvenience. Please contact me if further clarification is needed.

## 2023-06-06 DIAGNOSIS — M5459 Other low back pain: Secondary | ICD-10-CM | POA: Diagnosis not present

## 2023-06-06 DIAGNOSIS — R2681 Unsteadiness on feet: Secondary | ICD-10-CM | POA: Diagnosis not present

## 2023-06-18 DIAGNOSIS — K5900 Constipation, unspecified: Secondary | ICD-10-CM | POA: Diagnosis not present

## 2023-06-19 ENCOUNTER — Other Ambulatory Visit: Payer: Self-pay | Admitting: Urology

## 2023-07-01 ENCOUNTER — Encounter: Payer: Self-pay | Admitting: Cardiovascular Disease

## 2023-07-01 DIAGNOSIS — I493 Ventricular premature depolarization: Secondary | ICD-10-CM

## 2023-07-01 DIAGNOSIS — E782 Mixed hyperlipidemia: Secondary | ICD-10-CM

## 2023-07-01 DIAGNOSIS — I10 Essential (primary) hypertension: Secondary | ICD-10-CM

## 2023-07-11 ENCOUNTER — Other Ambulatory Visit: Payer: Self-pay | Admitting: Urology

## 2023-08-04 ENCOUNTER — Other Ambulatory Visit: Payer: Self-pay | Admitting: Urology

## 2023-08-12 DIAGNOSIS — I10 Essential (primary) hypertension: Secondary | ICD-10-CM | POA: Diagnosis not present

## 2023-08-12 DIAGNOSIS — E782 Mixed hyperlipidemia: Secondary | ICD-10-CM | POA: Diagnosis not present

## 2023-08-12 DIAGNOSIS — I493 Ventricular premature depolarization: Secondary | ICD-10-CM | POA: Diagnosis not present

## 2023-08-20 ENCOUNTER — Ambulatory Visit: Admitting: Family Medicine

## 2023-08-20 ENCOUNTER — Encounter: Payer: Self-pay | Admitting: Family Medicine

## 2023-08-20 ENCOUNTER — Ambulatory Visit: Payer: Self-pay | Admitting: Family Medicine

## 2023-08-20 VITALS — BP 126/60 | HR 73 | Temp 97.7°F | Wt 184.1 lb

## 2023-08-20 DIAGNOSIS — E785 Hyperlipidemia, unspecified: Secondary | ICD-10-CM

## 2023-08-20 DIAGNOSIS — Z79899 Other long term (current) drug therapy: Secondary | ICD-10-CM | POA: Diagnosis not present

## 2023-08-20 DIAGNOSIS — B351 Tinea unguium: Secondary | ICD-10-CM

## 2023-08-20 DIAGNOSIS — Z7985 Long-term (current) use of injectable non-insulin antidiabetic drugs: Secondary | ICD-10-CM

## 2023-08-20 DIAGNOSIS — E119 Type 2 diabetes mellitus without complications: Secondary | ICD-10-CM

## 2023-08-20 LAB — HEPATIC FUNCTION PANEL
ALT: 16 U/L (ref 0–53)
AST: 14 U/L (ref 0–37)
Albumin: 4.4 g/dL (ref 3.5–5.2)
Alkaline Phosphatase: 40 U/L (ref 39–117)
Bilirubin, Direct: 0.1 mg/dL (ref 0.0–0.3)
Total Bilirubin: 0.4 mg/dL (ref 0.2–1.2)
Total Protein: 7.6 g/dL (ref 6.0–8.3)

## 2023-08-20 LAB — POCT GLYCOSYLATED HEMOGLOBIN (HGB A1C): Hemoglobin A1C: 6.2 % — AB (ref 4.0–5.6)

## 2023-08-20 MED ORDER — TERBINAFINE HCL 250 MG PO TABS: 250.0000 mg | ORAL_TABLET | Freq: Every day | ORAL | 0 refills | Status: DC

## 2023-08-20 NOTE — Progress Notes (Signed)
 Established Patient Office Visit  Subjective   Patient ID: Todd [name], male    DOB: 1945-01-14  Age: 78 y.o. MRN: [id number]  Chief Complaint  Patient presents with   Medical Management of Chronic Issues    HPI   Todd [name] is seen today for medical follow-up.  He has history of hypertension, PVCs, type 2 diabetes, dyslipidemia, BPH  Diabetes been well-controlled.  He is currently on Mounjaro  5 mg every other week and has reduced his metformin  to 1000 mg once daily.  His A1c's have been steadily improving.  His weight is finally plateaued after some initial weight loss on Mounjaro .  Feels well overall.  Plans to start going to a gym more regularly.  Has been taking some protein supplementation as well.  Relates some discoloration at the base of the left great toe.  No pain.  No drainage.  No warmth.  He has thickened dysmorphic changes of the left great toenail.  Denies any history of trauma.  Continues to have some balance issues.  Has seen physical therapy.  Has been given several home exercises.  Does not apparently meet criteria for Parkinson disease but neurology has expressed concern about potential for developing this.  He had positive DaTscan  Past Medical History:  Diagnosis Date   DIABETES MELLITUS, TYPE II 08/08/2009   Hiatal hernia    History of nuclear stress test    Myoview  02/2019: EF 54, apical inf and apical artifact, normal perfusion, low risk   HYPERLIPIDEMIA 08/08/2009   Palpitations    URINARY INCONTINENCE 08/08/2009   Past Surgical History:  Procedure Laterality Date   ACHILLES TENDON REPAIR  01/08/2000   rupture   APPENDECTOMY  01/07/1974   CARDIOVASCULAR STRESS TEST  11/02/2003   EF 57%   MENISCUS REPAIR Left    TONSILLECTOMY  01/08/1959   US  ECHOCARDIOGRAPHY  02/10/2001   EF 60-65%    reports that he quit smoking about 46 years ago. His smoking use included cigarettes. He started smoking about 56 years ago. He has a 20 pack-year smoking history. He has  never used smokeless tobacco. He reports current alcohol use. He reports that he does not use drugs. family history includes Diabetes in his sister; Mental illness in his mother. Allergies  Allergen Reactions   Jardiance  [Empagliflozin ]     Yeast infections   Diphenhydramine-Phenylephrine Other (See Comments)   Lisinopril Swelling   Pseudoephedrine Hcl Other (See Comments)   Sudafed Pe Sinus Cong Day-Nght  [Diphenhydramine-Phenylephrine] Other (See Comments)   Triprolidine-Pse Rash   Actifed Cold-Sinus     rash   Pseudoephedrine     REACTION: hives   Triprolidine Hcl     Review of Systems  Constitutional:  Negative for malaise/fatigue.  Eyes:  Negative for blurred vision.  Respiratory:  Negative for shortness of breath.   Cardiovascular:  Negative for chest pain.  Gastrointestinal:  Negative for abdominal pain.  Neurological:  Negative for dizziness, weakness and headaches.      Objective:     BP 126/60   Pulse 73   Temp 97.7 F (36.5 C) (Oral)   Wt 184 lb 1.6 oz (83.5 kg)   SpO2 98%   BMI 23.64 kg/m  BP Readings from Last 3 Encounters:  08/20/23 126/60  05/21/23 128/70  03/07/23 128/78   Wt Readings from Last 3 Encounters:  08/20/23 184 lb 1.6 oz (83.5 kg)  05/21/23 183 lb 11.2 oz (83.3 kg)  04/30/23 189 lb (85.7 kg)  Physical Exam Vitals reviewed.  Constitutional:      General: He is not in acute distress.    Appearance: He is well-developed. He is not ill-appearing.  HENT:     Right Ear: External ear normal.     Left Ear: External ear normal.  Eyes:     Pupils: Pupils are equal, round, and reactive to light.  Neck:     Thyroid : No thyromegaly.  Cardiovascular:     Rate and Rhythm: Normal rate and regular rhythm.  Pulmonary:     Effort: Pulmonary effort is normal. No respiratory distress.     Breath sounds: Normal breath sounds. No wheezing or rales.  Musculoskeletal:     Cervical back: Neck supple.  Skin:    Comments: Left great toenail  reveals dysmorphic changes.  He has some abnormal thickening.  Has some hyperpigmentation of skin changes at the base of the nail.  No drainage.  No warmth.  Nontender.  No ulcerations.  Neurological:     Mental Status: He is alert and oriented to person, place, and time.      Results for orders placed or performed in visit on 08/20/23  POC HgB A1c  Result Value Ref Range   Hemoglobin A1C 6.2 (A) 4.0 - 5.6 %   HbA1c POC (<> result, manual entry)     HbA1c, POC (prediabetic range)     HbA1c, POC (controlled diabetic range)      Last CBC Lab Results  Component Value Date   WBC 6.6 02/07/2020   HGB 14.5 02/07/2020   HCT 44.0 02/07/2020   MCV 84.0 02/07/2020   RDW 14.2 02/07/2020   PLT 271.0 02/07/2020   Last metabolic panel Lab Results  Component Value Date   GLUCOSE 109 (H) 01/22/2023   NA 142 01/22/2023   K 4.0 01/22/2023   CL 103 01/22/2023   CO2 31 01/22/2023   BUN 14 01/22/2023   CREATININE 0.89 01/22/2023   GFR 83.33 01/22/2023   CALCIUM  10.0 01/22/2023   PROT 7.4 01/22/2023   ALBUMIN 4.6 01/22/2023   LABGLOB 2.6 03/12/2016   AGRATIO 1.7 03/12/2016   BILITOT 0.3 01/22/2023   ALKPHOS 39 01/22/2023   AST 13 01/22/2023   ALT 18 01/22/2023   Last lipids Lab Results  Component Value Date   CHOL 134 01/22/2023   HDL 43.70 01/22/2023   LDLCALC 57 01/22/2023   LDLDIRECT 84.0 06/11/2018   TRIG 165.0 (H) 01/22/2023   CHOLHDL 3 01/22/2023   Last hemoglobin A1c Lab Results  Component Value Date   HGBA1C 6.2 (A) 08/20/2023      The 10-year ASCVD risk score (Arnett DK, et al., 2019) is: 23.1%    Assessment & Plan:   #1 type 2 diabetes controlled with A1c 6.2% on once daily metformin  and low-dose Mounjaro .  Continue current regimen.  Continue yearly diabetic eye exam.  Consider urine microalbumin with next labs  #2 dysmorphic changes left great toenail.  Suspect onychomycosis.  Check hepatic panel.  If normal consider 57-month course of Lamisil  250 mg daily.   He is aware it may take several months to see significant nail changes.  #3 hyperlipidemia.  Patient on rosuvastatin  10 mg daily.  Tolerating well.  Set up 357-month follow-up and recheck lipids and CMP at that time  #4 history of BPH currently stable on finasteride.  Followed by urology . Return in about 4 months (around 12/20/2023).    Wolm Scarlet, MD

## 2023-09-03 DIAGNOSIS — R350 Frequency of micturition: Secondary | ICD-10-CM | POA: Diagnosis not present

## 2023-09-03 DIAGNOSIS — R3915 Urgency of urination: Secondary | ICD-10-CM | POA: Diagnosis not present

## 2023-09-18 ENCOUNTER — Encounter: Payer: Self-pay | Admitting: Family Medicine

## 2023-09-19 DIAGNOSIS — Z23 Encounter for immunization: Secondary | ICD-10-CM | POA: Diagnosis not present

## 2023-09-19 MED ORDER — COVID-19 MRNA VACC (MODERNA) 50 MCG/0.5ML IM SUSY: 0.5000 mL | PREFILLED_SYRINGE | Freq: Once | INTRAMUSCULAR | 0 refills | Status: DC

## 2023-09-19 MED ORDER — COVID-19 MRNA VACC (MODERNA) 50 MCG/0.5ML IM SUSY: 0.5000 mL | PREFILLED_SYRINGE | Freq: Once | INTRAMUSCULAR | 0 refills | Status: AC

## 2023-09-24 ENCOUNTER — Other Ambulatory Visit: Payer: Self-pay | Admitting: Family Medicine

## 2023-10-01 NOTE — Progress Notes (Deleted)
 Assessment/Plan:   1.  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             -We discussed that while he does have some very mild parkinsonian features, he does not meet the criteria for Parkinsons disease.  His DaTscan is positive, which would suggest that he likely will meet full criteria in the future.  That being said, at this point I would not recommend taking medication currently.  I would recommend safe, cardiovascular exercise of 150 minutes/week.  Offered physical therapy, and he states he is currently working with a physical therapist and he would like to continue working with his therapist.  He is going to increase his cardiovascular component, however.             - I did ask him to get a copy of his MRI brain, as I only have the report currently.   2.  Memory change             -Neurocognitive testing in May, 2025 was unremarkable.   3.  Hypertension/hyperlipidemia             -Following with cardiology and just saw Dr. Alveta on February 25.  Subjective:   Todd [name] was seen today in follow up for parkinsonism.  Patient had an MRI brain done in [place] since our last visit at [facility].  I do not have the films, only the report.  Impressions were senescent change.  Borderline ventricular size.  No acute intracranial pathology.  Patient had neurocognitive testing with Dr. Gayland May 28, 2023 and that was unremarkable.   ALLERGIES:   Allergies  Allergen Reactions   Jardiance  [Empagliflozin ]     Yeast infections   Diphenhydramine-Phenylephrine Other (See Comments)   Lisinopril Swelling   Pseudoephedrine Hcl Other (See Comments)   Sudafed Pe Sinus Cong Day-Nght  [Diphenhydramine-Phenylephrine] Other (See Comments)   Triprolidine-Pse Rash   Actifed Cold-Sinus     rash   Pseudoephedrine     REACTION: hives   Triprolidine Hcl     CURRENT  MEDICATIONS:  No outpatient medications have been marked as taking for the 10/07/23 encounter (Appointment) with Ily Denno, [place] RAMAN, DO.     Objective:   PHYSICAL EXAMINATION:    VITALS:   There were no vitals filed for this visit.   GEN:  The patient appears stated age and is in NAD. HEENT:  Normocephalic, atraumatic.  The mucous membranes are moist. The superficial temporal arteries are without ropiness or tenderness. CV:  RRR Lungs:  CTAB Neck/HEME:  There are no carotid bruits bilaterally.  Neurological examination:  Orientation: The patient is alert and oriented x3. Cranial nerves: There is good facial symmetry with very minimal facial hypomimia. The speech is fluent and clear. Soft palate rises symmetrically and there is no tongue deviation. Hearing is intact to conversational tone. Sensation: Sensation is intact to light touch throughout Motor: Strength is at least antigravity x4.  Movement examination: Tone: There is mild increased tone in the UE, mostly on the R, but I can feel it on the left just a little bit Abnormal movements: Very rare left upper extremity resting tremor.  He did not have this with distraction procedures.  I did not see it until the very end of the visit when we were talking. Coordination:  There is mild decremation  with RAM's, with finger taps on the R Gait and Station: The patient has no difficulty arising out of a deep-seated chair without the use of the hands. The patient's stride length is good but the longer he walks the more he slightly drags the R leg.    I have reviewed and interpreted the following labs independently    Chemistry      Component Value Date/Time   NA 142 01/22/2023 1355   NA 136 01/14/2018 1146   K 4.0 01/22/2023 1355   CL 103 01/22/2023 1355   CO2 31 01/22/2023 1355   BUN 14 01/22/2023 1355   BUN 12 01/14/2018 1146   CREATININE 0.89 01/22/2023 1355   CREATININE 1.06 12/12/2015 1015      Component Value Date/Time    CALCIUM  10.0 01/22/2023 1355   ALKPHOS 40 08/20/2023 1054   AST 14 08/20/2023 1054   ALT 16 08/20/2023 1054   BILITOT 0.4 08/20/2023 1054   BILITOT <0.2 01/14/2018 1146       Lab Results  Component Value Date   WBC 6.6 02/07/2020   HGB 14.5 02/07/2020   HCT 44.0 02/07/2020   MCV 84.0 02/07/2020   PLT 271.0 02/07/2020    Lab Results  Component Value Date   TSH 1.49 11/13/2021     Total time spent on today's visit was *** minutes, including both face-to-face time and nonface-to-face time.  Time included that spent on review of records (prior notes available to me/labs/imaging if pertinent), discussing treatment and goals, answering patient's questions and coordinating care.  Cc:  Micheal Wolm ORN, MD

## 2023-10-07 ENCOUNTER — Ambulatory Visit: Admitting: Neurology

## 2023-10-21 ENCOUNTER — Telehealth: Payer: Self-pay

## 2023-10-21 NOTE — Progress Notes (Signed)
   10/21/2023  Patient ID: Todd [name], male   DOB: [birth year], 78 y.o.   MRN: [id number]  Pharmacy Quality Measure Review  This patient is appearing on a report for being at risk of failing the adherence measure for cholesterol (statin) medications this calendar year.   Medication: Rosuvastatin  10mg  Last fill date: 07/11/23 for 100 day supply  Attempted to contact patient to discuss coordinating a refill. Had to leave a voicemail  Jon VEAR Lindau, PharmD Clinical Pharmacist [phone]

## 2023-10-24 ENCOUNTER — Other Ambulatory Visit: Payer: Self-pay | Admitting: Family Medicine

## 2023-11-01 DIAGNOSIS — W57XXXA Bitten or stung by nonvenomous insect and other nonvenomous arthropods, initial encounter: Secondary | ICD-10-CM | POA: Diagnosis not present

## 2023-11-01 DIAGNOSIS — J029 Acute pharyngitis, unspecified: Secondary | ICD-10-CM | POA: Diagnosis not present

## 2023-11-01 DIAGNOSIS — J069 Acute upper respiratory infection, unspecified: Secondary | ICD-10-CM | POA: Diagnosis not present

## 2023-11-01 DIAGNOSIS — S30861A Insect bite (nonvenomous) of abdominal wall, initial encounter: Secondary | ICD-10-CM | POA: Diagnosis not present

## 2023-11-01 DIAGNOSIS — R059 Cough, unspecified: Secondary | ICD-10-CM | POA: Diagnosis not present

## 2023-11-01 DIAGNOSIS — K5900 Constipation, unspecified: Secondary | ICD-10-CM | POA: Diagnosis not present

## 2023-11-18 ENCOUNTER — Other Ambulatory Visit: Payer: Self-pay | Admitting: Family Medicine

## 2023-11-19 ENCOUNTER — Encounter: Payer: Self-pay | Admitting: Family Medicine

## 2023-11-26 ENCOUNTER — Encounter: Payer: Self-pay | Admitting: Family Medicine

## 2023-11-26 ENCOUNTER — Ambulatory Visit (INDEPENDENT_AMBULATORY_CARE_PROVIDER_SITE_OTHER): Admitting: Family Medicine

## 2023-11-26 VITALS — BP 134/74 | HR 79 | Temp 98.0°F | Wt 183.7 lb

## 2023-11-26 DIAGNOSIS — M25552 Pain in left hip: Secondary | ICD-10-CM | POA: Diagnosis not present

## 2023-11-26 DIAGNOSIS — M25551 Pain in right hip: Secondary | ICD-10-CM | POA: Diagnosis not present

## 2023-11-26 NOTE — Patient Instructions (Signed)
 Let me know if you want to set up repeat physical therapy exercises.

## 2023-11-26 NOTE — Progress Notes (Signed)
 Established Patient Office Visit  Subjective   Patient ID: Baltasar Twilley III, male    DOB: [birth year]  Age: 78 y.o. MRN: [id number]  Chief Complaint  Patient presents with   Hip Pain    HPI   Mr. Rosier is seen with bilateral hip pain right greater than left over the past month.  Denies any injury.  He states that recently he noticed that his feet seem to be turning in more meaning inversion.  He states he went to podiatrist and they suggested some shoe inserts.  He thinks he has had some lateral hip pain related to that.  No significant hip pain with ambulation.  Location is lateral hip proximal to the trochanteric bursa region.  Denies any low back pain.  He does have tremor and parkinsonism without formal diagnosis of Parkinson disease and has been followed by neurology.  He knows he has weaker muscles lower extremities and hips and struggles some with balance.  He had physical therapy as recent as 6 months ago.  Past Medical History:  Diagnosis Date   DIABETES MELLITUS, TYPE II 08/08/2009   Hiatal hernia    History of nuclear stress test    Myoview  02/2019: EF 54, apical inf and apical artifact, normal perfusion, low risk   HYPERLIPIDEMIA 08/08/2009   Palpitations    URINARY INCONTINENCE 08/08/2009   Past Surgical History:  Procedure Laterality Date   ACHILLES TENDON REPAIR  01/08/2000   rupture   APPENDECTOMY  01/07/1974   CARDIOVASCULAR STRESS TEST  11/02/2003   EF 57%   MENISCUS REPAIR Left    TONSILLECTOMY  01/08/1959   US  ECHOCARDIOGRAPHY  02/10/2001   EF 60-65%    reports that he quit smoking about 46 years ago. His smoking use included cigarettes. He started smoking about 56 years ago. He has a 20 pack-year smoking history. He has never used smokeless tobacco. He reports current alcohol use. He reports that he does not use drugs. family history includes Diabetes in his sister; Mental illness in his mother. Allergies  Allergen Reactions   Jardiance  [Empagliflozin ]      Yeast infections   Diphenhydramine-Phenylephrine Other (See Comments)   Lisinopril Swelling   Pseudoephedrine Hcl Other (See Comments)   Sudafed Pe Sinus Cong Day-Nght  [Diphenhydramine-Phenylephrine] Other (See Comments)   Triprolidine-Pse Rash   Actifed Cold-Sinus     rash   Pseudoephedrine     REACTION: hives   Triprolidine Hcl       Review of Systems  Respiratory:  Negative for shortness of breath.   Cardiovascular:  Negative for chest pain.  Musculoskeletal:  Negative for back pain.  Neurological:  Negative for sensory change and focal weakness.      Objective:     BP 134/74   Pulse 79   Temp 98 F (36.7 C) (Oral)   Wt 183 lb 11.2 oz (83.3 kg)   SpO2 97%   BMI 23.59 kg/m  BP Readings from Last 3 Encounters:  11/26/23 134/74  08/20/23 126/60  05/21/23 128/70   Wt Readings from Last 3 Encounters:  11/26/23 183 lb 11.2 oz (83.3 kg)  08/20/23 184 lb 1.6 oz (83.5 kg)  05/21/23 183 lb 11.2 oz (83.3 kg)      Physical Exam Vitals reviewed.  Constitutional:      General: He is not in acute distress.    Appearance: He is not ill-appearing.  Cardiovascular:     Rate and Rhythm: Normal rate and regular rhythm.  Pulmonary:  Effort: Pulmonary effort is normal.     Breath sounds: Normal breath sounds. No wheezing or rales.  Musculoskeletal:     Comments: He has excellent range of motion right and left hip.  No tenderness over the greater trochanteric bursa.  Neurological:     Mental Status: He is alert.     Comments: No weakness with plantarflexion, dorsiflexion, or knee extension bilaterally      No results found for any visits on 11/26/23.    The 10-year ASCVD risk score (Arnett DK, et al., 2019) is: 26.2%    Assessment & Plan:   Bilateral hip pain.  Location is near the origin of iliotibial band.  No greater trochanteric bursa tenderness.  He has excellent range of motion hips and bowel significant osteoarthritis.  In general, he has some muscle  weakness especially with gluteus muscles and hip flexors.  Suggested repeat trial physical therapy and he will consider.  He is currently living down to [place] will get back with us  if interested in setting this up.  Otherwise set up 81-month follow-up to reassess diabetes and other medical problems  Wolm Scarlet, MD

## 2023-11-27 ENCOUNTER — Encounter: Payer: Self-pay | Admitting: Family Medicine

## 2023-11-27 DIAGNOSIS — M25551 Pain in right hip: Secondary | ICD-10-CM

## 2023-12-03 ENCOUNTER — Ambulatory Visit: Admitting: Family Medicine

## 2023-12-24 ENCOUNTER — Ambulatory Visit: Admitting: Family Medicine

## 2023-12-31 ENCOUNTER — Ambulatory Visit: Admitting: Family Medicine

## 2024-01-05 ENCOUNTER — Other Ambulatory Visit: Payer: Self-pay

## 2024-01-06 ENCOUNTER — Ambulatory Visit: Admitting: Family Medicine

## 2024-01-06 MED ORDER — ROSUVASTATIN CALCIUM 10 MG PO TABS: 10.0000 mg | ORAL_TABLET | Freq: Every day | ORAL | 0 refills | Status: AC

## 2024-01-14 ENCOUNTER — Encounter: Payer: Self-pay | Admitting: Family Medicine

## 2024-01-14 ENCOUNTER — Ambulatory Visit: Admitting: Family Medicine

## 2024-01-14 VITALS — BP 136/64 | HR 64 | Temp 98.1°F | Wt 180.0 lb

## 2024-01-14 DIAGNOSIS — Z7985 Long-term (current) use of injectable non-insulin antidiabetic drugs: Secondary | ICD-10-CM | POA: Diagnosis not present

## 2024-01-14 DIAGNOSIS — E785 Hyperlipidemia, unspecified: Secondary | ICD-10-CM

## 2024-01-14 DIAGNOSIS — R634 Abnormal weight loss: Secondary | ICD-10-CM | POA: Diagnosis not present

## 2024-01-14 DIAGNOSIS — Z7984 Long term (current) use of oral hypoglycemic drugs: Secondary | ICD-10-CM

## 2024-01-14 DIAGNOSIS — E119 Type 2 diabetes mellitus without complications: Secondary | ICD-10-CM | POA: Diagnosis not present

## 2024-01-14 DIAGNOSIS — I10 Essential (primary) hypertension: Secondary | ICD-10-CM | POA: Diagnosis not present

## 2024-01-14 LAB — COMPREHENSIVE METABOLIC PANEL WITH GFR
ALT: 11 U/L (ref 3–53)
AST: 10 U/L (ref 5–37)
Albumin: 4.2 g/dL (ref 3.5–5.2)
Alkaline Phosphatase: 41 U/L (ref 39–117)
BUN: 17 mg/dL (ref 6–23)
CO2: 33 meq/L — ABNORMAL HIGH (ref 19–32)
Calcium: 9.9 mg/dL (ref 8.4–10.5)
Chloride: 103 meq/L (ref 96–112)
Creatinine, Ser: 0.87 mg/dL (ref 0.40–1.50)
GFR: 83.33 mL/min
Glucose, Bld: 106 mg/dL — ABNORMAL HIGH (ref 70–99)
Potassium: 4 meq/L (ref 3.5–5.1)
Sodium: 141 meq/L (ref 135–145)
Total Bilirubin: 0.3 mg/dL (ref 0.2–1.2)
Total Protein: 7.3 g/dL (ref 6.0–8.3)

## 2024-01-14 LAB — LIPID PANEL
Cholesterol: 142 mg/dL (ref 28–200)
HDL: 50.9 mg/dL
LDL Cholesterol: 77 mg/dL (ref 10–99)
NonHDL: 91.26
Total CHOL/HDL Ratio: 3
Triglycerides: 71 mg/dL (ref 10.0–149.0)
VLDL: 14.2 mg/dL (ref 0.0–40.0)

## 2024-01-14 LAB — POCT GLYCOSYLATED HEMOGLOBIN (HGB A1C): Hemoglobin A1C: 6.8 % — AB (ref 4.0–5.6)

## 2024-01-14 LAB — MICROALBUMIN / CREATININE URINE RATIO
Creatinine,U: 125.9 mg/dL
Microalb Creat Ratio: UNDETERMINED mg/g (ref 0.0–30.0)
Microalb, Ur: <0.7 mg/dL

## 2024-01-14 NOTE — Progress Notes (Signed)
 "  Established Patient Office Visit  Subjective   Patient ID: Todd [name], male    DOB: 07/09/1945  Age: 79 y.o. MRN: [id number]  Chief Complaint  Patient presents with   Medical Management of Chronic Issues    HPI   Todd [name] is seen for medical follow-up.  He recently establish new cardiology care in [place].  He has history of hypertension, PVCs, type 2 diabetes, dyslipidemia.  Current medications include fenofibrate , finasteride, metformin , Mounjaro  5 mg, rosuvastatin , Vesicare.  His last A1c was 6.2%.  He had been having some significant weight loss and had requested a reduction in Mounjaro  dosage.  We reduce this to 5 mg which he is taking every other week and is still lost a few more pounds.  He is trying to be conscious to eat regularly.  He has been working with a physical therapist for some strength training.  Doing some band exercises.  He takes low-dose rosuvastatin  10 mg daily for hyperlipidemia.  No myalgias.  Needs follow-up labs.  Past Medical History:  Diagnosis Date   DIABETES MELLITUS, TYPE II 08/08/2009   Hiatal hernia    History of nuclear stress test    Myoview  02/2019: EF 54, apical inf and apical artifact, normal perfusion, low risk   HYPERLIPIDEMIA 08/08/2009   Palpitations    URINARY INCONTINENCE 08/08/2009   Past Surgical History:  Procedure Laterality Date   ACHILLES TENDON REPAIR  01/08/2000   rupture   APPENDECTOMY  01/07/1974   CARDIOVASCULAR STRESS TEST  11/02/2003   EF 57%   MENISCUS REPAIR Left    TONSILLECTOMY  01/08/1959   US  ECHOCARDIOGRAPHY  02/10/2001   EF 60-65%    reports that he quit smoking about 46 years ago. His smoking use included cigarettes. He started smoking about 56 years ago. He has a 20 pack-year smoking history. He has never used smokeless tobacco. He reports current alcohol use. He reports that he does not use drugs. family history includes Diabetes in his sister; Mental illness in his mother. Allergies[1]  Review of Systems   Constitutional:  Positive for weight loss. Negative for malaise/fatigue.  Eyes:  Negative for blurred vision.  Respiratory:  Negative for shortness of breath.   Cardiovascular:  Negative for chest pain.  Gastrointestinal:  Negative for abdominal pain, blood in stool, diarrhea, nausea and vomiting.  Neurological:  Negative for dizziness, weakness and headaches.      Objective:     BP 136/64   Pulse 64   Temp 98.1 F (36.7 C) (Oral)   Wt 180 lb (81.6 kg)   SpO2 94%   BMI 23.11 kg/m  BP Readings from Last 3 Encounters:  01/14/24 136/64  11/26/23 134/74  08/20/23 126/60   Wt Readings from Last 3 Encounters:  01/14/24 180 lb (81.6 kg)  11/26/23 183 lb 11.2 oz (83.3 kg)  08/20/23 184 lb 1.6 oz (83.5 kg)      Physical Exam Vitals reviewed.  Constitutional:      General: He is not in acute distress.    Appearance: He is well-developed. He is not ill-appearing.  HENT:     Right Ear: External ear normal.     Left Ear: External ear normal.  Eyes:     Pupils: Pupils are equal, round, and reactive to light.  Neck:     Thyroid : No thyromegaly.  Cardiovascular:     Rate and Rhythm: Normal rate and regular rhythm.  Pulmonary:     Effort: Pulmonary effort is normal. No  respiratory distress.     Breath sounds: Normal breath sounds. No wheezing or rales.  Musculoskeletal:     Cervical back: Neck supple.     Right lower leg: No edema.     Left lower leg: No edema.  Skin:    Comments: Feet reveal no skin lesions. Good distal foot pulses. Good capillary refill. No calluses. Normal sensation with monofilament testing   Neurological:     Mental Status: He is alert and oriented to person, place, and time.      Results for orders placed or performed in visit on 01/14/24  POC HgB A1c  Result Value Ref Range   Hemoglobin A1C 6.8 (A) 4.0 - 5.6 %   HbA1c POC (<> result, manual entry)     HbA1c, POC (prediabetic range)     HbA1c, POC (controlled diabetic range)        The  10-year ASCVD risk score (Arnett DK, et al., 2019) is: 26.8%    Assessment & Plan:   #1 type 2 diabetes controlled with A1c 6.8%.  He is on metformin  and takes Mounjaro  5 mg every other week.  Had some weight loss with Mounjaro  and has continued to lose slowly.  We did discuss other possible options or alternatives to Mounjaro  such as insulin but at this point he prefers to give this 3 more months and reassessing weight and A1c at that time and going from there.  #2 hyperlipidemia treated with rosuvastatin .  Recheck lipid and hepatic panel  #3 hypertension stable and controlled.  Continue low-sodium diet.  Blood pressure currently controlled off blood pressure medications  #4 modest weight loss is probably related to GLP-1.  Discussed importance of adequate calories and especially protein and resistance training.  Reassess in 3 months.  If still losing at that point we will consider discontinuation of Mounjaro    Wolm Scarlet, MD     [1]  Allergies Allergen Reactions   Jardiance  [Empagliflozin ]     Yeast infections   Diphenhydramine-Phenylephrine Other (See Comments)   Lisinopril Swelling   Pseudoephedrine Hcl Other (See Comments)   Sudafed Pe Sinus Cong Day-Nght  [Diphenhydramine-Phenylephrine] Other (See Comments)   Triprolidine-Pse Rash   Actifed Cold-Sinus     rash   Pseudoephedrine     REACTION: hives   Triprolidine Hcl    "

## 2024-01-15 ENCOUNTER — Ambulatory Visit: Payer: Self-pay | Admitting: Family Medicine

## 2024-01-27 LAB — OPHTHALMOLOGY REPORT-SCANNED

## 2024-04-08 ENCOUNTER — Ambulatory Visit: Admitting: Neurology

## 2024-05-05 ENCOUNTER — Ambulatory Visit

## 2024-05-12 ENCOUNTER — Ambulatory Visit: Admitting: Family Medicine
